# Patient Record
Sex: Male | Born: 1940 | Race: White | Hispanic: No | Marital: Single | Smoking: Former smoker
Health system: Southern US, Community
[De-identification: ages and names within clinical notes are randomized; demographics above are authoritative.]

## PROBLEM LIST (undated history)

## (undated) DIAGNOSIS — G8929 Other chronic pain: Secondary | ICD-10-CM

## (undated) DIAGNOSIS — I10 Essential (primary) hypertension: Secondary | ICD-10-CM

## (undated) DIAGNOSIS — I639 Cerebral infarction, unspecified: Secondary | ICD-10-CM

## (undated) DIAGNOSIS — R569 Unspecified convulsions: Secondary | ICD-10-CM

## (undated) DIAGNOSIS — M549 Dorsalgia, unspecified: Secondary | ICD-10-CM

## (undated) DIAGNOSIS — F32A Depression, unspecified: Secondary | ICD-10-CM

## (undated) DIAGNOSIS — S62502A Fracture of unspecified phalanx of left thumb, initial encounter for closed fracture: Secondary | ICD-10-CM

## (undated) DIAGNOSIS — F329 Major depressive disorder, single episode, unspecified: Secondary | ICD-10-CM

## (undated) DIAGNOSIS — C61 Malignant neoplasm of prostate: Secondary | ICD-10-CM

## (undated) DIAGNOSIS — J449 Chronic obstructive pulmonary disease, unspecified: Secondary | ICD-10-CM

## (undated) DIAGNOSIS — R0602 Shortness of breath: Secondary | ICD-10-CM

## (undated) HISTORY — DX: Fracture of unspecified phalanx of left thumb, initial encounter for closed fracture: S62.502A

## (undated) HISTORY — DX: Dorsalgia, unspecified: M54.9

## (undated) HISTORY — PX: LUMBAR SPINE SURGERY: SHX701

## (undated) HISTORY — DX: Other chronic pain: G89.29

## (undated) HISTORY — DX: Malignant neoplasm of prostate: C61

---

## 2004-07-28 ENCOUNTER — Encounter: Admission: RE | Admit: 2004-07-28 | Discharge: 2004-07-28 | Payer: Self-pay | Admitting: Internal Medicine

## 2004-08-24 ENCOUNTER — Ambulatory Visit (HOSPITAL_COMMUNITY): Admission: RE | Admit: 2004-08-24 | Discharge: 2004-08-24 | Payer: Self-pay | Admitting: Thoracic Surgery

## 2004-09-08 ENCOUNTER — Ambulatory Visit (HOSPITAL_COMMUNITY): Admission: RE | Admit: 2004-09-08 | Discharge: 2004-09-08 | Payer: Self-pay | Admitting: Thoracic Surgery

## 2004-10-12 ENCOUNTER — Inpatient Hospital Stay (HOSPITAL_COMMUNITY): Admission: RE | Admit: 2004-10-12 | Discharge: 2004-10-16 | Payer: Self-pay | Admitting: Thoracic Surgery

## 2004-10-12 ENCOUNTER — Encounter (INDEPENDENT_AMBULATORY_CARE_PROVIDER_SITE_OTHER): Payer: Self-pay | Admitting: *Deleted

## 2004-10-25 ENCOUNTER — Encounter: Admission: RE | Admit: 2004-10-25 | Discharge: 2004-10-25 | Payer: Self-pay | Admitting: Thoracic Surgery

## 2004-11-07 ENCOUNTER — Encounter: Admission: RE | Admit: 2004-11-07 | Discharge: 2004-11-07 | Payer: Self-pay | Admitting: Thoracic Surgery

## 2004-12-20 ENCOUNTER — Encounter: Admission: RE | Admit: 2004-12-20 | Discharge: 2004-12-20 | Payer: Self-pay | Admitting: Thoracic Surgery

## 2005-02-28 ENCOUNTER — Encounter: Admission: RE | Admit: 2005-02-28 | Discharge: 2005-02-28 | Payer: Self-pay | Admitting: Thoracic Surgery

## 2005-08-29 ENCOUNTER — Encounter: Admission: RE | Admit: 2005-08-29 | Discharge: 2005-08-29 | Payer: Self-pay | Admitting: Thoracic Surgery

## 2005-12-11 ENCOUNTER — Emergency Department (HOSPITAL_COMMUNITY): Admission: EM | Admit: 2005-12-11 | Discharge: 2005-12-11 | Payer: Self-pay | Admitting: Emergency Medicine

## 2006-06-14 ENCOUNTER — Ambulatory Visit: Admission: RE | Admit: 2006-06-14 | Discharge: 2006-09-04 | Payer: Self-pay | Admitting: Radiation Oncology

## 2006-09-18 ENCOUNTER — Ambulatory Visit (HOSPITAL_BASED_OUTPATIENT_CLINIC_OR_DEPARTMENT_OTHER): Admission: RE | Admit: 2006-09-18 | Discharge: 2006-09-18 | Payer: Self-pay | Admitting: Urology

## 2006-10-11 ENCOUNTER — Ambulatory Visit: Admission: RE | Admit: 2006-10-11 | Discharge: 2006-11-12 | Payer: Self-pay | Admitting: Radiation Oncology

## 2007-02-25 ENCOUNTER — Ambulatory Visit: Payer: Self-pay | Admitting: Surgery

## 2007-02-25 ENCOUNTER — Encounter (INDEPENDENT_AMBULATORY_CARE_PROVIDER_SITE_OTHER): Payer: Self-pay | Admitting: Internal Medicine

## 2007-02-25 ENCOUNTER — Ambulatory Visit (HOSPITAL_COMMUNITY): Admission: RE | Admit: 2007-02-25 | Discharge: 2007-02-25 | Payer: Self-pay | Admitting: Internal Medicine

## 2008-01-06 ENCOUNTER — Ambulatory Visit: Payer: Self-pay | Admitting: Internal Medicine

## 2008-01-06 ENCOUNTER — Inpatient Hospital Stay (HOSPITAL_COMMUNITY): Admission: EM | Admit: 2008-01-06 | Discharge: 2008-01-11 | Payer: Self-pay | Admitting: Emergency Medicine

## 2008-01-07 ENCOUNTER — Encounter: Payer: Self-pay | Admitting: Internal Medicine

## 2008-01-08 ENCOUNTER — Encounter: Payer: Self-pay | Admitting: Internal Medicine

## 2008-01-10 ENCOUNTER — Encounter: Payer: Self-pay | Admitting: Internal Medicine

## 2009-02-05 ENCOUNTER — Emergency Department (HOSPITAL_COMMUNITY): Admission: EM | Admit: 2009-02-05 | Discharge: 2009-02-05 | Payer: Self-pay | Admitting: Emergency Medicine

## 2009-02-08 ENCOUNTER — Encounter: Admission: RE | Admit: 2009-02-08 | Discharge: 2009-02-08 | Payer: Self-pay | Admitting: Neurosurgery

## 2009-02-28 ENCOUNTER — Ambulatory Visit: Payer: Self-pay | Admitting: Cardiovascular Disease

## 2009-02-28 ENCOUNTER — Inpatient Hospital Stay (HOSPITAL_COMMUNITY): Admission: EM | Admit: 2009-02-28 | Discharge: 2009-03-07 | Payer: Self-pay | Admitting: Emergency Medicine

## 2009-03-01 ENCOUNTER — Encounter: Payer: Self-pay | Admitting: Cardiology

## 2009-03-02 ENCOUNTER — Ambulatory Visit: Payer: Self-pay | Admitting: Cardiology

## 2009-03-02 ENCOUNTER — Encounter: Payer: Self-pay | Admitting: Cardiology

## 2009-03-15 ENCOUNTER — Encounter: Admission: RE | Admit: 2009-03-15 | Discharge: 2009-03-15 | Payer: Self-pay | Admitting: Neurosurgery

## 2009-04-07 ENCOUNTER — Encounter: Admission: RE | Admit: 2009-04-07 | Discharge: 2009-04-07 | Payer: Self-pay | Admitting: Neurosurgery

## 2009-06-30 ENCOUNTER — Encounter: Admission: RE | Admit: 2009-06-30 | Discharge: 2009-06-30 | Payer: Self-pay | Admitting: Neurosurgery

## 2009-07-19 ENCOUNTER — Inpatient Hospital Stay (HOSPITAL_COMMUNITY): Admission: EM | Admit: 2009-07-19 | Discharge: 2009-07-20 | Payer: Self-pay | Admitting: Internal Medicine

## 2009-11-12 IMAGING — CR DG CHEST 1V PORT
1 series · 1 of 1 positions shown · non-contrast
Comparison: 01/06/2008

CLINICAL DATA: GI bleeding.  Anemia.  Fall.  Coagulopathy due to
coumadin.  COPD.  No shortness of breath following breathing
treatment.

PORTABLE CHEST - 1 VIEW

[view not recorded]
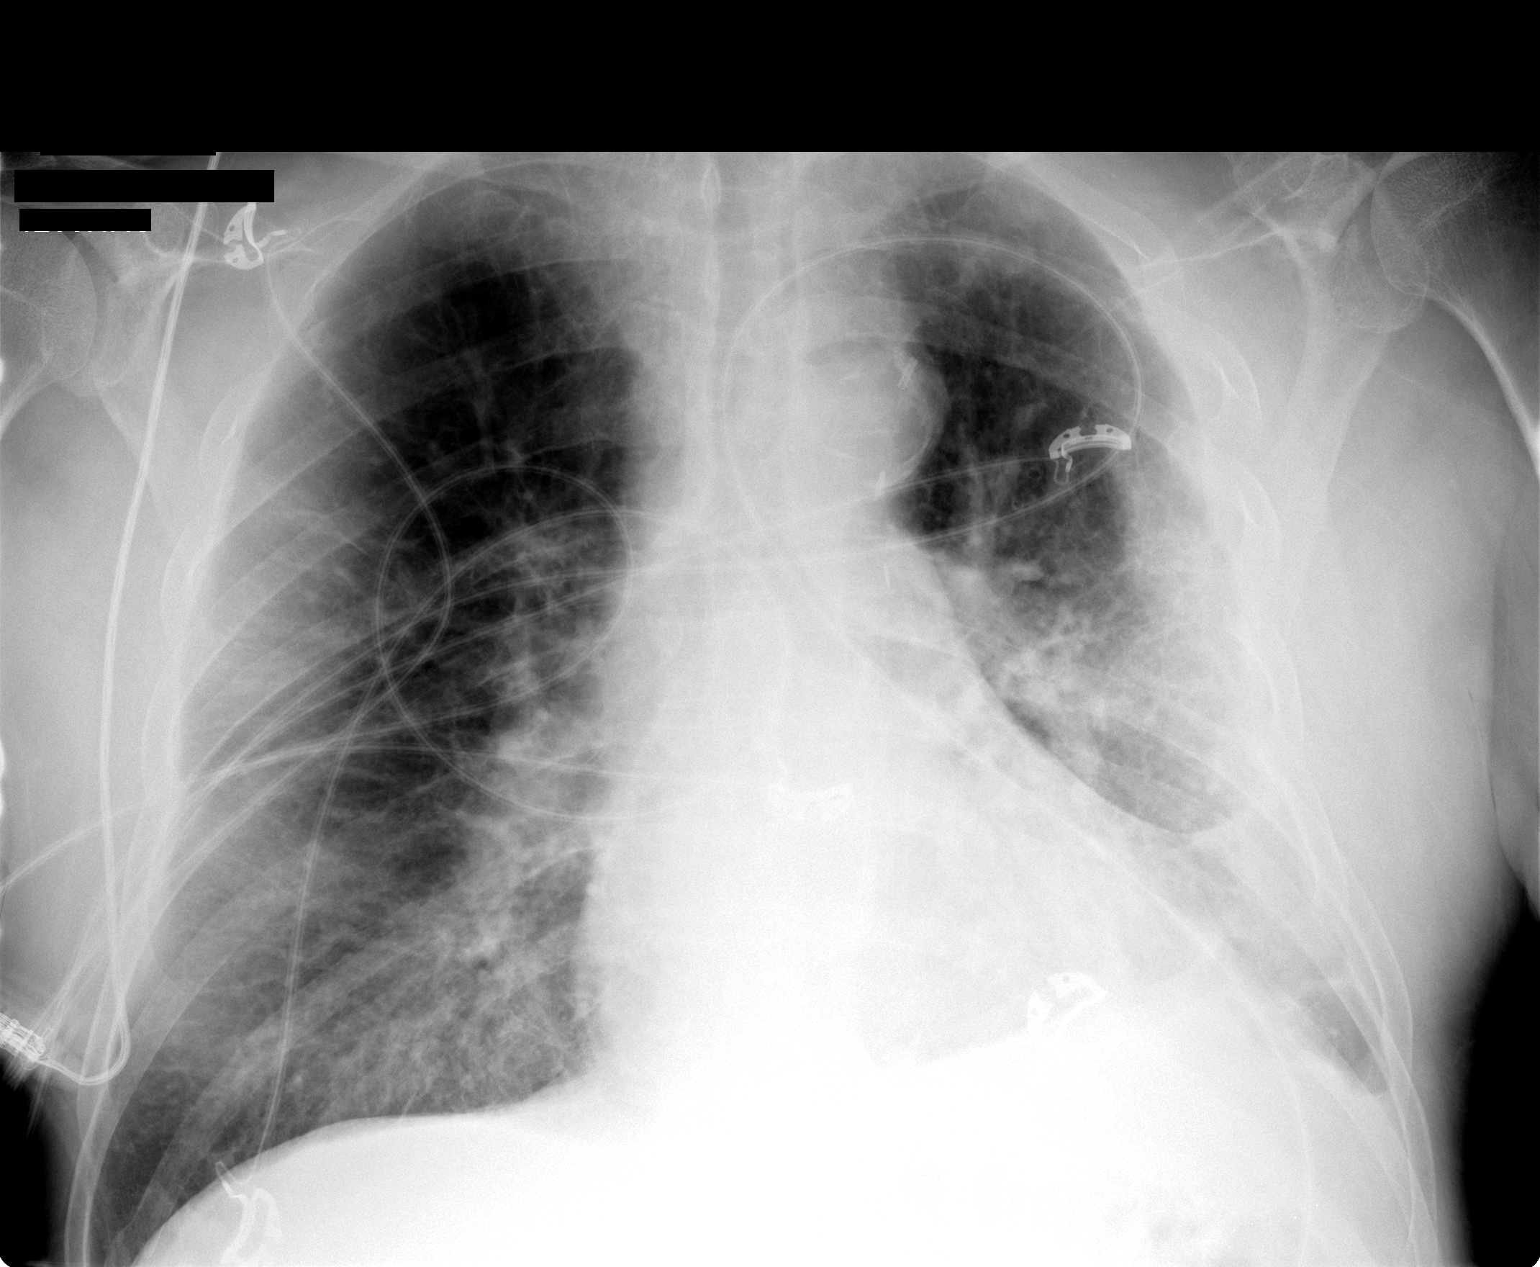

[1 of 1 positions shown; findings below may reference images not displayed]

FINDINGS: Lungs are hyperinflated.  Heart is enlarged.  No
pulmonary edema.  Patchy infiltrates are identified, increased
since prior study.  These are seen in the right lung base and left
lower lobe and left mid lung zone.  Small left pleural effusion is
identified.
IMPRESSION: 1.  COPD.
2.  Increased pulmonary infiltrates in the left lung and right
lower lobe.

## 2009-11-15 ENCOUNTER — Encounter: Admission: RE | Admit: 2009-11-15 | Discharge: 2009-11-15 | Payer: Self-pay | Admitting: Neurosurgery

## 2010-01-25 ENCOUNTER — Encounter: Payer: Self-pay | Admitting: Internal Medicine

## 2010-02-14 ENCOUNTER — Encounter: Payer: Self-pay | Admitting: Internal Medicine

## 2010-02-26 ENCOUNTER — Encounter: Payer: Self-pay | Admitting: Thoracic Surgery

## 2010-03-07 NOTE — Consult Note (Signed)
Summary: [facility]   Imported By: Roderic Ovens 03/04/2009 14:30:58  _____________________________________________________________________  External Attachment:    Type:   Image     Comment:   External Document

## 2010-03-09 NOTE — Letter (Signed)
Summary: Endoscopy Letter  [facility]  [address] [place], [place] [address]   Phone: [phone]  Fax: [phone]      February 14, 2010 MRN: [id number]   Willie Ward [address] [place], [place]  [address]   Dear Mr. BURNHAM,   According to your medical record, it is time for you to schedule an Endoscopy. Endoscopic screening is recommended for patients with certain upper digestive tract conditions because of associated increased risk for cancers of the upper digestive system.  This letter has been generated based on the recommendations made at the time of your prior procedure. If you feel that in your particular situation this may no longer apply, please contact our office.  Please call our office at [phone]) to schedule this appointment or to update your records at your earliest convenience.  Thank you for cooperating with [place] to provide you with the very best care possible.   Sincerely,  Hedwig Morton. Juanda Chance, M.D.  [facility] [phone]

## 2010-03-09 NOTE — Procedures (Signed)
Summary: Recall Assessment/[facility]  Recall Assessment/[facility]   Imported By: Sherian Rein 02/20/2010 15:49:38  _____________________________________________________________________  External Attachment:    Type:   Image     Comment:   External Document

## 2010-04-23 LAB — CBC
HCT: 33.5 % — ABNORMAL LOW (ref 39.0–52.0)
HCT: 36.9 % — ABNORMAL LOW (ref 39.0–52.0)
HCT: 38.6 % — ABNORMAL LOW (ref 39.0–52.0)
HCT: 41.7 % (ref 39.0–52.0)
Hemoglobin: 12.2 g/dL — ABNORMAL LOW (ref 13.0–17.0)
Hemoglobin: 12.7 g/dL — ABNORMAL LOW (ref 13.0–17.0)
Hemoglobin: 13.9 g/dL (ref 13.0–17.0)
MCHC: 32.7 g/dL (ref 30.0–36.0)
MCHC: 32.9 g/dL (ref 30.0–36.0)
MCHC: 33.3 g/dL (ref 30.0–36.0)
MCV: 111.1 fL — ABNORMAL HIGH (ref 78.0–100.0)
MCV: 112.4 fL — ABNORMAL HIGH (ref 78.0–100.0)
MCV: 111.2 fL — ABNORMAL HIGH (ref 78.0–100.0)
Platelets: 106 10*3/uL — ABNORMAL LOW (ref 150–400)
Platelets: 128 10*3/uL — ABNORMAL LOW (ref 150–400)
Platelets: 139 10*3/uL — ABNORMAL LOW (ref 150–400)
Platelets: 150 10*3/uL (ref 150–400)
RBC: 3.13 MIL/uL — ABNORMAL LOW (ref 4.22–5.81)
RBC: 3.28 MIL/uL — ABNORMAL LOW (ref 4.22–5.81)
RBC: 3.48 MIL/uL — ABNORMAL LOW (ref 4.22–5.81)
RBC: 3.71 MIL/uL — ABNORMAL LOW (ref 4.22–5.81)
RBC: 3.75 MIL/uL — ABNORMAL LOW (ref 4.22–5.81)
RDW: 13.6 % (ref 11.5–15.5)
RDW: 14.4 % (ref 11.5–15.5)
RDW: 15.1 % (ref 11.5–15.5)
WBC: 3.5 10*3/uL — ABNORMAL LOW (ref 4.0–10.5)
WBC: 3.9 10*3/uL — ABNORMAL LOW (ref 4.0–10.5)
WBC: 4.6 10*3/uL (ref 4.0–10.5)
WBC: 4.7 10*3/uL (ref 4.0–10.5)
WBC: 6.2 10*3/uL (ref 4.0–10.5)

## 2010-04-23 LAB — COMPREHENSIVE METABOLIC PANEL
ALT: 16 U/L (ref 0–53)
ALT: 29 U/L (ref 0–53)
ALT: 56 U/L — ABNORMAL HIGH (ref 0–53)
ALT: 26 U/L (ref 0–53)
AST: 18 U/L (ref 0–37)
AST: 190 U/L — ABNORMAL HIGH (ref 0–37)
AST: 38 U/L — ABNORMAL HIGH (ref 0–37)
AST: 46 U/L — ABNORMAL HIGH (ref 0–37)
AST: 87 U/L — ABNORMAL HIGH (ref 0–37)
Albumin: 2.5 g/dL — ABNORMAL LOW (ref 3.5–5.2)
Albumin: 2.5 g/dL — ABNORMAL LOW (ref 3.5–5.2)
Albumin: 2.7 g/dL — ABNORMAL LOW (ref 3.5–5.2)
Albumin: 2.8 g/dL — ABNORMAL LOW (ref 3.5–5.2)
Albumin: 3.4 g/dL — ABNORMAL LOW (ref 3.5–5.2)
Alkaline Phosphatase: 59 U/L (ref 39–117)
Alkaline Phosphatase: 61 U/L (ref 39–117)
Alkaline Phosphatase: 68 U/L (ref 39–117)
BUN: 3 mg/dL — ABNORMAL LOW (ref 6–23)
BUN: 3 mg/dL — ABNORMAL LOW (ref 6–23)
BUN: 6 mg/dL (ref 6–23)
BUN: 8 mg/dL (ref 6–23)
BUN: 5 mg/dL — ABNORMAL LOW (ref 6–23)
CO2: 29 mEq/L (ref 19–32)
CO2: 31 mEq/L (ref 19–32)
CO2: 32 mEq/L (ref 19–32)
CO2: 37 mEq/L — ABNORMAL HIGH (ref 19–32)
Calcium: 8.1 mg/dL — ABNORMAL LOW (ref 8.4–10.5)
Calcium: 8.3 mg/dL — ABNORMAL LOW (ref 8.4–10.5)
Calcium: 8.4 mg/dL (ref 8.4–10.5)
Calcium: 8.6 mg/dL (ref 8.4–10.5)
Calcium: 9 mg/dL (ref 8.4–10.5)
Calcium: 7.8 mg/dL — ABNORMAL LOW (ref 8.4–10.5)
Chloride: 100 mEq/L (ref 96–112)
Chloride: 107 mEq/L (ref 96–112)
Chloride: 93 mEq/L — ABNORMAL LOW (ref 96–112)
Chloride: 99 mEq/L (ref 96–112)
Creatinine, Ser: 0.89 mg/dL (ref 0.4–1.5)
Creatinine, Ser: 0.92 mg/dL (ref 0.4–1.5)
Creatinine, Ser: 1.22 mg/dL (ref 0.4–1.5)
Creatinine, Ser: 0.76 mg/dL (ref 0.4–1.5)
GFR calc Af Amer: 46 mL/min — ABNORMAL LOW (ref >60)
GFR calc Af Amer: >60 mL/min (ref >60)
GFR calc Af Amer: >60 mL/min (ref >60)
GFR calc Af Amer: >60 mL/min (ref >60)
GFR calc non Af Amer: >60 mL/min (ref >60)
GFR calc non Af Amer: >60 mL/min (ref >60)
GFR calc non Af Amer: >60 mL/min (ref >60)
GFR calc non Af Amer: >60 mL/min (ref >60)
GFR calc non Af Amer: >60 mL/min (ref >60)
GFR calc non Af Amer: >60 mL/min (ref >60)
Glucose, Bld: 109 mg/dL — ABNORMAL HIGH (ref 70–99)
Glucose, Bld: 98 mg/dL (ref 70–99)
Potassium: 2.7 mEq/L — CL (ref 3.5–5.1)
Potassium: 3 mEq/L — ABNORMAL LOW (ref 3.5–5.1)
Potassium: 3.4 mEq/L — ABNORMAL LOW (ref 3.5–5.1)
Potassium: 4.3 mEq/L (ref 3.5–5.1)
Potassium: 4.7 mEq/L (ref 3.5–5.1)
Sodium: 137 mEq/L (ref 135–145)
Sodium: 140 mEq/L (ref 135–145)
Sodium: 132 mEq/L — ABNORMAL LOW (ref 135–145)
Total Bilirubin: 1.1 mg/dL (ref 0.3–1.2)
Total Bilirubin: 1.1 mg/dL (ref 0.3–1.2)
Total Bilirubin: 1.2 mg/dL (ref 0.3–1.2)
Total Bilirubin: 1.6 mg/dL — ABNORMAL HIGH (ref 0.3–1.2)
Total Bilirubin: 2.3 mg/dL — ABNORMAL HIGH (ref 0.3–1.2)
Total Protein: 5.2 g/dL — ABNORMAL LOW (ref 6.0–8.3)
Total Protein: 5.4 g/dL — ABNORMAL LOW (ref 6.0–8.3)
Total Protein: 5.9 g/dL — ABNORMAL LOW (ref 6.0–8.3)

## 2010-04-23 LAB — BASIC METABOLIC PANEL
BUN: 5 mg/dL — ABNORMAL LOW (ref 6–23)
BUN: 3 mg/dL — ABNORMAL LOW (ref 6–23)
Chloride: 96 mEq/L (ref 96–112)
GFR calc Af Amer: >60 mL/min (ref >60)
GFR calc non Af Amer: >60 mL/min (ref >60)
GFR calc non Af Amer: >60 mL/min (ref >60)
Potassium: 3.5 mEq/L (ref 3.5–5.1)
Potassium: 3.8 mEq/L (ref 3.5–5.1)
Sodium: 135 mEq/L (ref 135–145)

## 2010-04-23 LAB — CK TOTAL AND CKMB (NOT AT ARMC)
CK, MB: 8.3 ng/mL (ref 0.3–4.0)
Relative Index: INVALID (ref 0.0–2.5)
Relative Index: INVALID (ref 0.0–2.5)
Total CK: 59 U/L (ref 7–232)
Total CK: 62 U/L (ref 7–232)

## 2010-04-23 LAB — DIFFERENTIAL
Basophils Absolute: 0 10*3/uL (ref 0.0–0.1)
Basophils Relative: 0 % (ref 0–1)
Eosinophils Absolute: 0 10*3/uL (ref 0.0–0.7)
Lymphocytes Relative: 3 % — ABNORMAL LOW (ref 12–46)
Monocytes Relative: 6 % (ref 3–12)
Neutrophils Relative %: 91 % — ABNORMAL HIGH (ref 43–77)

## 2010-04-23 LAB — POCT CARDIAC MARKERS: Myoglobin, poc: 111 ng/mL (ref 12–200)

## 2010-04-23 LAB — HEPATITIS PANEL, ACUTE
Hep B C IgM: NEGATIVE
Hepatitis B Surface Ag: NEGATIVE

## 2010-04-23 LAB — RAPID URINE DRUG SCREEN, HOSP PERFORMED
Opiates: POSITIVE — AB
Tetrahydrocannabinol: NOT DETECTED

## 2010-04-23 LAB — CARDIAC PANEL(CRET KIN+CKTOT+MB+TROPI)
CK, MB: 8.7 ng/mL (ref 0.3–4.0)
Relative Index: INVALID (ref 0.0–2.5)
Total CK: 64 U/L (ref 7–232)

## 2010-04-23 LAB — MAGNESIUM: Magnesium: 1.3 mg/dL — ABNORMAL LOW (ref 1.5–2.5)

## 2010-04-23 LAB — TROPONIN I: Troponin I: 1 ng/mL (ref 0.00–0.06)

## 2010-04-24 LAB — MRSA PCR SCREENING: MRSA by PCR: NEGATIVE

## 2010-06-20 NOTE — H&P (Signed)
Willie Ward, Willie Ward NO.:  [id number]   MEDICAL RECORD NO.:  [id number]          PATIENT TYPE:  INP   LOCATION:  1237                         FACILITY:  [facility]   PHYSICIAN:  Gwen Pounds, MD       DATE OF BIRTH:  1940/06/14   DATE OF ADMISSION:  01/05/2008  DATE OF DISCHARGE:                              HISTORY & PHYSICAL   PRIMARY CARE Kelyn Koskela:  w. Eric Form, M.D.   CHIEF COMPLAINT:  Very ill and orthostatic.   HISTORY OF PRESENT ILLNESS:  A 70 year old male with multiple medical  problems, currently his wife in the hospital with cancer of the liver in  room 1344.  He is very worried about by his wife.  He has not taken care  of himself for at least 3 weeks, minimal food, minimal water but has  been continuing to take his medications.  In the last 3 weeks, he has  had minimal to no reported alcohol.  In last one to one and half  weeks, he has had increased falls, orthostasis, headache, numbness, left  arm pain, left shoulder pain, and left elbow pain.  Because of  equilibrium and balance, he came to the ED.  He did take milk of  magnesia yesterday secondary to constipation and had diarrhea.  He hit  his head this afternoon and had a laceration and bleeding of the  forehead.  He got to the emergency department.  I was called for  inpatient admission due to him looking disheveled and ill.  He is  hypotensive.  He has got a bleeding laceration on the scalp.  He is in  renal failure.  He is anemic.  He has got a lower GI bleed and has  supratherapeutic INR.   PAST MEDICAL HISTORY:  1. Low back pain.  2. DVT.  3. Hypertension.  4. History of dysphagia secondary esophageal dilatation.  5. GERD.  6. Hiatal hernia.  7. COPD.  8. Former heavy smoker.  9. Prostate cancer status post treatment with Dr. Retta Diones.  10.L4 laminectomy.  11.History of rib surgery.   ALLERGIES:  No known drug allergies.   MEDICATION LIST:  Atenolol, Coumadin, lisinopril, Lasix,  albuterol  p.r.n., Prilosec p.r.n., and pain medication.   SOCIAL HISTORY:  He is married, 4 children.  He quit tobacco after 25  years, minimal alcohol.   FAMILY HISTORY:  Mother died of old age.  Father died of bowel  obstruction.   REVIEW OF SYSTEMS:  Urine output is stable, but he has to sit down to  urinate and does have markedly decreased stream and he says his urine  output over the last couple of days has been okay.  Please see HPI for  further details.  He denies any recent overdosing if ibuprofen.  No  blood in the stool.  No fevers, no chills, no other symptoms are noted,  except he has not been able to care for himself.   PHYSICAL EXAMINATION:  VITAL SIGNS:  Blood pressure 77/41 and 81/57,  heart rate 80, respiratory rate 18, and saturating 97% on  1 L nasal  cannula.  GENERAL:  He is alert and oriented, which is very surprising.  HEENT:  He has got very poor dentition.  His forehead is bloody and has  a dressing.  Tongue is very dry.  NECK:  No JVD.  PULMONARY:  Clear to auscultation bilaterally with end-respiratory  wheeze.  CARDIAC:  Regular without murmur.  ABDOMEN:  Soft.  He reports mild umbilical tenderness.  EXTREMITIES:  No edema.  He is moving all 4.  He has got some back scars  noted.   ANCILLARY DATA:  White count 4.6, hemoglobin 8.5, and platelet count  246.  Sodium 136, potassium 3.3, chloride 92, bicarb not drawn, BUN 77,  creatinine 3.1, and glucose 95.  Alcohol less than 0.5.  INR greater  than 9.8.  Occult blood is positive.  Chest x-ray shows COPD, history of  left rib and surgical changes, calcified pleural plaques, otherwise  clear.  Cranial CT; no acute intracranial abnormality, atrophy, left  maxillary sinusitis, and cervical disk change is noted.   ASSESSMENT AND PLAN:  This is a 70 year old man who is disheveled,  looked much older than stated age, being admitted with internal  gastrointestinal bleed, upper or versus lower hard to determine  at this  point.  Acute renal failure, hypotension, orthostasis, failure to  thrive, dehydration, supratherapeutic INR, and not caring for self.  Denies suicidal tendencies.  He denies any current alcohol and he is  critically ill and will be admitted to the ICU.  Baseline BUN and  creatinine are 6 and 0.9 respectively dated September 11, 2006.   PLAN:  1. Admit to ICU.  2. Aggressive blood and volume repletion.  3. Reverse of Coumadin.  4. Start Zoloft in 1 to 2 days.  5. K-Dur 20 mEq p.o. x1.  6. Proton pump inhibition.  7. Keep the patient warm, he is very cold.  8. Follow labs.  9. Keep forehead dressed.  10.Empiric Rocephin x1 secondary to poor teeth and question sinusitis      on the CT scan.  11.Nebulizer treatment.  12.Oxygen.      Gwen Pounds, MD  Electronically Signed     JMR/MEDQ  D:  01/06/2008  T:  01/06/2008  Job:  [id number]   cc:   Dr. Clelia Croft

## 2010-06-20 NOTE — Op Note (Signed)
Willie Ward, Willie Ward               ACCOUNT NO.:  [id number]   MEDICAL RECORD NO.:  [id number]          PATIENT TYPE:  AMB   LOCATION:  NESC                         FACILITY:  [facility]   PHYSICIAN:  Bertram Millard. Dahlstedt, M.D.DATE OF BIRTH:  Jun 26, 1940   DATE OF PROCEDURE:  09/18/2006  DATE OF DISCHARGE:                               OPERATIVE REPORT   PREOPERATIVE DIAGNOSIS:  Adenocarcinoma prostate.   POSTOPERATIVE DIAGNOSIS:  Adenocarcinoma prostate.   PROCEDURE:  Placement of I-125 seeds, cystoscopy.   SURGEON:  Bertram Millard. Dahlstedt, M.D.   RADIATION ONCOLOGIST:  Artist Pais. Kathrynn Running, M.D.   ANESTHESIA:  General with LMA.   COMPLICATIONS:  None.   ESTIMATED BLOOD LOSS:  Minimal.   BRIEF HISTORY:  A 70 year old male who was diagnosed this spring with  adenocarcinoma of the prostate.  The initial PSA was 6.5, he had a  Gleason score 3+4.  He has completed external beam radiotherapy and  presents at this time for brachytherapy for completion of combination  radiotherapy.  Risks and complications of the procedure have been  discussed with the patient.  He understands these and desires to  proceed.   DESCRIPTION OF PROCEDURE:  The patient was identified in the holding  area, preoperative IV antibiotics were administered, he is taken to the  operating room where general anesthetic was administered.  Placed in  dorsal lithotomy position.  Genitalia and perineum were prepped and  draped.  Scrotum was retracted superiorly.  A Foley catheter was placed  in the bladder and the balloon was filled with contrast containing  fluid.  Rectal tube was placed.  The patient was draped.  The  transrectal ultrasound probe was then placed and the prostate was  ultrasounded.  The rectum, urethra, prostatic outlines were made.  The  treatment plan was then completed.  At this point, the needle grid was  placed on the patient's perineum.  The prostate was transfixed with  holding needles.  The seeds  were then implanted using the Nucletron  device.  A total of 18 needles were used.  Please see the treatment plan  for specifics.  Following placement of all 18 needles and the  corresponding seeds, the fixation needles were removed, the bladder  catheter removed (the balloon had been deflated by a needle), and the  bladder was inspected.  Urethra and bladder were free of seeds.  There  was one seed within the Foley balloon which was extracted.   The patient tolerated procedure well.  He was awakened and taken to PACU  in stable condition.      Bertram Millard. Dahlstedt, M.D.  Electronically Signed     SMD/MEDQ  D:  09/18/2006  T:  09/19/2006  Job:  [id number]

## 2010-06-20 NOTE — Discharge Summary (Signed)
[name], Willie               ACCOUNT NO.:  [id number]   MEDICAL RECORD NO.:  [id number]          PATIENT TYPE:  INP   LOCATION:  1505                         FACILITY:  [facility]   PHYSICIAN:  Mark A. Perini, M.D.   DATE OF BIRTH:  26-Mar-1940   DATE OF ADMISSION:  01/05/2008  DATE OF DISCHARGE:  01/11/2008                               DISCHARGE SUMMARY   PRIMARY CARE PHYSICIAN:  Kari Baars, M.D.   DISCHARGE DIAGNOSES:  1. Acute gastrointestinal bleeding secondary to supratherapeutic      anticoagulation.  2. Chronic obstructive pulmonary disease.  3. Hypotension due to hypovolemic shock requiring over 12 liters of      fluid resuscitation and he was placed on a prednisone taper for a      possible slight relative adrenal insufficiency during this episode.  4. Alcoholism with delirium.  However, significant alcohol withdrawal      was managed with Ativan protocol.  5. History of deep vein thrombosis but now his Coumadin is completely      discontinued.  6. Hypertension.  7. Hypokalemia, resolved.  8. Protein calorie malnutrition hypoalbuminemia on diet supplements.  9. Anemia of blood loss.  10.History of low back pain.  11.Gastroesophageal reflux.  12.Gastritis.  13.History of prostate cancer.   PROCEDURES:  1. GI consultation.  2. Transfusion of 2 units of packed red blood cells.  3. Upper endoscopy which showed acute gastritis, mild antral      gastritis, no varices and no definite sign of recent bleeding on      January 07, 2008.  4. CT scan of the head on January 06, 2008, showed no acute      intracranial abnormality.  There is moderate cortical atrophy and      mild changes of small vessel disease.  There is chronic left      maxillary sinusitis.  5. CT of the cervical spine on January 06, 2008, showed no fractures.      There was degenerative disk disease, spondylosis and facet      degenerative changes and mild spinal stenosis at the C5-6 level      with  multilevel foraminal stenosis.  6. The ultrasound of the abdomen on January 06, 2008, showed      cholelithiasis and layering sludge without other ultrasound      evidence of cholecystitis or biliary obstruction.  There is a      nonspecific 13 mm hypoechoic liver lesion similar to findings on a      previous CT scan dating back to June of 2006 suggesting a benign      etiology and he had no hydronephrosis.  7. Colonoscopy.   DISCHARGE MEDICATIONS:  1. He is to stop Coumadin.  2. He is resume atenolol at his previous home dose which I believe is      100 mg daily.  3. He is resume lisinopril but only take one half of a 40 mg pill      daily.  4. He is to not resume Lasix or furosemide until he discusses this  further with Dr. Clelia Croft in the office in the next few days.  5. He is to take albuterol inhaler 2 puffs up to four times a day as      needed for shortness of breath, wheezing or coughing.  6. He is to take Atrovent inhaler 2 puffs twice a day every day.  7. He is to use Anusol-HC suppository 25 mg per rectum up to twice a      day as needed for hemorrhoids.  8. He is to take generic Protonix 40 mg once daily indefinitely.  9. He is to take over-the-counter multivitamin daily.  10.Over-the-counter ferrous sulfate 325 mg once daily.  11.Over-the-counter [employer] daily.  12.He is to take prednisone taper for 2 days and then discontinue      this.  13.He is to do an Ativan taper for the next 2-3 days and then      discontinue this.  14.The prednisone is just 10 mg daily for the next 2 days and then      stop.  15.Ativan is 1 mg 1 pill twice a day for 1 day then one 1 mg 1 pill      daily for 2 days and then stop.  16.He may use over-the-counter Tylenol 500 mg up to four times a day      as needed for aches and pains.   HISTORY OF PRESENT ILLNESS:  Willie Ward is a pleasant 70 year old gentleman  who presented with decreased oral intake and increased alcohol use.   He  had had some falls, orthostasis, headache and numbness and presented to  the emergency room and was found to have evidence of GI bleeding with  occult positive stool and significant anemia and hypotension and he was  also in acute renal failure.   HOSPITAL COURSE:  Rondey was admitted to the intensive care unit.  He was  treated with fluid resuscitation and packed red cells.  He was placed on  alcohol withdrawal protocol.  He was given rally packs.  His Coumadin  was discontinued.  Cortisol level was 17.8 and Solu-Cortef was added for  possible mild relative adrenal insufficiency in the face of his illness.  In the first 2 days of his admission his blood pressures ranged in the  70s to 90s.  He was actually transfused I believe a total of 3 units of  packed red cells this admission.  An EGD was performed which showed  gastritis.  He was treated with albuterol and Atrovent nebulizers as  well.  By January 07, 2008, he had significant delirium but was on a  scheduled Ativan per CIWA protocol.  Furthermore, he did have a  colonoscopy on January 08, 2008, which showed colon polyps and internal  hemorrhoids.  On presentation his INR was greater than 10, but this  improved.  He was treated with intravenous vitamin K upon admission as  well.  His BUN and creatinine gradually returned to normal with fluid  resuscitation.  On January 09, 2008, he was transferred out of the  intensive care unit to a medical bed.  There he was given potassium for  a low potassium level and continued on the Ativan taper.  At that point  he remained stable and on January 11, 2008, he was deemed stable for  discharge home.   DISCHARGE PHYSICAL EXAM:  VITAL SIGNS:  Temperature 98, afebrile, pulse  61, respiratory rate 18, blood pressure 150/80, 98% saturation on room  air.  GENERAL:  He was ambulating well and he was sitting in no acute distress  on exam.  LUNGS:  Lungs were clear to auscultation bilaterally with  no wheezes,  rales or rhonchi.  HEART:  Heart was regular rate and rhythm with no murmur or gallop.  ABDOMEN:  Abdomen was soft, nontender, nondistended.  He had one to two  plus bilateral ankle and foot edema with some chronic venous  insufficiency changes of the skin noted on both legs.   DISCHARGE LABORATORY DATA:  White count 4.3 with a normal differential,  hemoglobin 8.8, platelet count 112,000, sodium 145, potassium 3.5,  chloride 112, CO2 28, BUN 7, creatinine 0.75, glucose 90.  Liver tests  were normal with the exception of an AST of 48 and an albumin of 2.3,  INR was 1.4.  Other notable laboratory tests, his Helicobacter pylori  CLO-test from his EGD was urease negative.  Urine culture from January 06, 2008, was negative.  Blood culture from January 06, 2008, remained  negative   DISCHARGE INSTRUCTIONS:  Gerod is to follow a low salt diet.  He is to  increase his activity slowly.  He is to call if he has any recurrent  problems or return to the emergency room if he has any significant  problems.  He is to call our office for a followup visit with Dr. Clelia Croft  in 3-4 days to recheck his blood work and to check on his overall  status.  He is to follow his medical regimen carefully and completely  stop using alcohol.  We do recommend AA as well.  We strongly encouraged  him to check in with the outpatient [facility] substance abuse program but he  says he probably will not do so now. He seems open to attending AA.  We  have given him the proper numbers and directions to this facility.      Mark A. Perini, M.D.  Electronically Signed     MAP/MEDQ  D:  01/11/2008  T:  01/11/2008  Job:  [id number]

## 2010-06-23 NOTE — H&P (Signed)
Willie Ward, SHULER               ACCOUNT NO.:  [id number]   MEDICAL RECORD NO.:  [id number]          PATIENT TYPE:  INP   LOCATION:  NA                           FACILITY:  [facility]   PHYSICIAN:  Ines Bloomer, M.D. DATE OF BIRTH:  May 22, 1940   DATE OF ADMISSION:  DATE OF DISCHARGE:                                HISTORY & PHYSICAL   CHIEF COMPLAINT:  Left lung mass.   HISTORY OF PRESENT ILLNESS:  A 70 year old patient has a long history of  tobacco abuse.  He had a chest x-ray which showed a pleural-based cystic  mass.  CT scan was done and there was thought to be mass that was 43 x 33 in  the left posterior area that was thought to be possibly be a benign tumor or  fibroma.  He has a history of smoking and a questionable history of asbestos  in the past.  Had no weight loss or hemoptysis, excessive sputum.   PAST MEDICAL HISTORY:  He has no allergies.   MEDICATIONS:  1.  Avastin 30 mg a day.  2.  Lisinopril 40 mg a day.  3.  Atenolol 100 mg a day.  4.  Percocet as needed.   He has been treated for emphysema, hypercholesterolemia, and hypertension.   FAMILY HISTORY:  Positive for vascular disease, negative for cancer.   SOCIAL HISTORY:  He is a retired Personnel officer.  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  He  continues to smoke.  Has tried to quit.  Smokes at least one pack a day and  has occasional use of alcohol.   REVIEW OF SYSTEMS:  He is 189 pounds and 6 feet.  Pulmonary function tests  showed an FEC of 2.78 with an FEV1 of 1.78.  CARDIAC:  No history of angina  or atrial arrhythmias.  PULMONARY:  He has problems with wheezing.  GI:  He  has a hiatal hernia.  No nausea, vomiting, or constipation.  GU:  No dysuria  or frequent urination.  VASCULAR:  No claudication, TIAs, or DVT.  NEUROLOGIC:  No headaches.  ORTHOPEDIC:  He has no back and chest pain.  PSYCHIATRIC:  He has been treated for nervousness in the past.  HEENT:  No  change in his eye sight or hearing.  HEMATOLOGICAL:  No  history of angina.   PHYSICAL EXAMINATION:  GENERAL:  He is a well-developed male in no acute  distress.  VITAL SIGNS:  Blood pressure 130/70, pulse 60, respirations 16, O2  saturations 96%.  HEENT:  Head is atraumatic.  Eyes:  Pupils are equal, reactive to light and  accommodation.  Extraocular movements are normal.  Ears:  Tympanic membranes  intact.  Nose:  No septal deviation.  Throat without lesion.  NECK:  Supple without thyromegaly.  There is no supraclavicular or axillary  adenopathy.  CHEST:  Clear to auscultation and percussion.  HEART:  Regular sinus rhythm.  No murmurs.  ABDOMEN:  Soft.  No hepatosplenomegaly.  There is a question of mild  tenderness on the left side at the fifth intracostal space at the mid  axillary line.  Abdomen is obese.  EXTREMITIES:  Pulses are 2+.  There is no clubbing or edema.  NEUROLOGIC:  He is oriented x3.  Sensory and motor intact.  Cranial nerves  are intact.  SKIN:  Without lesion.   IMPRESSION:  1.  Left pleural-based mass.  2.  Hypercholesterolemia.  3.  Emphysema.  4.  Hypertension.   PLAN:  Left VATS and resection of pleural tumor.       DPB/MEDQ  D:  09/05/2004  T:  09/05/2004  Job:  [id number]

## 2010-06-23 NOTE — Op Note (Signed)
NAMELUCCAS, TOWELL               ACCOUNT NO.:  [id number]   MEDICAL RECORD NO.:  [id number]          PATIENT TYPE:  INP   LOCATION:  2899                         FACILITY:  [facility]   PHYSICIAN:  Ines Bloomer, M.D. DATE OF BIRTH:  1940-03-14   DATE OF PROCEDURE:  DATE OF DISCHARGE:                                 OPERATIVE REPORT   PREOPERATIVE DIAGNOSIS:  Left pleural-based mass.   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   OPERATION PERFORMED:  Left video-assisted thoracic surgery, resection of  pleural myxomatous tumor with resections of the fourth and fifth ribs and en  bloc resection, resection with a 2-mm Gore-Tex patch.   SURGEON:  Ines Bloomer, M.D.   FIRST ASSISTANT:  Pecola Leisure, P.A.-C.   ANESTHESIA:  General.   PROCEDURE:  After percutaneous insertion of all monitoring lines, the  patient underwent general anesthesia, was prepped and draped in the usual  sterile manner, was turned to the left lateral thoracotomy position, and  dual-lumen tube was inserted.  A trocar was inserted, and the lesion could  be seen in the fourth and fifth ribs at the posterior axillary line.  A 7-cm  incision was made over the fifth intercostal space, and dissection was  carried down partially dividing the latissimus and reflecting the serratus  superiorly at the triangle of auscultation.  When this was then done, we  then realized where the lesion was by looking at it with the scope and got  inferior margins along the inferior portion of the fifth intercostal space.  We were able then to palpate the medial margin and the lateral margins using  scope as guidance.  The soft tissue was divided posteriorly, and we  partially divided the paraspinous muscles, and the intercostal muscles were  taken for the fifth and fourth ribs, and then these ribs were cut  posteriorly.  An incision was made anteriorly along the ribs and then  cut  anteriorly.  After an en bloc resection was done, it appeared that the  medial margins were about only a cm, so another cm of rib was taken  medially.  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  A 10 x 15 Gore-Tex 2-mm patch was then  used or reconstruction and 0 Prolene was placed around the third rib and the  sixth ribs superiorly and inferiorly and then laterally around the soft  tissue medially and laterally, and then it was placed through the patch, and  the patch was tied in place.  The patch was very tight and seemed to have  good closure of the space.  Then, ON-  Q catheters were placed over the patch in the usual fashion.  Two chest  tubes were brought into the trocar sites and tied in place with 0 silk.  The  chest muscle was closed with interrupted #1 Vicryl and 2-0 Vicryl in the  subcutaneous tissue and 3-0 Vicryl as a subcuticular stitch.  The patient  returned to the recovery room in stable condition.  ______________________________  Ines Bloomer, M.D.     DPB/MEDQ  D:  10/12/2004  T:  10/12/2004  Job:  [id number]   cc:   Kari Baars, M.D.  [address]  [place], [place] [address]  Fax: [phone]

## 2010-06-23 NOTE — Discharge Summary (Signed)
Willie Ward, Willie Ward               ACCOUNT NO.:  [id number]   MEDICAL RECORD NO.:  [id number]          PATIENT TYPE:  INP   LOCATION:  3307                         FACILITY:  [facility]   PHYSICIAN:  Ines Bloomer, M.D. 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   SECONDARY DIAGNOSES:  1.  Tobacco abuse.  2.  Hypertension.  3.  Hyperlipidemia.  4.  Emphysema.   ALLERGIES:  No known drug allergies.   IN-HOSPITAL OPERATIONS AND PROCEDURES:  Left video-assisted thoracoscopic  surgery with resection of left second and third ribs with mass and repair  with Hemashield patch.   HISTORY AND PHYSICAL/HOSPITAL COURSE:  Willie Ward is a 70 year old patient  who has a long history of tobacco abuse. He has a past medical history of  emphysema, hypercholesterolemia, and hypertension. Chest x-ray showed a left  pleural based mass. CT scan showed a left pleural mass of 42 x 23 in the  left posterior area thought to be a benign tumor or fibroma. He has a  history of smoking in the past and also a history of asbestos. He has had no  weight loss, hemoptysis, or excessive spitting. For details of patient's  past medical history and physical exam, please see dictated history and  physical.   HOSPITAL COURSE:  Willie Ward is taken to the operating room on October 12, 2004, where he underwent a left-video assisted thoracoscopic surgery with  resection of left 2nd and 3rd ribs with mass lesion. He also had repair of  his Hemashield patch. The patient tolerated the procedure well and  transferred to the intensive care unit in stable condition. Pathology report  showed the mass to be __________sarcoma. Following surgery the patient was  seen to be stable.  The patient's postoperative course is pretty much  unremarkable. On postoperative day one,  the patient was stable with H&H 15.6  and 45.7.  Chest x-ray was stable with no air leak noted and minimal  drainage from chest tube. Anterior chest tube was discontinued. On  postoperative day two the patient continued to progress stable. Chest x-ray  was stable. No air leak was noted. The remaining chest tube was discontinued  as well as Foley. The patient was out of bed, ambulating well. Incisions  were dry, intact, and healing well.   The patient was discharged to home on October 16, 2004, postoperative day  four. The patient was stable in condition. Chest x-ray was stable at  discharge, on pneumothorax or adhesio noted. He was saturating at 97% on  room air. Incision was dry, intact, and healing well. The patient ambulating  well. Appetite continuing to improve. The patient is afebrile.   A follow-up appointment will be scheduled with Dr. Edwyna Shell in one week. The  patient will obtain a PA and lateral chest x-ray one hour prior to this  appointment. Willie Ward received instructions on diet, activity level, and  incisional care. He is told  no driving until released to do so, and no heavy  lifting over 10 pounds. He is told he is allowed to shower washing  his  incisions using soap and water. He is to contact the office if he develops  any drainage or opening from any of his incision sites. The patient  acknowledged understanding. He was educated on diet to be low fat, low salt.  He is told to ambulate three to four times for per day, progress as  tolerated. He is also told to continue his breathing exercises. He again  acknowledges understanding.   DISCHARGE MEDICATIONS:  1.  Atenolol 100 mg p.o. daily.  2.  Lisinopril 40 mg p.o. daily.  3.  Albuterol p.r.n.  4.  Prilosec p.r.n.  5.  Valium 10 mg daily.  6.  Tylox one to two tabs p.o. q.4-6h. p.r.n. pain.      Theda Belfast, PA    ______________________________  Ines Bloomer, M.D.    KMD/MEDQ  D:  10/24/2004   T:  10/25/2004  Job:  [id number]

## 2010-06-23 NOTE — H&P (Signed)
NAMEALISTER, Willie Ward               ACCOUNT NO.:  [id number]   MEDICAL RECORD NO.:  [id number]          PATIENT TYPE:  INP   LOCATION:  NA                           FACILITY:  [facility]   PHYSICIAN:  Ines Bloomer, M.D. DATE OF BIRTH:  January 13, 1941   DATE OF ADMISSION:  DATE OF DISCHARGE:                                HISTORY & PHYSICAL   CHIEF COMPLAINT:  Left lung mass.   HISTORY OF PRESENT ILLNESS:  This is a 70 year old patient who has a long  history of tobacco abuse.  Chest x-ray showed a left pleural-based mass.  CT  scan showed a left pleural mass that was 43 x 23 in the left posterior area  thought to be a benign tumor or fibroma.  He has a history of smoking in the  past and industrial history of asbestos.  He has had no weight loss,  hemoptysis, or excessive sputum.   PAST MEDICAL HISTORY:  No allergies.   MEDICATIONS:  1.  Avastin 30 mg a day.  2.  Lisinopril 40 mg a day.  3.  Atenolol 100 mg a day.  4.  Percocet as needed.   He has been treated for emphysema, hypercholesterolemia, and hypertension.   FAMILY HISTORY:  Positive for vascular disease.   SOCIAL HISTORY:  He is a retired Personnel officer.  He has 4 children.  Continues  to smoke and tries to quit.  Occasional use of alcohol.   REVIEW OF SYSTEMS:  He is 185 pounds.  He is 6 feet.  Pulmonary function  test showed an FVC of 2.78, FEV 1 of 1.78.  CARDIAC: No history of angina or  atrial arrhythmias.  PULMONARY:  He has problem with wheezing and no  hemoptysis.  GI:  He has a hiatal hernia.  No nausea, vomiting, or  constipation.  GU: No dysuria or frequent urination.  No kidney disease or  kidney stone. VASCULAR: No claudication, TIA, DVT.  NEUROLOGIC: No headaches  or seizures. ORTHOPEDIC: No back pain, no joint pain.  PSYCHIATRIC:  He has  been treated for nervousness in the past.  EYE/ENT: No changes in eyesight  or hearing.  HEMATOLOGIC:  No anemia.   PHYSICAL EXAMINATION:  VITAL SIGNS:  Blood pressure  130/70, pulse 60,  respirations 18, O2 saturation 96%.  HEAD:  Atraumatic.  EYES: Pupils equal, round, and reactive to light and accommodation.  Extraocular movements are normal.  EARS:  Tympanic membranes are intact.  NOSE:  No septal deviation.  THROAT:  Without lesion.  NECK:  Supple with no thyromegaly, no supraclavicular or axillary  adenopathy.  CHEST: Clear to auscultation and percussion.  HEART: Regular sinus rhythm.  No murmurs.  ABDOMEN:  Soft. There is no hepatosplenomegaly.  EXTREMITIES:  Pulses are 2+.  There is no clubbing or edema.  NEUROLOGIC: Oriented x3.  Sensory and motor intact.  Cranial nerves are  intact.  SKIN:  Without lesion.   IMPRESSION:  1.  Left pleural based mass.  2.  Hypercholesterolemia.  3.  Chronic obstructive pulmonary disease.  4.  Hypertension.   PLAN:  VATS resection  of left pleural tumor.           ______________________________  Ines Bloomer, M.D.     DPB/MEDQ  D:  10/10/2004  T:  10/10/2004  Job:  [id number]

## 2010-08-19 ENCOUNTER — Encounter: Payer: Self-pay | Admitting: Family Medicine

## 2010-08-23 ENCOUNTER — Ambulatory Visit (INDEPENDENT_AMBULATORY_CARE_PROVIDER_SITE_OTHER): Payer: Medicare Other | Admitting: Family Medicine

## 2010-08-23 ENCOUNTER — Encounter: Payer: Self-pay | Admitting: Family Medicine

## 2010-08-23 DIAGNOSIS — F411 Generalized anxiety disorder: Secondary | ICD-10-CM

## 2010-08-23 DIAGNOSIS — M545 Low back pain: Secondary | ICD-10-CM

## 2010-08-23 DIAGNOSIS — Z8546 Personal history of malignant neoplasm of prostate: Secondary | ICD-10-CM

## 2010-08-23 DIAGNOSIS — G8929 Other chronic pain: Secondary | ICD-10-CM

## 2010-08-23 MED ORDER — LEVETIRACETAM ER 500 MG PO TB24: 500.0000 mg | ORAL_TABLET | Freq: Every day | ORAL | Status: DC

## 2010-08-23 MED ORDER — FLUOXETINE HCL (PMDD) 20 MG PO TABS: ORAL_TABLET | ORAL | Status: DC

## 2010-08-23 MED ORDER — ALPRAZOLAM 1 MG PO TABS: 1.0000 mg | ORAL_TABLET | Freq: Two times a day (BID) | ORAL | Status: DC | PRN

## 2010-08-23 NOTE — Progress Notes (Signed)
Subjective:    Patient ID: Willie Ward, male    DOB: Sep 28, 1940, 70 y.o.   MRN: [id number]  HPI Hx of one seizure. Was on diazepam 3- 4 tabs daily for several years for his back pain as well his anxiety. At one point in time he decided not to fill them because didn't have the money. He did have a drinking problem years ago as well. He no longer drinks alcohol.  Then had a seizure after he ran out of his Valium. He was admitted to the hospital for seizure..  The hosp put him on xanax 2.5mg   Then went to [facility] and saw a psychiatrist.  Then he was put on klonopin.  He has not been as happy with Klonopin and says he prefers Xanax or Valium. He has recently been trying to wean off of the Klonopin in hopes of getting back onto Xanax or Valium he has been off the Klonopin for the last 2 days. He was previously taking 1 mg twice a day but has weaned down over the last month.   He would also like to get off the Keppra. I'm not exactly clear when this was started. He says it was not started at the end after the initial seizure years ago. It sounds like it was started more recently. I don't know if this was to just help him from having a seizure as he was coming off of the Klonopin. This is a little unclear to me. I recommended that for now he needs to continue medication I did give her refill at least until I get his records.  Review of Systems  Constitutional: Negative for fever, diaphoresis and unexpected weight change.  HENT: Negative for hearing loss, rhinorrhea, sneezing and tinnitus.   Eyes: Negative for visual disturbance.  Respiratory: Positive for cough. Negative for wheezing.   Cardiovascular: Negative for chest pain and palpitations.  Gastrointestinal: Negative for nausea, vomiting, diarrhea and blood in stool.  Genitourinary: Negative for dysuria and discharge.       Breast lump  Musculoskeletal: Negative for myalgias and arthralgias.  Skin: Negative for rash.  Neurological: Positive for  headaches.  Hematological: Positive for adenopathy. Bruises/bleeds easily.  Psychiatric/Behavioral: Positive for sleep disturbance. Negative for dysphoric mood. The patient is not nervous/anxious.        BP 137/85  Pulse 96  Ht 6' (1.829 m)  Wt 145 lb (65.772 kg)  BMI 19.67 kg/m2  SpO2 96%    Not on File  Past Medical History  Diagnosis Date  . Chronic back pain     No past surgical history on file.  History   Social History  . Marital Status: Single    Spouse Name: N/A    Number of Children: N/A  . Years of Education: GED   Occupational History  . Disabled    Social History Main Topics  . Smoking status: Current Everyday Smoker -- 1.5 packs/day    Types: Cigarettes  . Smokeless tobacco: Not on file  . Alcohol Use: 14.4 oz/week    24 Cans of beer per week  . Drug Use: No  . Sexually Active: No   Other Topics Concern  . Not on file   Social History Narrative   1 caffeine drink per day. No exercise. Former alcoholic    Family History  Problem Relation Age of Onset  . Heart attack Mother   . Depression Son   . Diabetes Sister   . Hypertension Mother   .  Hypertension Brother     Willie Ward does not currently have medications on file.  Objective:   Physical Exam        Assessment & Plan:

## 2010-08-24 ENCOUNTER — Encounter: Payer: Self-pay | Admitting: Family Medicine

## 2010-08-24 DIAGNOSIS — Z8546 Personal history of malignant neoplasm of prostate: Secondary | ICD-10-CM | POA: Insufficient documentation

## 2010-08-24 DIAGNOSIS — G8929 Other chronic pain: Secondary | ICD-10-CM | POA: Insufficient documentation

## 2010-08-24 DIAGNOSIS — F411 Generalized anxiety disorder: Secondary | ICD-10-CM | POA: Insufficient documentation

## 2010-08-24 NOTE — Assessment & Plan Note (Signed)
He is currently using tramadol for this because he is unhappy with his medication because he felt it did not work well.

## 2010-08-24 NOTE — Assessment & Plan Note (Signed)
We discussed that his anxiety is not the best manage. Typically we don't use only benzodiazepines on a chronic daily 3 times a day basis to do this. We discussed that he really needs an SSRI instead of using them at rescue medication multiple times a day. I discussed her in SSRI and the potential side effects of these medications. We will start with Prozac. I will give him enough Xanax to take up to twice a day if needed. I explained to him that this really is a rescue medication. Patient says he agrees to work with me on this and is willing to try the Prozac. Followup in one month. I'm glad he's recently made from a changing including getting alcohol out of his life.

## 2010-09-13 ENCOUNTER — Other Ambulatory Visit: Payer: Self-pay | Admitting: Family Medicine

## 2010-09-13 MED ORDER — LISINOPRIL 20 MG PO TABS: 20.0000 mg | ORAL_TABLET | Freq: Every day | ORAL | Status: DC

## 2010-09-20 ENCOUNTER — Encounter: Payer: Self-pay | Admitting: Family Medicine

## 2010-09-20 ENCOUNTER — Ambulatory Visit (INDEPENDENT_AMBULATORY_CARE_PROVIDER_SITE_OTHER): Payer: Medicare Other | Admitting: Family Medicine

## 2010-09-20 DIAGNOSIS — G4700 Insomnia, unspecified: Secondary | ICD-10-CM

## 2010-09-20 DIAGNOSIS — I10 Essential (primary) hypertension: Secondary | ICD-10-CM

## 2010-09-20 DIAGNOSIS — F411 Generalized anxiety disorder: Secondary | ICD-10-CM

## 2010-09-20 MED ORDER — LISINOPRIL 20 MG PO TABS: 30.0000 mg | ORAL_TABLET | Freq: Every day | ORAL | Status: DC

## 2010-09-20 NOTE — Progress Notes (Signed)
  Subjective:    Patient ID: Willie Ward, male    DOB: [birth year], 70 y.o.   MRN: [id number]  Hypertension This is a chronic problem. The current episode started more than 1 year ago. The problem is uncontrolled. Pertinent negatives include no chest pain or shortness of breath. There are no associated agents to hypertension. Past treatments include ACE inhibitors. The current treatment provides mild improvement. There are no compliance problems.   He brought in all his meds. Today   Quit smoking 18 days ago.  He really wants to quit.  Says has made a promise to God. Starting walking about 4 days ago. Walking 1/2 mild per day. Still occ drinking alcohol thought not every day.   Anxiety- Says he is doing really well on the Prozac. No SE.  He feels it is really helping his anxiety. Using his xanax bid.   Review of Systems  Respiratory: Negative for shortness of breath.   Cardiovascular: Negative for chest pain.       Objective:   Physical Exam  Constitutional: He is oriented to person, place, and time. He appears well-developed and well-nourished.  HENT:  Head: Normocephalic and atraumatic.  Cardiovascular: Normal rate, regular rhythm and normal heart sounds.   Pulmonary/Chest: Effort normal and breath sounds normal.  Neurological: He is alert and oriented to person, place, and time.  Skin: Skin is warm and dry.  Psychiatric: He has a normal mood and affect. His behavior is normal.          Assessment & Plan:  tob Abuse- That is fantastic he has quit!! Encouraged him to keep it up.

## 2010-09-20 NOTE — Patient Instructions (Signed)
Spirometry testing in one month.

## 2010-09-20 NOTE — Assessment & Plan Note (Signed)
Will increase lisinopril to 1 1/2 tabs and recheck in 4-6 weeks.

## 2010-09-20 NOTE — Assessment & Plan Note (Signed)
GAD-7 score of 3. He is improved and feeling better. Tolerating the SSRI well. Will conitnue current dose.

## 2010-09-20 NOTE — Assessment & Plan Note (Signed)
Doing well on the xanax. Doesn't feel fatigued or sluggish in the AM. Did have one night where took 1.5 tabs.

## 2010-09-25 ENCOUNTER — Other Ambulatory Visit: Payer: Self-pay | Admitting: Family Medicine

## 2010-09-27 ENCOUNTER — Other Ambulatory Visit: Payer: Self-pay | Admitting: Family Medicine

## 2010-09-27 ENCOUNTER — Other Ambulatory Visit: Payer: Self-pay | Admitting: *Deleted

## 2010-09-27 MED ORDER — LEVETIRACETAM ER 500 MG PO TB24: 500.0000 mg | ORAL_TABLET | Freq: Every day | ORAL | Status: DC

## 2010-09-27 MED ORDER — ALPRAZOLAM 1 MG PO TABS: 1.0000 mg | ORAL_TABLET | Freq: Two times a day (BID) | ORAL | Status: DC | PRN

## 2010-09-27 NOTE — Telephone Encounter (Signed)
Pt stopped by the office and wanted to speak with the triage nurse regarding his xanex medication and his lisinopril medication.  Xanex due for refill and was printed out for provider signature.  The lisinopril was increased to (1 1/2) tabs on 09-20-10 so pharmacy called to update that script so pt will not run short of his medication.  Pt currently had 20 mg on hand at home and was trying to cut a non-scored pill and the pills were crumbling on him.  Pharmacist will fix the problem. Jarvis Newcomer, LPN [facility]

## 2010-10-17 ENCOUNTER — Telehealth: Payer: Self-pay | Admitting: Family Medicine

## 2010-10-17 MED ORDER — TRAZODONE HCL 50 MG PO TABS: 50.0000 mg | ORAL_TABLET | Freq: Every day | ORAL | Status: DC

## 2010-10-17 MED ORDER — FLUOXETINE HCL 40 MG PO CAPS: 40.0000 mg | ORAL_CAPSULE | Freq: Every day | ORAL | Status: DC

## 2010-10-17 NOTE — Telephone Encounter (Signed)
Pt called back again and spoke with the triage nurse and said he had a problem.  Having trouble sleeping.  Has prostate CA he said.  Pt states the alprazolam is not working anymore.  Feels very anxious and not sleeping.  Please advise. Plan:  Routed to Dr. Linford Arnold for review Jarvis Newcomer, LPN [facility]

## 2010-10-17 NOTE — Telephone Encounter (Signed)
Will increase his fluoxetine to 40mg  which will help with his anxiety.  No caffeine. Go to bed at the same time. Wake up at same time. Make sure cool environment.  Will send over rx for trazodone. Pt to schedule f/u if doesn't already have one.  Also happy to make a referral for counseling if he would like,

## 2010-10-17 NOTE — Telephone Encounter (Signed)
Pt called and left message on triage nurse voice mail and wants a return call. Plan:  Pt' s call returned.  Pt needs to know his upcoming appt that is scheduled.  Confirmed appt for 11-02-10 with the pt. Jarvis Newcomer, LPN [facility]

## 2010-10-18 NOTE — Telephone Encounter (Signed)
Pt notified with details of the below orders from the provider. Jarvis Newcomer, LPN [facility]

## 2010-10-19 ENCOUNTER — Encounter: Payer: Self-pay | Admitting: Family Medicine

## 2010-10-25 ENCOUNTER — Other Ambulatory Visit: Payer: Self-pay | Admitting: Family Medicine

## 2010-10-25 NOTE — Telephone Encounter (Signed)
Pt called for refill of his xanex medication.  Last filled 09-27-10.   Plan:  Pt informed too early for refill. Jarvis Newcomer, LPN [facility]

## 2010-10-30 ENCOUNTER — Encounter: Payer: Self-pay | Admitting: Family Medicine

## 2010-10-30 ENCOUNTER — Telehealth: Payer: Self-pay | Admitting: Family Medicine

## 2010-10-30 ENCOUNTER — Ambulatory Visit (INDEPENDENT_AMBULATORY_CARE_PROVIDER_SITE_OTHER): Payer: Medicare Other | Admitting: Family Medicine

## 2010-10-30 VITALS — BP 147/78 | HR 90 | Wt 149.0 lb

## 2010-10-30 DIAGNOSIS — M542 Cervicalgia: Secondary | ICD-10-CM

## 2010-10-30 DIAGNOSIS — M47812 Spondylosis without myelopathy or radiculopathy, cervical region: Secondary | ICD-10-CM

## 2010-10-30 DIAGNOSIS — Z23 Encounter for immunization: Secondary | ICD-10-CM

## 2010-10-30 DIAGNOSIS — G40909 Epilepsy, unspecified, not intractable, without status epilepticus: Secondary | ICD-10-CM

## 2010-10-30 DIAGNOSIS — F411 Generalized anxiety disorder: Secondary | ICD-10-CM

## 2010-10-30 MED ORDER — LISINOPRIL 40 MG PO TABS: 40.0000 mg | ORAL_TABLET | Freq: Every day | ORAL | Status: DC

## 2010-10-30 MED ORDER — LEVETIRACETAM ER 500 MG PO TB24: 500.0000 mg | ORAL_TABLET | Freq: Every day | ORAL | Status: DC

## 2010-10-30 MED ORDER — FLUOXETINE HCL 40 MG PO CAPS: 40.0000 mg | ORAL_CAPSULE | Freq: Every day | ORAL | Status: DC

## 2010-10-30 MED ORDER — ALPRAZOLAM 1 MG PO TABS: 1.0000 mg | ORAL_TABLET | Freq: Three times a day (TID) | ORAL | Status: DC | PRN

## 2010-10-30 NOTE — Progress Notes (Signed)
Subjective:    Patient ID: Willie Ward, male    DOB: 1940-03-31, 70 y.o.   MRN: [id number]  HPI He is here for hospital followup today. He exercises wristband from hospital on his wrist. He reports that he Had a seizure and went to hospital in [place].  Says had another seizure and went to [facility]. he says he was off his xanax for about 2 week bc he says it was stollen from his kitchen table. He has also been using it 3 times a day instead of twice a day as previously prescribed because he feels that his nerves are worse and feels him as panicky at times almost like panic attacks. He says he is still taking his fluoxetine and he did bring the bottle in with him today. He is due for refill. He would like me to up his prescription for Xanax to 3 times a day instead of twice a day.  He wants to go back on his pain medications that he was getting from his previous physician. Having neck and low back pain.  He wants an appt with the neurosurgeon.  Prefers [facility].  About 2 years ago had an injury to his neck. He really was unable to fill in further detail about this. On review of his information from the [facility] emergency room visit he did actually have an MRI done of the head that was normal except for some chronic small vessel ischemic changes, with no significant changes from prior. He also had an MRI of the cervical spine without contrast that showed a remote type II odontoid fracture with residual mild posttraumatic deformity. There was also mild marrow signal increase which is likely a reactive change in the left C1-C2 articulation. There is also adjacent cystic change. He did have some degenerative retrolisthesis of C5 on C4 but no acute subluxation. He also had diffuse degenerative changes. He had no central canal stenosis.  Apparently he was also put on Levaquin for possible pneumonia. He had a portable chest x-ray showed a patchy opacity on the left. They did  recommend followup to resolution. He denies any shortness of breath or cough .     Review of Systems     Objective:   Physical Exam  Constitutional: He is oriented to person, place, and time. He appears well-developed.  HENT:  Head: Normocephalic and atraumatic.  Neck: Neck supple. No thyromegaly present.  Cardiovascular: Normal rate, regular rhythm and normal heart sounds.   Pulmonary/Chest: Effort normal and breath sounds normal.  Lymphadenopathy:    He has no cervical adenopathy.  Neurological: He is alert and oriented to person, place, and time.  Skin: Skin is warm and dry.  Psychiatric: He has a normal mood and affect. His behavior is normal.          Assessment & Plan:  Anxiety- will inc xanax to tid. I let him know that he decided to take more than was actually prescribed on the bottle that he needs to come in for an office visit and it will not be represcribed early. He also needs to make sure he is keeping his medications in a safe place. It is possible that his recent seizure was triggered by the Xanax withdrawal.  Seizure disorder-unfortunately I do not have the specifics of his seizure disorder but evidently he was put on Keppra a couple of months ago after he quit drinking alcohol. He is continued to take the medication since then. I  would like to get him in with a neurologist for treatment. Typically I don't follow patients with her seizure disorder but I did refill his Keppra his last visit. He's also due for another refill. Will refer to Dr. Aggie Cosier.   Back Pain - he certainly does have some cervical abnormalities and I can understand he could have some pain. I think the best step at this point would be to consider getting him up and neurosurgeon or possibly an orthopedist, if they are unwilling to see him based on his MRI scan findings. I will hold off on narcotic pain medications at this point in time.  Pneumonia-his chest exam is normal today. He is currently  asymptomatic. I would like to repeat his chest x-ray in one month to make sure it has cleared.  He says he is still not smoking. I congratulated him

## 2010-10-30 NOTE — Telephone Encounter (Signed)
Pt called twice this PM and was set up a referral to Dr. Gaetano Net and he does not want to go back to him.  Pt asked to be called back, Plan:  Routed to [facility] in referrals Jarvis Newcomer, LPN [facility]

## 2010-10-30 NOTE — Patient Instructions (Addendum)
Increase lisinopril to 40mg  daily We will call you with your referrals.

## 2010-10-31 ENCOUNTER — Other Ambulatory Visit: Payer: Self-pay | Admitting: Family Medicine

## 2010-10-31 ENCOUNTER — Telehealth: Payer: Self-pay | Admitting: Family Medicine

## 2010-10-31 NOTE — Telephone Encounter (Signed)
Pt called about his referral to Dr. Gaetano Net.  Pt has paperwork but refuses to go to this provider. Plan:  Pt told he was being transferred to [facility] in referrals to help him with his referral especially if the provider needed to be changed.  Pt informed already had sent a message to [place] earlier in the week but transferred him to speak with Victorino Dike. Jarvis Newcomer, LPN [facility]

## 2010-10-31 NOTE — Telephone Encounter (Signed)
Once again sent this call to [facility] in referrals today when the pt called back about the referral. Jarvis Newcomer, LPN [facility]

## 2010-10-31 NOTE — Telephone Encounter (Signed)
Pt called and said he thought he lost his xanex 1mg  prescription, however; that is not the case.  The xqanex 1 mg script was done electronically and faxed to his pharm yesterday afternoon, but they had power failure last night and they are not showing the electronic script so a verbal order was given for # 90/0 refills.  Pt made aware. Jarvis Newcomer, LPN [facility]

## 2010-11-02 ENCOUNTER — Ambulatory Visit: Payer: Medicare Other | Admitting: Family Medicine

## 2010-11-02 DIAGNOSIS — Z0289 Encounter for other administrative examinations: Secondary | ICD-10-CM

## 2010-11-02 NOTE — Telephone Encounter (Signed)
Patient walked in the office and Darl Pikes informed him I sent his referral to Dr. Gaetano Net and the patient does not want to go there, when really I have not worked on his referral yet and it is no problem for me to send him to another Physician besides Dr. Gaetano Net and I informed the patient that I will call him back with a appointment to another doctor besides Dr. Gaetano Net and patient was very happy to hear that. Thanks, [employer]

## 2010-11-06 ENCOUNTER — Telehealth: Payer: Self-pay | Admitting: Family Medicine

## 2010-11-06 NOTE — Telephone Encounter (Signed)
Can double book for tomorrow.

## 2010-11-06 NOTE — Telephone Encounter (Signed)
Pt notified to call office in the am since front staff gone and have them double book tomorrow ith Dr. Linford Arnold per her authorization for cough and hurting in chest no better after antibiotics. Jarvis Newcomer, LPN [facility]

## 2010-11-06 NOTE — Telephone Encounter (Signed)
I reviewed his old records. Willie Ward he has had seizures from benzo withdrawal in the past and it was recommended that he stop he benzodiazepines.  Thus when due for his next refill on his xanax will dec back to twice a day.  Then next month will wean down to one a day.

## 2010-11-06 NOTE — Telephone Encounter (Signed)
Pt calling saying he was seen at [facility] in [place], [place] I guess approx 10 days or so ago, and was treated for pneumonia he said.  Today he finished his antibiotic treatment and feels no better.  Last office note on 10-30-10 says pt asymptomatic at that office visit and to repeat CXR in 1 mth.  Pt is saying he does not feel well.  Hurts in chest, coughing a lot that non-productive, no fever, and pt is not smoking.  Please advise if we need to see, schedule CXR earlier or other recommendations. Plan:  Routed to Dr. Marlyne Beards, LPN [facility]

## 2010-11-07 ENCOUNTER — Ambulatory Visit (INDEPENDENT_AMBULATORY_CARE_PROVIDER_SITE_OTHER): Payer: Medicare Other | Admitting: Family Medicine

## 2010-11-07 ENCOUNTER — Encounter: Payer: Self-pay | Admitting: Family Medicine

## 2010-11-07 ENCOUNTER — Ambulatory Visit
Admission: RE | Admit: 2010-11-07 | Discharge: 2010-11-07 | Disposition: A | Discharge status: Home / Self Care | Payer: Medicare Other | Source: Ambulatory Visit | Attending: Family Medicine | Admitting: Family Medicine

## 2010-11-07 DIAGNOSIS — M549 Dorsalgia, unspecified: Secondary | ICD-10-CM

## 2010-11-07 DIAGNOSIS — M542 Cervicalgia: Secondary | ICD-10-CM

## 2010-11-07 DIAGNOSIS — H269 Unspecified cataract: Secondary | ICD-10-CM | POA: Insufficient documentation

## 2010-11-07 DIAGNOSIS — R079 Chest pain, unspecified: Secondary | ICD-10-CM

## 2010-11-07 LAB — CBC
MCHC: 33 g/dL (ref 30.0–36.0)
MCV: 104.2 fL — ABNORMAL HIGH (ref 78.0–100.0)
Platelets: 246 10*3/uL (ref 150–400)
WBC: 4.6 10*3/uL (ref 4.0–10.5)

## 2010-11-07 LAB — DIFFERENTIAL
Eosinophils Relative: 2 % (ref 0–5)
Lymphocytes Relative: 20 % (ref 12–46)
Lymphs Abs: 0.9 10*3/uL (ref 0.7–4.0)
Neutro Abs: 3.1 10*3/uL (ref 1.7–7.7)
Neutrophils Relative %: 66 % (ref 43–77)

## 2010-11-07 LAB — OCCULT BLOOD X 1 CARD TO LAB, STOOL: Fecal Occult Bld: POSITIVE

## 2010-11-07 LAB — PROTIME-INR
INR: >9.8 (ref 0.00–1.49)
Prothrombin Time: >90 seconds — ABNORMAL HIGH (ref 11.6–15.2)

## 2010-11-07 LAB — POCT I-STAT, CHEM 8
Chloride: 92 mEq/L — ABNORMAL LOW (ref 96–112)
HCT: 25 % — ABNORMAL LOW (ref 39.0–52.0)
Hemoglobin: 8.5 g/dL — ABNORMAL LOW (ref 13.0–17.0)
Potassium: 3.3 mEq/L — ABNORMAL LOW (ref 3.5–5.1)
Sodium: 136 mEq/L (ref 135–145)

## 2010-11-07 MED ORDER — ALBUTEROL 90 MCG/ACT IN AERS: 2.0000 | INHALATION_SPRAY | Freq: Four times a day (QID) | RESPIRATORY_TRACT | Status: DC | PRN

## 2010-11-07 MED ORDER — FLUOXETINE HCL 40 MG PO CAPS: 40.0000 mg | ORAL_CAPSULE | Freq: Every day | ORAL | Status: DC

## 2010-11-07 MED ORDER — ENSURE PO LIQD: 237.0000 mL | Freq: Three times a day (TID) | ORAL | Status: DC

## 2010-11-07 NOTE — Patient Instructions (Signed)
We will call you with the results of your chest x ray.

## 2010-11-07 NOTE — Progress Notes (Signed)
  Subjective:    Patient ID: Willie Ward, male    DOB: [birth year], 70 y.o.   MRN: [id number]  HPI Has knots on his back.  They are tender.  Have been there for 4-5 months.  No hx of skin cancer.    Chest hurting on thr righ tside and her back. Some SOB.  Still coughing.  Completed his levaquin.  Pain is constant. No GI sxs. Radiating into his right back.   For his neck and back pain.  Says has tried chiropracter in the past but wasn't helpful.   Review of Systems     Objective:   Physical Exam  Constitutional: He is oriented to person, place, and time. He appears well-developed and well-nourished.  HENT:  Head: Normocephalic and atraumatic.  Right Ear: External ear normal.  Left Ear: External ear normal.  Nose: Nose normal.  Mouth/Throat: Oropharynx is clear and moist.          Eyes: Conjunctivae and EOM are normal. Pupils are equal, round, and reactive to light.  Neck: Neck supple. No thyromegaly present.  Cardiovascular: Normal rate and normal heart sounds.   Pulmonary/Chest: Effort normal and breath sounds normal.  Lymphadenopathy:    He has no cervical adenopathy.  Neurological: He is alert and oriented to person, place, and time.  Skin: Skin is warm and dry.  Psychiatric: He has a normal mood and affect.          Assessment & Plan:

## 2010-11-08 ENCOUNTER — Telehealth: Payer: Self-pay | Admitting: *Deleted

## 2010-11-08 NOTE — Telephone Encounter (Signed)
Message copied by Lanae Crumbly on Wed Nov 08, 2010 10:12 AM ------      Message from: Nani Gasser D      Created: Tue Nov 07, 2010  5:32 PM       CXR shows COPD no sign of infection so the pneumonia has cleared.  Will send an inhaler to his pharmacy.

## 2010-11-08 NOTE — Telephone Encounter (Signed)
Pt notified of results and MD instructions. KJ LPN 

## 2010-11-08 NOTE — Telephone Encounter (Signed)
Pt.notified

## 2010-11-10 LAB — PROTIME-INR
INR: 1.4 (ref 0.00–1.49)
INR: 1.4 (ref 0.00–1.49)
INR: 1.4 (ref 0.00–1.49)
INR: 3.5 — ABNORMAL HIGH (ref 0.00–1.49)
INR: >9.8 (ref 0.00–1.49)
Prothrombin Time: 17.5 seconds — ABNORMAL HIGH (ref 11.6–15.2)
Prothrombin Time: 18.2 seconds — ABNORMAL HIGH (ref 11.6–15.2)
Prothrombin Time: 37.8 seconds — ABNORMAL HIGH (ref 11.6–15.2)
Prothrombin Time: >90 seconds — ABNORMAL HIGH (ref 11.6–15.2)

## 2010-11-10 LAB — CBC
HCT: 23.7 % — ABNORMAL LOW (ref 39.0–52.0)
HCT: 26.5 % — ABNORMAL LOW (ref 39.0–52.0)
HCT: 26.9 % — ABNORMAL LOW (ref 39.0–52.0)
HCT: 27.3 % — ABNORMAL LOW (ref 39.0–52.0)
HCT: 27.8 % — ABNORMAL LOW (ref 39.0–52.0)
Hemoglobin: 8.1 g/dL — ABNORMAL LOW (ref 13.0–17.0)
Hemoglobin: 9.2 g/dL — ABNORMAL LOW (ref 13.0–17.0)
Hemoglobin: 9.6 g/dL — ABNORMAL LOW (ref 13.0–17.0)
MCHC: 32.6 g/dL (ref 30.0–36.0)
MCHC: 33.9 g/dL (ref 30.0–36.0)
MCHC: 34.3 g/dL (ref 30.0–36.0)
MCHC: 34.5 g/dL (ref 30.0–36.0)
MCV: 100.3 fL — ABNORMAL HIGH (ref 78.0–100.0)
MCV: 100.8 fL — ABNORMAL HIGH (ref 78.0–100.0)
MCV: 96.5 fL (ref 78.0–100.0)
MCV: 97.7 fL (ref 78.0–100.0)
MCV: 98.3 fL (ref 78.0–100.0)
MCV: 98.5 fL (ref 78.0–100.0)
MCV: 98.7 fL (ref 78.0–100.0)
Platelets: 111 10*3/uL — ABNORMAL LOW (ref 150–400)
Platelets: 112 10*3/uL — ABNORMAL LOW (ref 150–400)
Platelets: 119 10*3/uL — ABNORMAL LOW (ref 150–400)
Platelets: 120 10*3/uL — ABNORMAL LOW (ref 150–400)
Platelets: 120 10*3/uL — ABNORMAL LOW (ref 150–400)
Platelets: 135 10*3/uL — ABNORMAL LOW (ref 150–400)
Platelets: 163 10*3/uL (ref 150–400)
RBC: 2.64 MIL/uL — ABNORMAL LOW (ref 4.22–5.81)
RBC: 2.68 MIL/uL — ABNORMAL LOW (ref 4.22–5.81)
RBC: 2.74 MIL/uL — ABNORMAL LOW (ref 4.22–5.81)
RBC: 2.88 MIL/uL — ABNORMAL LOW (ref 4.22–5.81)
RDW: 18.2 % — ABNORMAL HIGH (ref 11.5–15.5)
RDW: 19.4 % — ABNORMAL HIGH (ref 11.5–15.5)
WBC: 3.9 10*3/uL — ABNORMAL LOW (ref 4.0–10.5)
WBC: 4.1 10*3/uL (ref 4.0–10.5)
WBC: 4.3 10*3/uL (ref 4.0–10.5)

## 2010-11-10 LAB — COMPREHENSIVE METABOLIC PANEL
ALT: 9 U/L (ref 0–53)
AST: 15 U/L (ref 0–37)
AST: 16 U/L (ref 0–37)
AST: 24 U/L (ref 0–37)
Albumin: 2.2 g/dL — ABNORMAL LOW (ref 3.5–5.2)
Albumin: 2.2 g/dL — ABNORMAL LOW (ref 3.5–5.2)
Albumin: 2.2 g/dL — ABNORMAL LOW (ref 3.5–5.2)
Albumin: 2.3 g/dL — ABNORMAL LOW (ref 3.5–5.2)
Albumin: 2.3 g/dL — ABNORMAL LOW (ref 3.5–5.2)
Alkaline Phosphatase: 37 U/L — ABNORMAL LOW (ref 39–117)
Alkaline Phosphatase: 42 U/L (ref 39–117)
Alkaline Phosphatase: 46 U/L (ref 39–117)
BUN: 13 mg/dL (ref 6–23)
BUN: 68 mg/dL — ABNORMAL HIGH (ref 6–23)
BUN: 7 mg/dL (ref 6–23)
BUN: 78 mg/dL — ABNORMAL HIGH (ref 6–23)
CO2: 28 mEq/L (ref 19–32)
CO2: 29 mEq/L (ref 19–32)
CO2: 29 mEq/L (ref 19–32)
Calcium: 7.5 mg/dL — ABNORMAL LOW (ref 8.4–10.5)
Calcium: 8.2 mg/dL — ABNORMAL LOW (ref 8.4–10.5)
Calcium: 8.5 mg/dL (ref 8.4–10.5)
Chloride: 108 mEq/L (ref 96–112)
Chloride: 112 mEq/L (ref 96–112)
Chloride: 112 mEq/L (ref 96–112)
Chloride: 115 mEq/L — ABNORMAL HIGH (ref 96–112)
Chloride: 115 mEq/L — ABNORMAL HIGH (ref 96–112)
Creatinine, Ser: 0.75 mg/dL (ref 0.4–1.5)
Creatinine, Ser: 0.81 mg/dL (ref 0.4–1.5)
Creatinine, Ser: 1.35 mg/dL (ref 0.4–1.5)
Creatinine, Ser: 2.63 mg/dL — ABNORMAL HIGH (ref 0.4–1.5)
GFR calc Af Amer: >60 mL/min (ref >60)
GFR calc Af Amer: >60 mL/min (ref >60)
GFR calc Af Amer: >60 mL/min (ref >60)
GFR calc non Af Amer: >60 mL/min (ref >60)
GFR calc non Af Amer: >60 mL/min (ref >60)
GFR calc non Af Amer: >60 mL/min (ref >60)
GFR calc non Af Amer: >60 mL/min (ref >60)
Glucose, Bld: 107 mg/dL — ABNORMAL HIGH (ref 70–99)
Glucose, Bld: 90 mg/dL (ref 70–99)
Glucose, Bld: 92 mg/dL (ref 70–99)
Potassium: 2.9 mEq/L — ABNORMAL LOW (ref 3.5–5.1)
Potassium: 3.2 mEq/L — ABNORMAL LOW (ref 3.5–5.1)
Potassium: 3.4 mEq/L — ABNORMAL LOW (ref 3.5–5.1)
Sodium: 146 mEq/L — ABNORMAL HIGH (ref 135–145)
Total Bilirubin: 0.7 mg/dL (ref 0.3–1.2)
Total Bilirubin: 0.7 mg/dL (ref 0.3–1.2)
Total Bilirubin: 0.9 mg/dL (ref 0.3–1.2)
Total Bilirubin: 0.9 mg/dL (ref 0.3–1.2)
Total Bilirubin: 0.9 mg/dL (ref 0.3–1.2)
Total Protein: 4.8 g/dL — ABNORMAL LOW (ref 6.0–8.3)
Total Protein: 4.9 g/dL — ABNORMAL LOW (ref 6.0–8.3)

## 2010-11-10 LAB — PREPARE FRESH FROZEN PLASMA

## 2010-11-10 LAB — CROSSMATCH
ABO/RH(D): A POS
Antibody Screen: NEGATIVE

## 2010-11-10 LAB — CARDIAC PANEL(CRET KIN+CKTOT+MB+TROPI)
CK, MB: 1.9 ng/mL (ref 0.3–4.0)
Troponin I: 0.01 ng/mL (ref 0.00–0.06)

## 2010-11-10 LAB — DIFFERENTIAL
Eosinophils Absolute: 0 10*3/uL (ref 0.0–0.7)
Lymphs Abs: 0.6 10*3/uL — ABNORMAL LOW (ref 0.7–4.0)
Neutro Abs: 3.2 10*3/uL (ref 1.7–7.7)
Neutrophils Relative %: 74 % (ref 43–77)

## 2010-11-10 LAB — URINALYSIS, ROUTINE W REFLEX MICROSCOPIC
Bilirubin Urine: NEGATIVE
Hgb urine dipstick: NEGATIVE
Specific Gravity, Urine: 1.013 (ref 1.005–1.030)
pH: 6 (ref 5.0–8.0)

## 2010-11-10 LAB — URINE CULTURE
Culture: NO GROWTH
Special Requests: NEGATIVE

## 2010-11-10 LAB — CULTURE, BLOOD (ROUTINE X 2): Culture: NO GROWTH

## 2010-11-10 LAB — BASIC METABOLIC PANEL
CO2: 27 mEq/L (ref 19–32)
Chloride: 105 mEq/L (ref 96–112)
GFR calc Af Amer: 49 mL/min — ABNORMAL LOW (ref >60)
Glucose, Bld: 100 mg/dL — ABNORMAL HIGH (ref 70–99)
Sodium: 142 mEq/L (ref 135–145)

## 2010-11-10 LAB — BLOOD GAS, ARTERIAL
Acid-Base Excess: 7.3 mmol/L — ABNORMAL HIGH (ref 0.0–2.0)
Drawn by: 145321
TCO2: 31.3 mmol/L (ref 0–100)
pCO2 arterial: 54.9 mmHg — ABNORMAL HIGH (ref 35.0–45.0)
pH, Arterial: 7.392 (ref 7.350–7.450)
pO2, Arterial: 99.4 mmHg (ref 80.0–100.0)

## 2010-11-10 LAB — AMMONIA: Ammonia: 28 umol/L (ref 11–35)

## 2010-11-10 LAB — CK TOTAL AND CKMB (NOT AT ARMC)
CK, MB: 1.5 ng/mL (ref 0.3–4.0)
Total CK: 33 U/L (ref 7–232)

## 2010-11-10 LAB — PREPARE RBC (CROSSMATCH)

## 2010-11-10 LAB — ABO/RH: ABO/RH(D): A POS

## 2010-11-20 LAB — COMPREHENSIVE METABOLIC PANEL
ALT: 17
AST: 27
CO2: 33 — ABNORMAL HIGH
Chloride: 96
Creatinine, Ser: 0.9
GFR calc Af Amer: >60
GFR calc non Af Amer: >60
Sodium: 133 — ABNORMAL LOW
Total Bilirubin: 1.2

## 2010-11-20 LAB — CBC
Hemoglobin: 16
MCV: 106.5 — ABNORMAL HIGH
RBC: 4.34
WBC: 6.1

## 2010-11-22 NOTE — Telephone Encounter (Signed)
Acknowledged. Kierah Goatley, LPN /Triage  

## 2010-11-28 ENCOUNTER — Other Ambulatory Visit: Payer: Self-pay | Admitting: Family Medicine

## 2010-11-29 ENCOUNTER — Other Ambulatory Visit: Payer: Self-pay | Admitting: Family Medicine

## 2010-11-29 DIAGNOSIS — F411 Generalized anxiety disorder: Secondary | ICD-10-CM

## 2010-11-29 DIAGNOSIS — C61 Malignant neoplasm of prostate: Secondary | ICD-10-CM | POA: Insufficient documentation

## 2010-11-29 MED ORDER — ALPRAZOLAM 1 MG PO TABS: 1.0000 mg | ORAL_TABLET | Freq: Three times a day (TID) | ORAL | Status: DC | PRN

## 2010-11-29 NOTE — Telephone Encounter (Signed)
Pt called for refill of his xanex. Plan:  Refilled # 90/no refills. Willie Newcomer, LPN [facility]

## 2010-12-01 ENCOUNTER — Encounter: Payer: Self-pay | Admitting: Family Medicine

## 2010-12-01 ENCOUNTER — Ambulatory Visit (INDEPENDENT_AMBULATORY_CARE_PROVIDER_SITE_OTHER): Payer: Medicare Other | Admitting: Family Medicine

## 2010-12-01 ENCOUNTER — Other Ambulatory Visit: Payer: Self-pay | Admitting: Family Medicine

## 2010-12-01 VITALS — BP 170/100 | HR 76 | Wt 149.0 lb

## 2010-12-01 DIAGNOSIS — I10 Essential (primary) hypertension: Secondary | ICD-10-CM

## 2010-12-01 DIAGNOSIS — Z23 Encounter for immunization: Secondary | ICD-10-CM

## 2010-12-01 DIAGNOSIS — F419 Anxiety disorder, unspecified: Secondary | ICD-10-CM

## 2010-12-01 DIAGNOSIS — F411 Generalized anxiety disorder: Secondary | ICD-10-CM

## 2010-12-01 MED ORDER — AMITRIPTYLINE HCL 25 MG PO TABS: 25.0000 mg | ORAL_TABLET | Freq: Every day | ORAL | Status: DC

## 2010-12-01 MED ORDER — METOPROLOL SUCCINATE ER 25 MG PO TB24: 25.0000 mg | ORAL_TABLET | Freq: Every day | ORAL | Status: DC

## 2010-12-01 NOTE — Progress Notes (Signed)
  Subjective:    Patient ID: Willie Ward, male    DOB: March 28, 1940, 70 y.o.   MRN: [id number]  HPI He is here today to followup on his anxiety. He feels like he is doing okay. He did become his prescription yesterday.  Hypertension-he says he has been compliant with his medications. He denies any chest pain or short of breath. He says he has noticed a couple of mild headaches over the last couple weeks which are new. He has totaled or rundown and fatigued.  Chronic low back pain-he has appointments coming up to see the neurosurgeon as well as the pain management clinic. He canceled both appointments because he says he cannot afford the specialty co-pays.   Review of Systems     Objective:   Physical Exam  Constitutional: He is oriented to person, place, and time. He appears well-developed and well-nourished.  HENT:  Head: Normocephalic and atraumatic.  Neck: Neck supple. No thyromegaly present.  Cardiovascular: Normal rate, regular rhythm and normal heart sounds.   Pulmonary/Chest: Effort normal and breath sounds normal.  Lymphadenopathy:    He has no cervical adenopathy.  Neurological: He is alert and oriented to person, place, and time.  Skin: Skin is warm and dry.  Psychiatric: He has a normal mood and affect. His behavior is normal.          Assessment & Plan:  Anxiety - Will work on decreasing his xanax.  He has been 2 neurologists and they both feel his seizure are from running out of his xanax because he takes more than prescribed and then runs out. We will wean him off these meds. I also reviewed his notes from prior PCP and they were weaning him off for the same reasons.  On next refill will decrease to twice a day (#60 tabs).  Then will continue to taper. He will not be happy about the taper but really I think this is best for his safety. We have discussed this at her last office visit.  Hypertension-not well controlled. He says he did take his medication today so I  will add Toprol to his regimen. I encouraged him to eat a more healthy diet. Avoiding excess salt. I suspect this may be a big trigger for him. Recheck blood pressure in 3 or 4 weeks.  Chronic Back Pain - He has canceled his neurosurg and Pain clinic appts.  He says he can't afford the copays.   Pneumonia vaccine updated today.

## 2010-12-01 NOTE — Telephone Encounter (Signed)
Pt called and said he was suppose to get a BP med (change).  Pt said he also was to get a med to help him sleep. Plan:  Pt notified that metoprolol was added and sent today, and sleep med sent today as well. Jarvis Newcomer, LPN [facility]

## 2010-12-04 ENCOUNTER — Telehealth: Payer: Self-pay | Admitting: Family Medicine

## 2010-12-04 NOTE — Telephone Encounter (Signed)
Pt calling because he was recently seen and was given orders to take to [facility], but he called this morning because he forgot about them and found them in his pocket later and call this am to see if it is okay for him to go ahead and use the orders and have drawn this week. Plan:  Pt informed okay to use the order he had and go this week at his convenience. Jarvis Newcomer, LPN [facility]

## 2010-12-19 ENCOUNTER — Telehealth: Payer: Self-pay | Admitting: *Deleted

## 2010-12-19 NOTE — Telephone Encounter (Signed)
error 

## 2010-12-20 LAB — COMPLETE METABOLIC PANEL WITH GFR
BUN: 10 mg/dL (ref 6–23)
CO2: 29 mEq/L (ref 19–32)
Calcium: 9.7 mg/dL (ref 8.4–10.5)
Creat: 0.93 mg/dL (ref 0.50–1.35)
GFR, Est African American: >89 mL/min/{1.73_m2}
GFR, Est Non African American: 84 mL/min/{1.73_m2}
Glucose, Bld: 91 mg/dL (ref 70–99)
Total Bilirubin: 1.2 mg/dL (ref 0.3–1.2)

## 2010-12-22 ENCOUNTER — Encounter: Payer: Self-pay | Admitting: Family Medicine

## 2010-12-22 ENCOUNTER — Ambulatory Visit (INDEPENDENT_AMBULATORY_CARE_PROVIDER_SITE_OTHER): Payer: Medicare Other | Admitting: Family Medicine

## 2010-12-22 VITALS — BP 143/77 | HR 85 | Wt 151.0 lb

## 2010-12-22 DIAGNOSIS — I10 Essential (primary) hypertension: Secondary | ICD-10-CM

## 2010-12-22 MED ORDER — METOPROLOL SUCCINATE ER 50 MG PO TB24: 50.0000 mg | ORAL_TABLET | Freq: Every day | ORAL | Status: DC

## 2010-12-22 MED ORDER — LISINOPRIL 40 MG PO TABS: 40.0000 mg | ORAL_TABLET | Freq: Every day | ORAL | Status: DC

## 2010-12-22 NOTE — Progress Notes (Signed)
  Subjective:    Patient ID: Willie Ward, male    DOB: 07-17-1940, 69 y.o.   MRN: [id number]  Hypertension This is a chronic problem. The current episode started more than 1 year ago. The problem is unchanged. Pertinent negatives include no chest pain, orthopnea, palpitations or shortness of breath. There are no associated agents to hypertension. Past treatments include ACE inhibitors and beta blockers. The current treatment provides moderate improvement. There are no compliance problems.    he is tolerating metoprolol well but we added at his last office visit. He also needs a refill on his lisinopril.  Review of Systems  Respiratory: Negative for shortness of breath.   Cardiovascular: Negative for chest pain, palpitations and orthopnea.       Objective:   Physical Exam  Constitutional: He is oriented to person, place, and time. He appears well-developed and well-nourished.  HENT:  Head: Normocephalic and atraumatic.  Eyes: Conjunctivae are normal. Pupils are equal, round, and reactive to light.  Neck: Neck supple. No thyromegaly present.  Cardiovascular: Normal rate, regular rhythm and normal heart sounds.   Pulmonary/Chest: Effort normal and breath sounds normal.  Musculoskeletal: He exhibits no edema.  Lymphadenopathy:    He has no cervical adenopathy.  Neurological: He is alert and oriented to person, place, and time.  Skin: Skin is warm and dry.  Psychiatric: He has a normal mood and affect. His behavior is normal.          Assessment & Plan:  HTN- Doing well. Will increase the metoprolol to 50mg  and f/u in 6 weeks.

## 2010-12-28 ENCOUNTER — Other Ambulatory Visit: Payer: Self-pay | Admitting: Family Medicine

## 2011-01-01 ENCOUNTER — Other Ambulatory Visit: Payer: Self-pay | Admitting: Family Medicine

## 2011-01-01 ENCOUNTER — Other Ambulatory Visit: Payer: Self-pay | Admitting: *Deleted

## 2011-01-01 DIAGNOSIS — F411 Generalized anxiety disorder: Secondary | ICD-10-CM

## 2011-01-01 MED ORDER — ALPRAZOLAM 1 MG PO TABS: 1.0000 mg | ORAL_TABLET | Freq: Three times a day (TID) | ORAL | Status: DC | PRN

## 2011-01-01 MED ORDER — AMITRIPTYLINE HCL 25 MG PO TABS: 25.0000 mg | ORAL_TABLET | Freq: Every day | ORAL | Status: DC

## 2011-01-01 MED ORDER — ALPRAZOLAM 1 MG PO TABS: 1.0000 mg | ORAL_TABLET | Freq: Two times a day (BID) | ORAL | Status: DC | PRN

## 2011-01-01 NOTE — Progress Notes (Signed)
We are decreasing his dose to bid (#60 tabs for safety). Next month will dec to 50 tabs.

## 2011-01-04 IMAGING — CT CT ANGIO CHEST
2 of 3 series · 19 of 32 positions shown · IV contrast (APPLIED)
Comparison: Chest x-ray of 02/28/2009

CLINICAL DATA: Pleuritic chest pain, cough, short of breath,
history prostate carcinoma in 1779

CT ANGIOGRAPHY CHEST WITH CONTRAST
TECHNIQUE: Multidetector CT imaging of the chest was performed
using the standard protocol during bolus administration of
intravenous contrast.  Multiplanar CT image reconstructions
including MIPs were obtained to evaluate the vascular anatomy.
Contrast:  80 ml [id number]

[Series 5: pe thins @ 1mm · axial · 0.70mm/px · z∈[-339,-48]mm · 17 of 329 slices shown]
[im 19/329  lung]
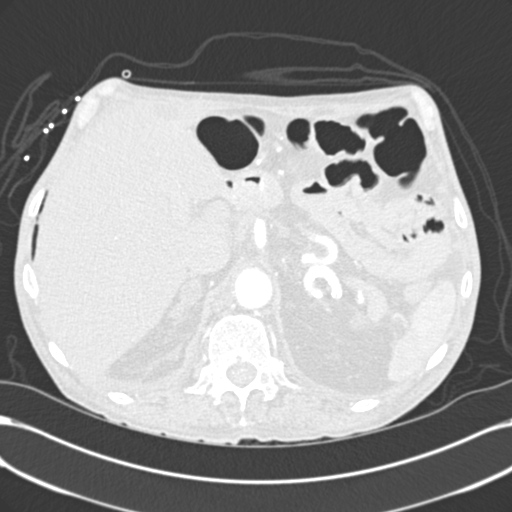
[im 37/329  soft-tissue]
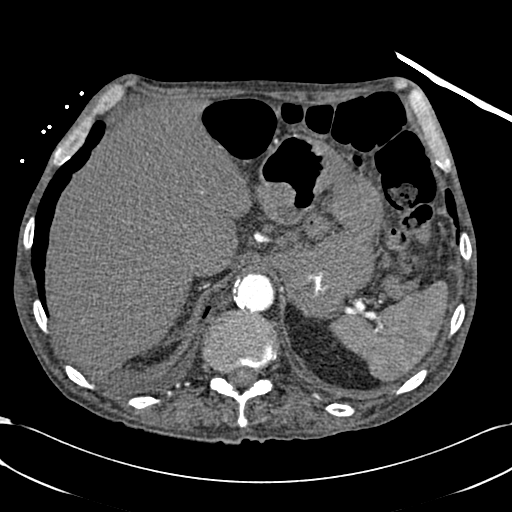
[im 55/329  lung]
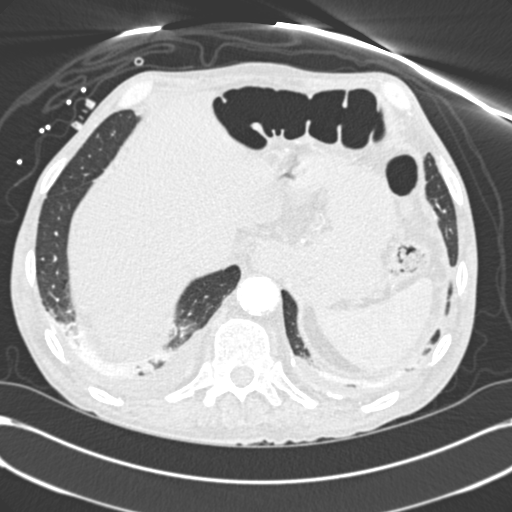
[im 73/329  soft-tissue]
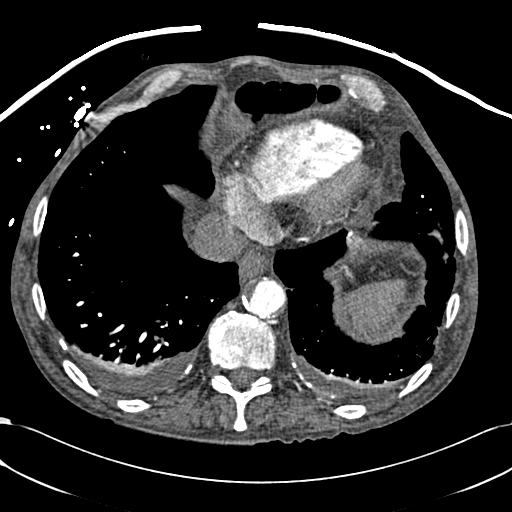
[im 92/329  lung]
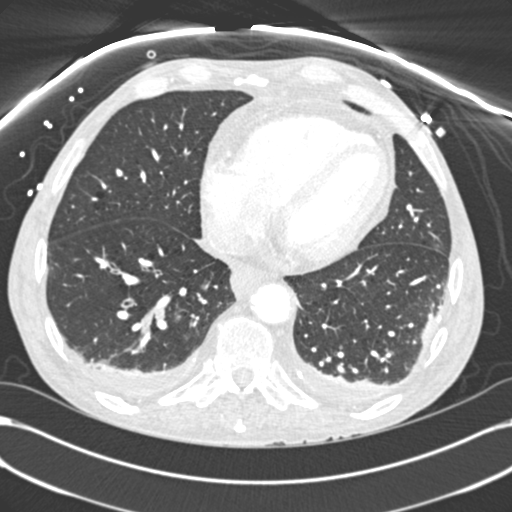
[im 110/329  soft-tissue]
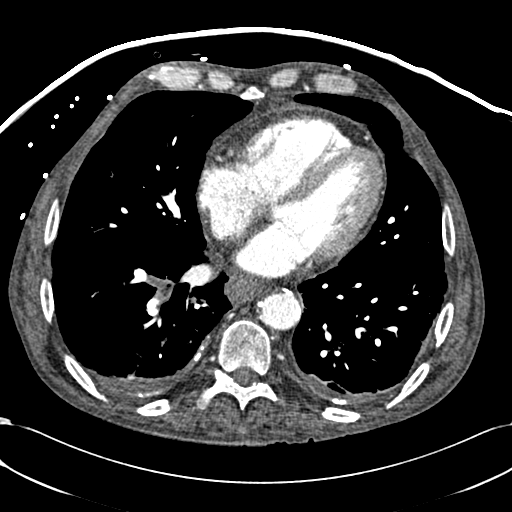
[im 128/329  lung]
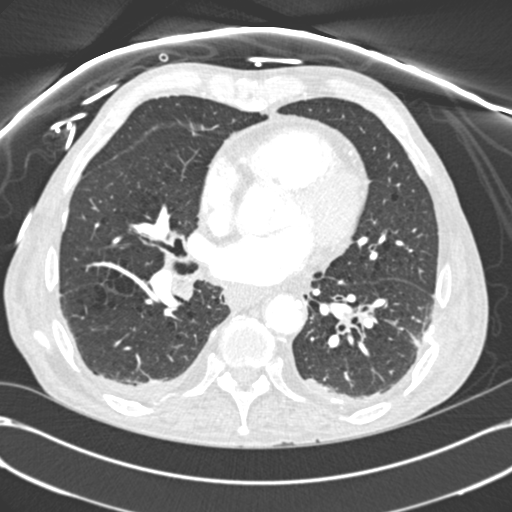
[im 146/329  soft-tissue]
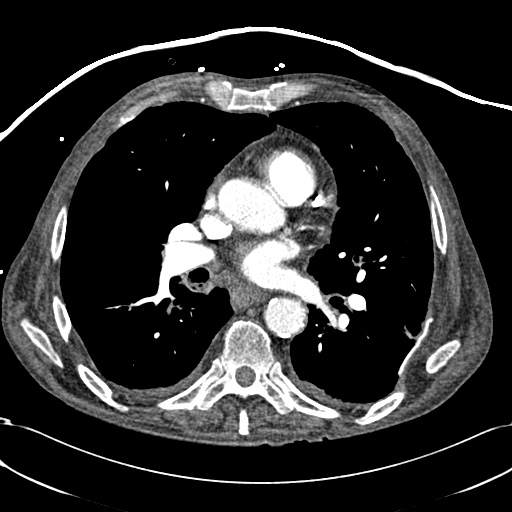
[im 165/329  lung]
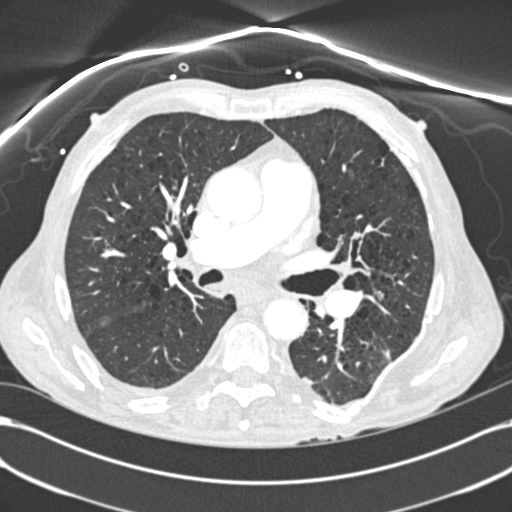
[im 183/329  soft-tissue]
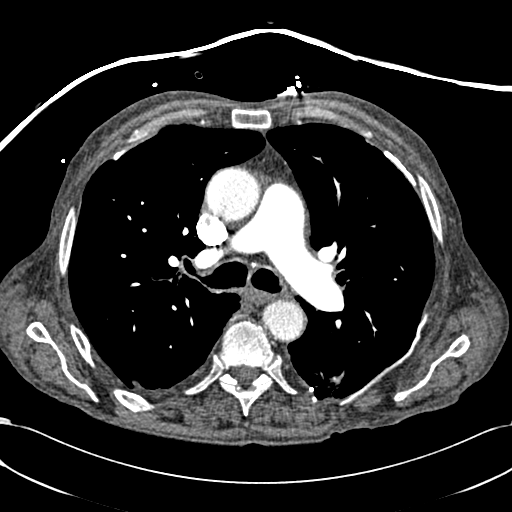
[im 201/329  lung]
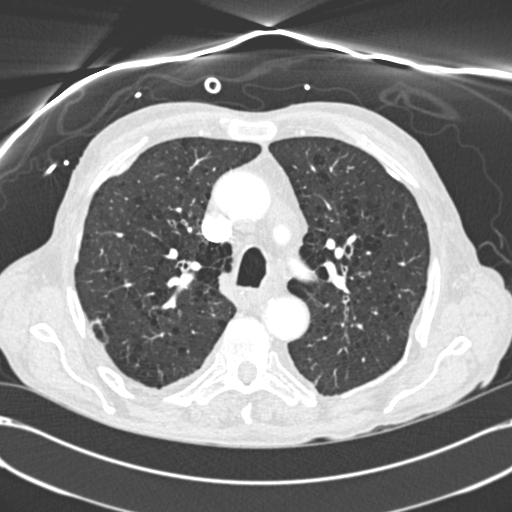
[im 219/329  soft-tissue]
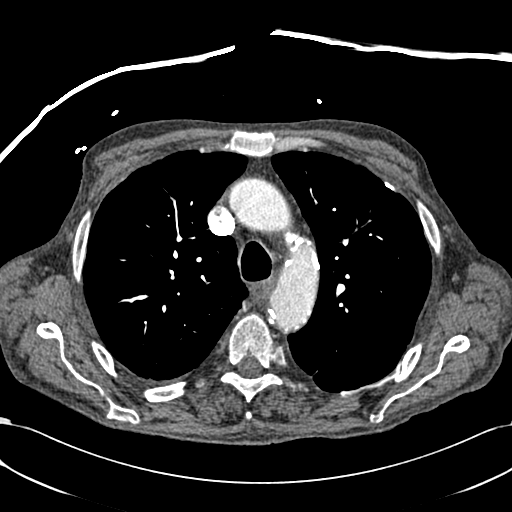
[im 237/329  lung]
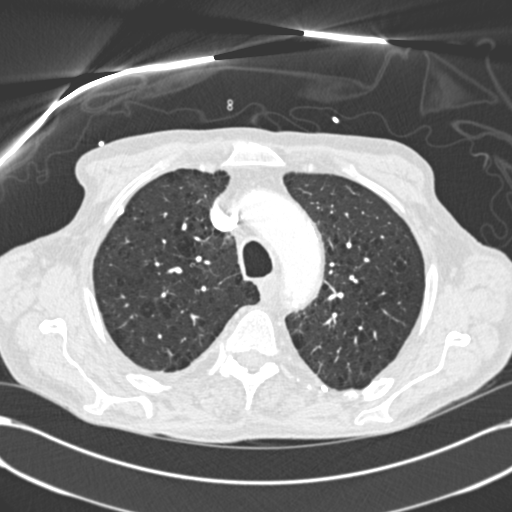
[im 256/329  soft-tissue]
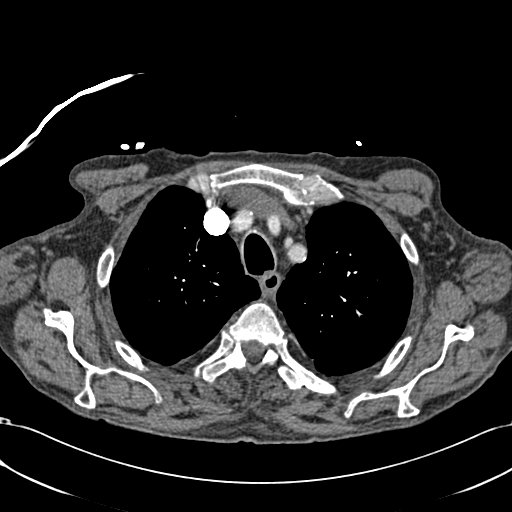
[im 274/329  lung]
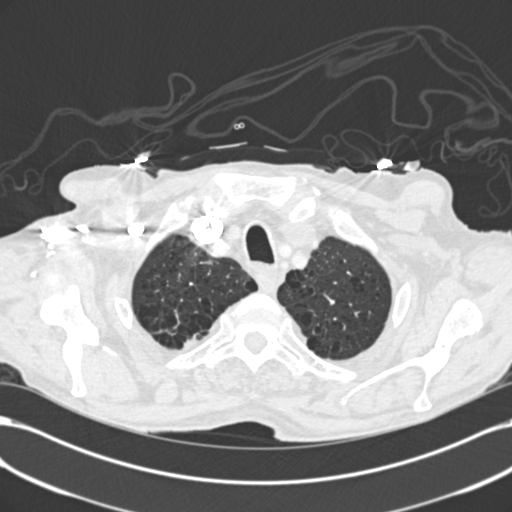
[im 292/329  soft-tissue]
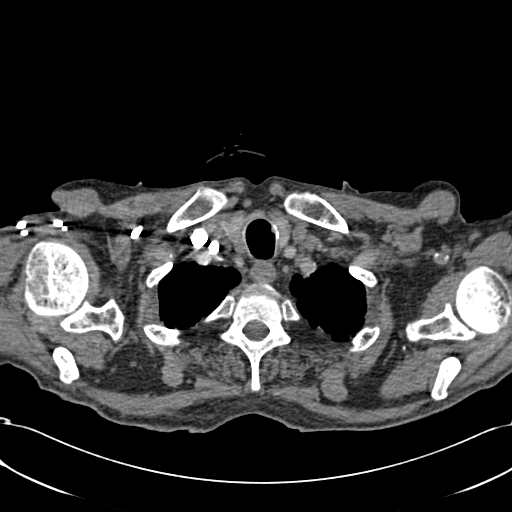
[im 310/329  lung]
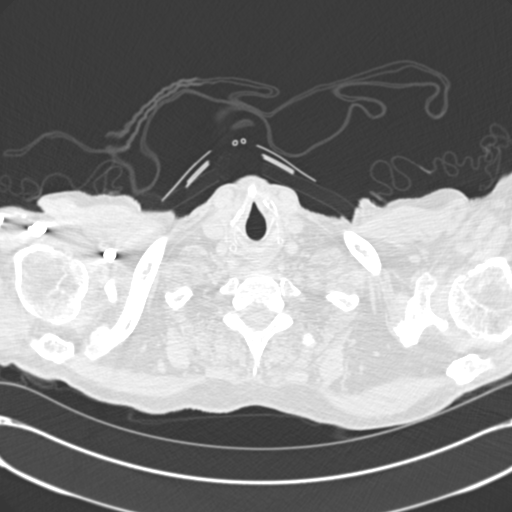

[Series 7: pe lung windows · axial · 0.70mm/px · z∈[-289,-226]mm · 2 of 107 slices shown]
[im 22/107  soft-tissue]
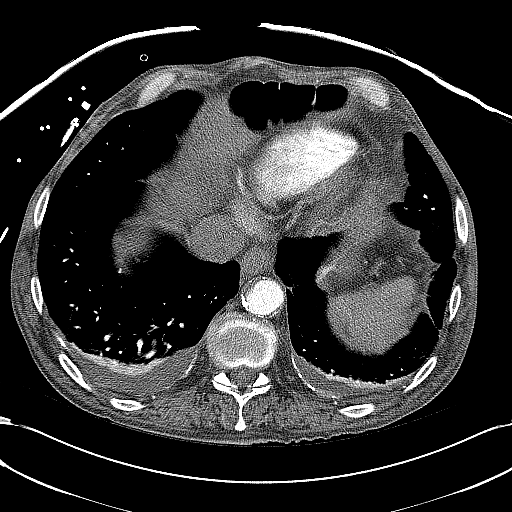
[im 43/107  soft-tissue]
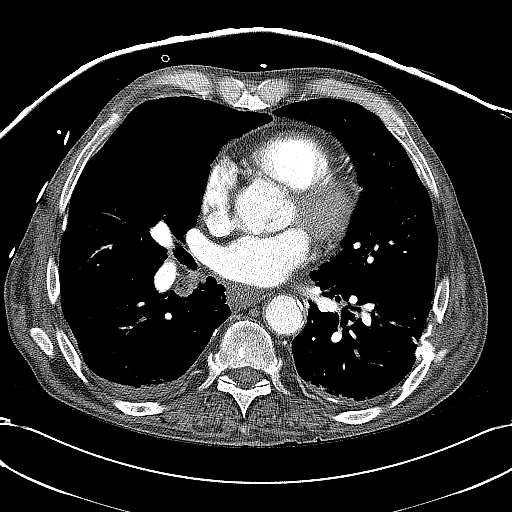

[19 of 32 positions shown; findings below may reference images not displayed]

FINDINGS: The pulmonary arteries opacify and there is no evidence
of acute pulmonary embolism.  The thoracic aorta opacifies with no
acute abnormality noted.  No mediastinal or hilar adenopathy is
seen.

On lung window images diffuse emphysematous changes are noted
throughout the lungs.  No lung infiltrate or lung nodule is seen.
There are bilateral pleural effusions present with compressive
atelectasis in the posterior lower lobes bilaterally.  There is
also mucous layering within the right mainstem bronchus, extending
into the right lower lobe bronchi consistent with mucus plugging.
Calcified granulomas present within the lingula and there are
calcified left hilar nodes consistent with prior granulomatous
disease.  Some calcification of the pleura is noted which may be
due to prior trauma or possibly changes of asbestosis.
Degenerative changes are noted at the left AC joint.

Review of the MIP images confirms the above findings.
IMPRESSION: 1.  No evidence of acute pulmonary embolism.
2.  There is mucous throughout the right mainstem bronchus
extending into the branches to the right lower lobe consistent with
mucous plugging.
3.  Small bilateral pleural effusions and compressive atelectasis
of the posterior lower lobes.
4.  Calcified pleura consist with prior trauma or possibly changes
of asbestosis.
5.  COPD. Changes of prior granulomatous disease.

## 2011-01-22 ENCOUNTER — Other Ambulatory Visit: Payer: Self-pay | Admitting: Family Medicine

## 2011-01-31 ENCOUNTER — Other Ambulatory Visit: Payer: Self-pay | Admitting: Family Medicine

## 2011-02-06 HISTORY — PX: CATARACT EXTRACTION, BILATERAL: SHX1313

## 2011-02-09 ENCOUNTER — Ambulatory Visit (INDEPENDENT_AMBULATORY_CARE_PROVIDER_SITE_OTHER): Payer: Medicare Other | Admitting: Family Medicine

## 2011-02-09 ENCOUNTER — Encounter: Payer: Self-pay | Admitting: Family Medicine

## 2011-02-09 VITALS — BP 129/76 | HR 68 | Wt 150.0 lb

## 2011-02-09 DIAGNOSIS — F17200 Nicotine dependence, unspecified, uncomplicated: Secondary | ICD-10-CM

## 2011-02-09 DIAGNOSIS — I10 Essential (primary) hypertension: Secondary | ICD-10-CM

## 2011-02-09 DIAGNOSIS — E871 Hypo-osmolality and hyponatremia: Secondary | ICD-10-CM

## 2011-02-09 DIAGNOSIS — Z720 Tobacco use: Secondary | ICD-10-CM

## 2011-02-09 MED ORDER — AMITRIPTYLINE HCL 50 MG PO TABS: 50.0000 mg | ORAL_TABLET | Freq: Every day | ORAL | Status: DC

## 2011-02-09 MED ORDER — METOPROLOL SUCCINATE ER 50 MG PO TB24: 50.0000 mg | ORAL_TABLET | Freq: Every day | ORAL | Status: DC

## 2011-02-09 NOTE — Patient Instructions (Signed)
Schedule spirometry for next months.

## 2011-02-09 NOTE — Progress Notes (Signed)
  Subjective:    Patient ID: Willie Ward, male    DOB: [birth year], 71 y.o.   MRN: [id number]  Hypertension This is a chronic problem. The current episode started more than 1 year ago. The problem is controlled. Pertinent negatives include no blurred vision or shortness of breath. There are no associated agents to hypertension. Past treatments include ACE inhibitors. The current treatment provides mild improvement. There are no compliance problems.       Review of Systems  Eyes: Negative for blurred vision.  Respiratory: Negative for shortness of breath.        Objective:   Physical Exam  Constitutional: He is oriented to person, place, and time. He appears well-developed and well-nourished.  HENT:  Head: Normocephalic and atraumatic.  Cardiovascular: Normal rate, regular rhythm and normal heart sounds.   Pulmonary/Chest: Effort normal.       Coarse breath sounds at the bases bilaterally  Musculoskeletal: He exhibits no edema.  Neurological: He is alert and oriented to person, place, and time.  Skin: Skin is warm and dry.  Psychiatric: He has a normal mood and affect. His behavior is normal.          Assessment & Plan:  Hypertension-well controlled today. He is at goal and tolerating his medications well without any side effects. He says he is taking his medications regularly.  Tobacco abuse-I encouraged him to schedule spirometry in the next month. I suspect he has COPD. Other to better evaluate him in stage and treat him appropriately.  Hyponatremia-due to recheck a BMP today. If it persists then consider ischemia medication side effect.

## 2011-02-10 LAB — BASIC METABOLIC PANEL
Chloride: 98 mEq/L (ref 96–112)
Potassium: 4.8 mEq/L (ref 3.5–5.3)
Sodium: 134 mEq/L — ABNORMAL LOW (ref 135–145)

## 2011-02-28 ENCOUNTER — Other Ambulatory Visit: Payer: Self-pay | Admitting: Family Medicine

## 2011-03-12 ENCOUNTER — Encounter: Payer: Self-pay | Admitting: Family Medicine

## 2011-03-12 ENCOUNTER — Ambulatory Visit (INDEPENDENT_AMBULATORY_CARE_PROVIDER_SITE_OTHER): Payer: Medicare Other | Admitting: Family Medicine

## 2011-03-12 VITALS — BP 164/90 | HR 74 | Temp 97.5°F | Wt 155.0 lb

## 2011-03-12 DIAGNOSIS — I10 Essential (primary) hypertension: Secondary | ICD-10-CM

## 2011-03-12 DIAGNOSIS — F17200 Nicotine dependence, unspecified, uncomplicated: Secondary | ICD-10-CM

## 2011-03-12 DIAGNOSIS — Z720 Tobacco use: Secondary | ICD-10-CM

## 2011-03-12 DIAGNOSIS — J449 Chronic obstructive pulmonary disease, unspecified: Secondary | ICD-10-CM | POA: Insufficient documentation

## 2011-03-12 MED ORDER — TIOTROPIUM BROMIDE MONOHYDRATE 18 MCG IN CAPS: 18.0000 ug | ORAL_CAPSULE | Freq: Every day | RESPIRATORY_TRACT | Status: DC

## 2011-03-12 NOTE — Progress Notes (Signed)
  Subjective:    Patient ID: Willie Ward, male    DOB: 21-Jan-1941, 71 y.o.   MRN: [id number]  HPI Here for Spirometry.  Smoker.  Had quit x 4 mo but started again. Uses albuterol a couple of times.   HTN- only took one of his BP meds. This am. Says he forgot to fill his pill box.    Review of Systems     Objective:   Physical Exam  Constitutional: He appears well-developed and well-nourished.          Assessment & Plan:  COPD- Spirometry performed.  Reviewed results.  Based on results her really felt her understood how to better do the test post NEB, so I really used his pos\st neb numbers to calculate his stage of Moderate COPD.Will start Spiriva. Demonstrated how to use device adn pt gave himself first dose in the room. Also discussed smoking cessation. Spirometry on 03/12/11 showed FVC of 97% FEV1 of 67% and a ratio of 55, this is consistent with moderate COPD, stage II.  HTN- Needs to get back on both meds. Was at goal at last visit when was on both meds.

## 2011-03-30 ENCOUNTER — Other Ambulatory Visit: Payer: Self-pay | Admitting: Family Medicine

## 2011-04-03 ENCOUNTER — Encounter: Payer: Self-pay | Admitting: Family Medicine

## 2011-04-11 ENCOUNTER — Ambulatory Visit (INDEPENDENT_AMBULATORY_CARE_PROVIDER_SITE_OTHER): Payer: Medicare Other | Admitting: Family Medicine

## 2011-04-11 ENCOUNTER — Encounter: Payer: Self-pay | Admitting: Family Medicine

## 2011-04-11 ENCOUNTER — Telehealth: Payer: Self-pay | Admitting: Family Medicine

## 2011-04-11 VITALS — BP 121/81 | HR 81 | Ht 72.0 in | Wt 151.0 lb

## 2011-04-11 DIAGNOSIS — J449 Chronic obstructive pulmonary disease, unspecified: Secondary | ICD-10-CM

## 2011-04-11 DIAGNOSIS — F17200 Nicotine dependence, unspecified, uncomplicated: Secondary | ICD-10-CM

## 2011-04-11 DIAGNOSIS — Z1331 Encounter for screening for depression: Secondary | ICD-10-CM

## 2011-04-11 DIAGNOSIS — R636 Underweight: Secondary | ICD-10-CM

## 2011-04-11 DIAGNOSIS — Z9181 History of falling: Secondary | ICD-10-CM

## 2011-04-11 DIAGNOSIS — I10 Essential (primary) hypertension: Secondary | ICD-10-CM

## 2011-04-11 DIAGNOSIS — J4489 Other specified chronic obstructive pulmonary disease: Secondary | ICD-10-CM

## 2011-04-11 DIAGNOSIS — E441 Mild protein-calorie malnutrition: Secondary | ICD-10-CM | POA: Insufficient documentation

## 2011-04-11 NOTE — Progress Notes (Addendum)
  Subjective:    Patient ID: Willie Ward, male    DOB: [birth year], 71 y.o.   MRN: [id number]  HPI HTN- He restarted his BP meds and is doing well. No CP or SOB. No SE.   COPD - Says has been gdoind well on the Spiriva but not using it every day.  No SE.  Says he feels better than he has in a long time.    Review of Systems     Objective:   Physical Exam  Constitutional: He is oriented to person, place, and time. He appears well-developed and well-nourished.  HENT:  Head: Normocephalic and atraumatic.  Cardiovascular: Normal rate, regular rhythm and normal heart sounds.   Pulmonary/Chest: Effort normal and breath sounds normal.  Neurological: He is alert and oriented to person, place, and time.  Skin: Skin is warm and dry.  Psychiatric: He has a normal mood and affect. His behavior is normal.          Assessment & Plan:  HTN-doing well. At goal.  He would like ot f/u in 2 months.   COPD-Discussed using spiriva daily in stead of prn. Discused how the med works to help maintain her currnet lung functioning.  followup in 2 months to make sure that he is using it consistently and not having any problems. We again discussed smoking cessation. He says he still struggling with this. He knows he needs to quit and has been trying to cut back but he has not fully ready to quit.  Followup risk assessment score was 9 which is moderate risk. I will discuss with him an evaluation through the physical therapy department for balance problems.  Depression screen-pH T9 score of 3 which is normal.

## 2011-04-11 NOTE — Telephone Encounter (Signed)
Call patient. On the questionnaire that he completed for a followup assessment he did screen positive for a moderate risk. If he is interested in going to physical therapy Department for a balance and fall evaluation and please let me know and we will be happy to schedule him.

## 2011-04-12 NOTE — Telephone Encounter (Signed)
Pt notified and will check with insurance co to see what his coverage is and will call back

## 2011-04-25 ENCOUNTER — Other Ambulatory Visit: Payer: Self-pay | Admitting: Family Medicine

## 2011-04-26 ENCOUNTER — Other Ambulatory Visit: Payer: Self-pay | Admitting: Family Medicine

## 2011-04-26 MED ORDER — ALPRAZOLAM 1 MG PO TABS: 1.0000 mg | ORAL_TABLET | Freq: Two times a day (BID) | ORAL | Status: DC | PRN

## 2011-04-26 NOTE — Progress Notes (Signed)
We are trying to wan his xanax. Unfortunately it was supposed to be weaned each month starting back in October.  Today dec to 50. Next month dec to 40 tabs.

## 2011-05-21 ENCOUNTER — Other Ambulatory Visit: Payer: Self-pay | Admitting: Family Medicine

## 2011-05-31 ENCOUNTER — Inpatient Hospital Stay (HOSPITAL_COMMUNITY)
Admission: AD | Admit: 2011-05-31 | Discharge: 2011-06-13 | DRG: 026 | Disposition: A | Discharge status: Home / Self Care | Payer: Medicare Other | Source: Other Acute Inpatient Hospital | Attending: Neurological Surgery | Admitting: Neurological Surgery

## 2011-05-31 ENCOUNTER — Encounter (HOSPITAL_COMMUNITY): Payer: Self-pay | Admitting: Neurological Surgery

## 2011-05-31 ENCOUNTER — Encounter (HOSPITAL_COMMUNITY): Payer: Self-pay | Admitting: Anesthesiology

## 2011-05-31 ENCOUNTER — Inpatient Hospital Stay (HOSPITAL_COMMUNITY): Payer: Medicare Other | Admitting: Anesthesiology

## 2011-05-31 ENCOUNTER — Encounter (HOSPITAL_COMMUNITY)
Admission: AD | Disposition: A | Discharge status: Home / Self Care | Payer: Self-pay | Source: Other Acute Inpatient Hospital | Attending: Neurological Surgery

## 2011-05-31 DIAGNOSIS — F10931 Alcohol use, unspecified with withdrawal delirium: Secondary | ICD-10-CM | POA: Diagnosis not present

## 2011-05-31 DIAGNOSIS — M549 Dorsalgia, unspecified: Secondary | ICD-10-CM | POA: Diagnosis present

## 2011-05-31 DIAGNOSIS — F17200 Nicotine dependence, unspecified, uncomplicated: Secondary | ICD-10-CM | POA: Diagnosis present

## 2011-05-31 DIAGNOSIS — F10231 Alcohol dependence with withdrawal delirium: Secondary | ICD-10-CM | POA: Diagnosis not present

## 2011-05-31 DIAGNOSIS — I10 Essential (primary) hypertension: Secondary | ICD-10-CM | POA: Diagnosis present

## 2011-05-31 DIAGNOSIS — Z8546 Personal history of malignant neoplasm of prostate: Secondary | ICD-10-CM

## 2011-05-31 DIAGNOSIS — J449 Chronic obstructive pulmonary disease, unspecified: Secondary | ICD-10-CM | POA: Diagnosis present

## 2011-05-31 DIAGNOSIS — J4489 Other specified chronic obstructive pulmonary disease: Secondary | ICD-10-CM | POA: Diagnosis present

## 2011-05-31 DIAGNOSIS — S065X0A Traumatic subdural hemorrhage without loss of consciousness, initial encounter: Principal | ICD-10-CM | POA: Diagnosis present

## 2011-05-31 DIAGNOSIS — F1020 Alcohol dependence, uncomplicated: Secondary | ICD-10-CM | POA: Diagnosis present

## 2011-05-31 DIAGNOSIS — Y998 Other external cause status: Secondary | ICD-10-CM

## 2011-05-31 DIAGNOSIS — S065X9A Traumatic subdural hemorrhage with loss of consciousness of unspecified duration, initial encounter: Secondary | ICD-10-CM

## 2011-05-31 DIAGNOSIS — Z9181 History of falling: Secondary | ICD-10-CM

## 2011-05-31 DIAGNOSIS — R296 Repeated falls: Secondary | ICD-10-CM | POA: Diagnosis present

## 2011-05-31 HISTORY — DX: Essential (primary) hypertension: I10

## 2011-05-31 HISTORY — PX: CRANIOTOMY: SHX93

## 2011-05-31 HISTORY — DX: Major depressive disorder, single episode, unspecified: F32.9

## 2011-05-31 HISTORY — DX: Cerebral infarction, unspecified: I63.9

## 2011-05-31 HISTORY — DX: Chronic obstructive pulmonary disease, unspecified: J44.9

## 2011-05-31 HISTORY — DX: Shortness of breath: R06.02

## 2011-05-31 HISTORY — DX: Unspecified convulsions: R56.9

## 2011-05-31 HISTORY — DX: Depression, unspecified: F32.A

## 2011-05-31 LAB — MRSA PCR SCREENING: MRSA by PCR: NEGATIVE

## 2011-05-31 SURGERY — CRANIOTOMY HEMATOMA EVACUATION SUBDURAL
Anesthesia: General | Site: Head | Laterality: Right | Wound class: Clean

## 2011-05-31 MED ORDER — ACETAMINOPHEN 650 MG RE SUPP: 650.0000 mg | RECTAL | Status: DC | PRN

## 2011-05-31 MED ORDER — CEFAZOLIN SODIUM 1-5 GM-% IV SOLN
INTRAVENOUS | Status: DC | PRN
  Administered 2011-05-31: 1 g via INTRAVENOUS

## 2011-05-31 MED ORDER — ONDANSETRON HCL 4 MG/2ML IJ SOLN
INTRAMUSCULAR | Status: DC | PRN
  Administered 2011-05-31: 4 mg via INTRAVENOUS

## 2011-05-31 MED ORDER — MIDAZOLAM HCL 5 MG/5ML IJ SOLN
INTRAMUSCULAR | Status: DC | PRN
  Administered 2011-05-31: 2 mg via INTRAVENOUS

## 2011-05-31 MED ORDER — BACITRACIN ZINC 500 UNIT/GM EX OINT
TOPICAL_OINTMENT | CUTANEOUS | Status: DC | PRN
  Administered 2011-05-31: 1 via TOPICAL

## 2011-05-31 MED ORDER — HYDROMORPHONE HCL PF 1 MG/ML IJ SOLN
INTRAMUSCULAR | Status: AC
  Filled 2011-05-31: qty 1

## 2011-05-31 MED ORDER — ENSURE COMPLETE PO LIQD
237.0000 mL | Freq: Three times a day (TID) | ORAL | Status: DC
  Administered 2011-06-01 – 2011-06-13 (×36): 237 mL via ORAL

## 2011-05-31 MED ORDER — METOCLOPRAMIDE HCL 5 MG/ML IJ SOLN
10.0000 mg | Freq: Once | INTRAMUSCULAR | Status: AC | PRN
  Filled 2011-05-31: qty 2

## 2011-05-31 MED ORDER — LISINOPRIL 40 MG PO TABS
40.0000 mg | ORAL_TABLET | Freq: Every day | ORAL | Status: DC
  Administered 2011-06-01 – 2011-06-13 (×9): 40 mg via ORAL
  Filled 2011-05-31 (×13): qty 1

## 2011-05-31 MED ORDER — SENNOSIDES-DOCUSATE SODIUM 8.6-50 MG PO TABS
1.0000 | ORAL_TABLET | Freq: Two times a day (BID) | ORAL | Status: DC
  Administered 2011-06-01 – 2011-06-13 (×26): 1 via ORAL
  Filled 2011-05-31 (×26): qty 1

## 2011-05-31 MED ORDER — SODIUM CHLORIDE 0.9 % IR SOLN
Status: DC | PRN
  Administered 2011-05-31: 22:00:00

## 2011-05-31 MED ORDER — FLUOXETINE HCL 20 MG PO CAPS
40.0000 mg | ORAL_CAPSULE | Freq: Every day | ORAL | Status: DC
  Administered 2011-06-01 – 2011-06-13 (×13): 40 mg via ORAL
  Filled 2011-05-31 (×13): qty 2

## 2011-05-31 MED ORDER — FENTANYL CITRATE 0.05 MG/ML IJ SOLN
INTRAMUSCULAR | Status: DC | PRN
  Administered 2011-05-31: 100 ug via INTRAVENOUS

## 2011-05-31 MED ORDER — MORPHINE SULFATE 2 MG/ML IJ SOLN
1.0000 mg | INTRAMUSCULAR | Status: DC | PRN
  Administered 2011-06-01 – 2011-06-02 (×5): 2 mg via INTRAVENOUS
  Administered 2011-06-02 (×2): 1 mg via INTRAVENOUS
  Administered 2011-06-02 – 2011-06-04 (×11): 2 mg via INTRAVENOUS
  Administered 2011-06-04 – 2011-06-05 (×4): 4 mg via INTRAVENOUS
  Administered 2011-06-05: 2 mg via INTRAVENOUS
  Administered 2011-06-05: 4 mg via INTRAVENOUS
  Filled 2011-05-31 (×2): qty 1
  Filled 2011-05-31: qty 2
  Filled 2011-05-31 (×2): qty 1
  Filled 2011-05-31 (×2): qty 2
  Filled 2011-05-31: qty 1
  Filled 2011-05-31: qty 2
  Filled 2011-05-31 (×5): qty 1
  Filled 2011-05-31: qty 2
  Filled 2011-05-31 (×5): qty 1
  Filled 2011-05-31: qty 2
  Filled 2011-05-31 (×3): qty 1
  Filled 2011-05-31: qty 2

## 2011-05-31 MED ORDER — ONDANSETRON HCL 4 MG/2ML IJ SOLN: 4.0000 mg | Freq: Four times a day (QID) | INTRAMUSCULAR | Status: DC | PRN

## 2011-05-31 MED ORDER — ALPRAZOLAM 0.5 MG PO TABS
1.0000 mg | ORAL_TABLET | Freq: Three times a day (TID) | ORAL | Status: DC | PRN
  Administered 2011-06-01 – 2011-06-13 (×25): 1 mg via ORAL
  Filled 2011-05-31 (×3): qty 2
  Filled 2011-05-31: qty 1
  Filled 2011-05-31: qty 2
  Filled 2011-05-31 (×2): qty 1
  Filled 2011-05-31 (×9): qty 2
  Filled 2011-05-31: qty 1
  Filled 2011-05-31 (×2): qty 2
  Filled 2011-05-31: qty 1
  Filled 2011-05-31: qty 2
  Filled 2011-05-31: qty 1
  Filled 2011-05-31 (×3): qty 2
  Filled 2011-05-31 (×3): qty 1
  Filled 2011-05-31 (×3): qty 2
  Filled 2011-05-31: qty 1

## 2011-05-31 MED ORDER — ACETAMINOPHEN 325 MG PO TABS
650.0000 mg | ORAL_TABLET | ORAL | Status: DC | PRN
  Administered 2011-06-04: 650 mg via ORAL
  Filled 2011-05-31: qty 1

## 2011-05-31 MED ORDER — DEXAMETHASONE SODIUM PHOSPHATE 4 MG/ML IJ SOLN
INTRAMUSCULAR | Status: DC | PRN
  Administered 2011-05-31: 8 mg via INTRAVENOUS

## 2011-05-31 MED ORDER — PHENYLEPHRINE HCL 10 MG/ML IJ SOLN
INTRAMUSCULAR | Status: DC | PRN
  Administered 2011-05-31 (×3): 100 ug via INTRAVENOUS

## 2011-05-31 MED ORDER — PANTOPRAZOLE SODIUM 40 MG IV SOLR
40.0000 mg | Freq: Every day | INTRAVENOUS | Status: DC
  Administered 2011-06-01: 40 mg via INTRAVENOUS
  Filled 2011-05-31 (×2): qty 40

## 2011-05-31 MED ORDER — MICROFIBRILLAR COLL HEMOSTAT EX PADS
MEDICATED_PAD | CUTANEOUS | Status: DC | PRN
  Administered 2011-05-31: 1 via TOPICAL

## 2011-05-31 MED ORDER — TIOTROPIUM BROMIDE MONOHYDRATE 18 MCG IN CAPS
18.0000 ug | ORAL_CAPSULE | Freq: Every day | RESPIRATORY_TRACT | Status: DC
  Administered 2011-06-01 – 2011-06-13 (×11): 18 ug via RESPIRATORY_TRACT
  Filled 2011-05-31 (×3): qty 5

## 2011-05-31 MED ORDER — 0.9 % SODIUM CHLORIDE (POUR BTL) OPTIME
TOPICAL | Status: DC | PRN
  Administered 2011-05-31 (×2): 1000 mL

## 2011-05-31 MED ORDER — ALBUTEROL SULFATE HFA 108 (90 BASE) MCG/ACT IN AERS
2.0000 | INHALATION_SPRAY | Freq: Four times a day (QID) | RESPIRATORY_TRACT | Status: DC | PRN
  Filled 2011-05-31: qty 6.7

## 2011-05-31 MED ORDER — LACTATED RINGERS IV SOLN
INTRAVENOUS | Status: DC | PRN
  Administered 2011-05-31: 21:00:00 via INTRAVENOUS

## 2011-05-31 MED ORDER — EPHEDRINE SULFATE 50 MG/ML IJ SOLN
INTRAMUSCULAR | Status: DC | PRN
  Administered 2011-05-31 (×3): 10 mg via INTRAVENOUS

## 2011-05-31 MED ORDER — HYDROMORPHONE HCL PF 1 MG/ML IJ SOLN
0.2500 mg | INTRAMUSCULAR | Status: DC | PRN
  Administered 2011-05-31 (×2): 0.5 mg via INTRAVENOUS
  Filled 2011-05-31: qty 1

## 2011-05-31 MED ORDER — SUCCINYLCHOLINE CHLORIDE 20 MG/ML IJ SOLN
INTRAMUSCULAR | Status: DC | PRN
  Administered 2011-05-31: 100 mg via INTRAVENOUS

## 2011-05-31 MED ORDER — METOPROLOL SUCCINATE ER 50 MG PO TB24
50.0000 mg | ORAL_TABLET | Freq: Every day | ORAL | Status: DC
  Administered 2011-06-01 – 2011-06-13 (×10): 50 mg via ORAL
  Filled 2011-05-31 (×13): qty 1

## 2011-05-31 MED ORDER — LEVETIRACETAM ER 500 MG PO TB24
500.0000 mg | ORAL_TABLET | Freq: Every day | ORAL | Status: DC
  Administered 2011-06-01 – 2011-06-13 (×13): 500 mg via ORAL
  Filled 2011-05-31 (×13): qty 1

## 2011-05-31 MED ORDER — PROPOFOL 10 MG/ML IV EMUL
INTRAVENOUS | Status: DC | PRN
  Administered 2011-05-31: 180 mg via INTRAVENOUS

## 2011-05-31 MED ORDER — THROMBIN 20000 UNITS EX KIT
PACK | CUTANEOUS | Status: DC | PRN
  Administered 2011-05-31: 22:00:00 via TOPICAL

## 2011-05-31 MED ORDER — LABETALOL HCL 5 MG/ML IV SOLN: 10.0000 mg | INTRAVENOUS | Status: DC | PRN

## 2011-05-31 MED ORDER — THIAMINE HCL 100 MG/ML IJ SOLN
Freq: Once | INTRAVENOUS | Status: AC
  Administered 2011-06-01: 01:00:00 via INTRAVENOUS
  Filled 2011-05-31: qty 1000

## 2011-05-31 MED ORDER — AMITRIPTYLINE HCL 50 MG PO TABS
50.0000 mg | ORAL_TABLET | Freq: Every day | ORAL | Status: DC
  Administered 2011-06-01 – 2011-06-12 (×12): 50 mg via ORAL
  Filled 2011-05-31 (×15): qty 1

## 2011-05-31 MED ORDER — LIDOCAINE-EPINEPHRINE 1 %-1:100000 IJ SOLN
INTRAMUSCULAR | Status: DC | PRN
  Administered 2011-05-31: 10 mL

## 2011-05-31 SURGICAL SUPPLY — 65 items
BAG DECANTER FOR FLEXI CONT (MISCELLANEOUS) ×2 IMPLANT
BRUSH SCRUB EZ 1% IODOPHOR (MISCELLANEOUS) ×1 IMPLANT
BUR ROUTER D-58 CRANI (BURR) IMPLANT
CANISTER SUCTION 2500CC (MISCELLANEOUS) ×2 IMPLANT
CLIP TI MEDIUM 6 (CLIP) IMPLANT
CLOTH BEACON ORANGE TIMEOUT ST (SAFETY) ×2 IMPLANT
CONT SPEC 4OZ CLIKSEAL STRL BL (MISCELLANEOUS) ×2 IMPLANT
CORDS BIPOLAR (ELECTRODE) ×2 IMPLANT
COVER TABLE BACK 60X90 (DRAPES) IMPLANT
DRAIN CHANNEL 10M FLAT 3/4 FLT (DRAIN) ×1 IMPLANT
DRAPE MICROSCOPE ZEISS OPMI (DRAPES) IMPLANT
DRAPE NEUROLOGICAL W/INCISE (DRAPES) ×2 IMPLANT
DRAPE SURG 17X23 STRL (DRAPES) IMPLANT
DRAPE WARM FLUID 44X44 (DRAPE) ×2 IMPLANT
DRESSING TELFA 8X3 (GAUZE/BANDAGES/DRESSINGS) ×1 IMPLANT
DRSG OPSITE 4X5.5 SM (GAUZE/BANDAGES/DRESSINGS) ×2 IMPLANT
DURAPREP 6ML APPLICATOR 50/CS (WOUND CARE) ×2 IMPLANT
ELECT CAUTERY BLADE 6.4 (BLADE) ×1 IMPLANT
ELECT REM PT RETURN 9FT ADLT (ELECTROSURGICAL) ×2
ELECTRODE REM PT RTRN 9FT ADLT (ELECTROSURGICAL) ×1 IMPLANT
EVACUATOR 1/8 PVC DRAIN (DRAIN) ×1 IMPLANT
EVACUATOR SILICONE 100CC (DRAIN) ×2 IMPLANT
GAUZE SPONGE 4X4 16PLY XRAY LF (GAUZE/BANDAGES/DRESSINGS) IMPLANT
GLOVE BIO SURGEON STRL SZ 6.5 (GLOVE) ×3 IMPLANT
GLOVE BIO SURGEON STRL SZ8 (GLOVE) ×2 IMPLANT
GLOVE BIOGEL PI IND STRL 6.5 (GLOVE) IMPLANT
GLOVE BIOGEL PI IND STRL 7.0 (GLOVE) IMPLANT
GLOVE BIOGEL PI INDICATOR 6.5 (GLOVE) ×3
GLOVE BIOGEL PI INDICATOR 7.0 (GLOVE) ×2
GOWN BRE IMP SLV AUR LG STRL (GOWN DISPOSABLE) ×3 IMPLANT
GOWN BRE IMP SLV AUR XL STRL (GOWN DISPOSABLE) ×1 IMPLANT
GOWN STRL REIN 2XL LVL4 (GOWN DISPOSABLE) ×2 IMPLANT
HEMOSTAT POWDER KIT SURGIFOAM (HEMOSTASIS) IMPLANT
HOOK DURA (MISCELLANEOUS) ×2 IMPLANT
KIT BASIN OR (CUSTOM PROCEDURE TRAY) ×2 IMPLANT
KIT ROOM TURNOVER OR (KITS) ×2 IMPLANT
NEEDLE HYPO 22GX1.5 SAFETY (NEEDLE) ×2 IMPLANT
NS IRRIG 1000ML POUR BTL (IV SOLUTION) ×3 IMPLANT
PACK CRANIOTOMY (CUSTOM PROCEDURE TRAY) ×2 IMPLANT
PAD ARMBOARD 7.5X6 YLW CONV (MISCELLANEOUS) ×3 IMPLANT
PATTIES SURGICAL .25X.25 (GAUZE/BANDAGES/DRESSINGS) IMPLANT
PATTIES SURGICAL .5 X.5 (GAUZE/BANDAGES/DRESSINGS) IMPLANT
PATTIES SURGICAL .5 X3 (DISPOSABLE) IMPLANT
PATTIES SURGICAL 1X1 (DISPOSABLE) IMPLANT
PERFORATOR LRG  14-11MM (BIT) ×1
PERFORATOR LRG 14-11MM (BIT) ×1 IMPLANT
PIN MAYFIELD SKULL DISP (PIN) ×1 IMPLANT
PLATE 1.5  2HOLE LNG NEURO (Plate) ×3 IMPLANT
PLATE 1.5 2HOLE LNG NEURO (Plate) IMPLANT
RUBBERBAND STERILE (MISCELLANEOUS) IMPLANT
SCREW SELF DRILL HT 1.5/4MM (Screw) ×6 IMPLANT
SPONGE GAUZE 4X4 12PLY (GAUZE/BANDAGES/DRESSINGS) IMPLANT
SPONGE NEURO XRAY DETECT 1X3 (DISPOSABLE) IMPLANT
STAPLER VISISTAT 35W (STAPLE) ×1 IMPLANT
SUT ETHILON 3 0 FSL (SUTURE) IMPLANT
SUT NURALON 4 0 TR CR/8 (SUTURE) ×3 IMPLANT
SUT VIC AB 2-0 CP2 18 (SUTURE) ×3 IMPLANT
SYR 20ML ECCENTRIC (SYRINGE) ×2 IMPLANT
SYR CONTROL 10ML LL (SYRINGE) ×2 IMPLANT
TAPE CLOTH SURG 4X10 WHT LF (GAUZE/BANDAGES/DRESSINGS) ×1 IMPLANT
TOWEL OR 17X24 6PK STRL BLUE (TOWEL DISPOSABLE) ×1 IMPLANT
TOWEL OR 17X26 10 PK STRL BLUE (TOWEL DISPOSABLE) ×3 IMPLANT
TRAY FOLEY CATH 14FRSI W/METER (CATHETERS) ×1 IMPLANT
UNDERPAD 30X30 INCONTINENT (UNDERPADS AND DIAPERS) IMPLANT
WATER STERILE IRR 1000ML POUR (IV SOLUTION) ×2 IMPLANT

## 2011-05-31 NOTE — H&P (Signed)
Reason for Consult: SDH after fall Referring Physician: EDP  Willie Ward is an 71 y.o. male.   HPI:  71 year old alcoholic male who states he drinks about 10 beers per day who's had multiple falls. He fell multiple times even today. He states he just loses his balance and falls. States he really hasn't struck his head and has had no loss of consciousness. Has a history of seizures. Has a history of a C2 fracture treated with Dr. Donalee Citrin, last x-rays I can find from 2011. Presented to the ER today with complaints of headache and falls. Headache is progressively worse. No nausea and vomiting. No new numbness tingling or weakness. Head CT showed a right acute subdural hematoma with midline shift a neurosurgical evaluation was requested. Platelet count 203, INR 1.0 at the outside ER.  Past Medical History  Diagnosis Date  . Chronic back pain   . Prostate cancer     Past Surgical History  Procedure Date  . Cataract extraction, bilateral 02/2011    Dr. Lavona Mound.     No Known Allergies  History  Substance Use Topics  . Smoking status: Current Some Day Smoker -- 1.5 packs/day    Types: Cigarettes    Last Attempt to Quit: 08/20/2010  . Smokeless tobacco: Not on file  . Alcohol Use: 14.4 oz/week    24 Cans of beer per week    Family History  Problem Relation Age of Onset  . Heart attack Mother   . Depression Son   . Diabetes Sister   . Hypertension Mother   . Hypertension Brother      Review of Systems  Positive ROS: Otherwise negative  All other systems have been reviewed and were otherwise negative with the exception of those mentioned in the HPI and as above.  Objective: Vital signs in last 24 hours:    General Appearance: Alert, cooperative, no distress, appears stated age Head: Normocephalic, no obvious trauma Eyes: PERRL likely status post cataract extraction and lens implant      Throat: benign Neck: Supple, really nontender to palpation Lungs: Clear to  auscultation bilaterally, respirations unlabored Heart: Regular rate and rhythm Abdomen: Soft, non-tender Extremities: Extremities normal, atraumatic, thin skin with multiple abrasions or bruises Pulses: 2+ and symmetric all extremities  NEUROLOGIC:   Mental status: A&O x4, no aphasia, good attention span, Memory and fund of knowledge appear okay. He is conversant Motor Exam - grossly normal, generally thin, no pronator drift Sensory Exam - grossly normal Coordination - rapid alternating movements are okay  Gait - not tested  Cranial Nerves: I: smell Not tested  II: visual acuity  OS: Seems okay    OD: Seems okay   II: visual fields Full to confrontation  II: pupils Equal, round, reactive to light  III,VII: ptosis None  III,IV,VI: extraocular muscles  Full ROM  V: mastication Normal  V: facial light touch sensation  Normal  V,VII: corneal reflex  Present  VII: facial muscle function - upper  Normal  VII: facial muscle function - lower Normal  VIII: hearing Not tested  IX: soft palate elevation  Normal  IX,X: gag reflex Present  XI: trapezius strength  5/5  XI: sternocleidomastoid strength 5/5  XI: neck flexion strength  5/5  XII: tongue strength  Normal    Data Review Lab Results  Component Value Date   WBC 3.0* 07/20/2009   HGB 13.9 07/20/2009   HCT 41.7 07/20/2009   MCV 111.2* 07/20/2009   PLT 51*  07/20/2009   Lab Results  Component Value Date   NA 134* 02/09/2011   K 4.8 02/09/2011   CL 98 02/09/2011   CO2 28 02/09/2011   BUN 16 02/09/2011   CREATININE 0.93 02/09/2011   GLUCOSE 84 02/09/2011   Lab Results  Component Value Date   INR 1.4 01/11/2008    Radiology: No results found. CT scan: Fairly large right acute to subacute subdural hematoma with some mass effect and some mild shift, mostly located in the right frontal region  Assessment/Plan: Fairly large acute to subacute right subdural hematoma with some mass effect. He is remarkably awake and conversant and fairly  nonfocal. However given the size of the subdural hematoma I would recommend a small frontal craniotomy for evacuation of the lesion. I have explained the surgery to him as best I can in detail. I've explained the reasoning for offering this surgery. He understands the risk of the surgery include but not limited to bleeding, infection, stroke, brain damage, visual loss, numbness tingling or weakness, paralysis, seizure, possible need for further surgery, lack of relief of symptoms or worsening of symptoms, and anesthesia risk including death. He wishes to proceed. He will need delirium tremens coverage postoperatively. We'll CT scan of cervical spine, especially given his history of C2 fracture. He is admitted to the neurosurgical ICU.   Willie Ward S 05/31/2011 9:20 PM

## 2011-05-31 NOTE — Preoperative (Signed)
Beta Blockers   Reason not to administer Beta Blockers:Not Applicable, taken this am 

## 2011-05-31 NOTE — Anesthesia Postprocedure Evaluation (Signed)
  Anesthesia Post-op Note  Patient: Willie Ward  Procedure(s) Performed: Procedure(s) (LRB): CRANIOTOMY HEMATOMA EVACUATION SUBDURAL (Right)  Patient Location: PACU and ICU  Anesthesia Type: General  Level of Consciousness: awake and patient cooperative  Airway and Oxygen Therapy: Patient Spontanous Breathing and Patient connected to face mask oxygen  Post-op Pain: none  Post-op Assessment: Post-op Vital signs reviewed, Patient's Cardiovascular Status Stable, Respiratory Function Stable, Patent Airway, No signs of Nausea or vomiting and Pain level controlled  Post-op Vital Signs: Reviewed and stable  Complications: No apparent anesthesia complications

## 2011-05-31 NOTE — Anesthesia Procedure Notes (Signed)
Procedure Name: Intubation Date/Time: 05/31/2011 9:42 PM Performed by: Aryssa Rosamond S Pre-anesthesia Checklist: Patient identified, Emergency Drugs available, Suction available, Patient being monitored and Timeout performed Patient Re-evaluated:Patient Re-evaluated prior to inductionOxygen Delivery Method: Circle system utilized Preoxygenation: Pre-oxygenation with 100% oxygen Intubation Type: IV induction Ventilation: Mask ventilation without difficulty Laryngoscope Size: Mac and 4 Grade View: Grade I Tube type: Oral Tube size: 7.5 mm Number of attempts: 1 Airway Equipment and Method: Stylet Placement Confirmation: ETT inserted through vocal cords under direct vision,  positive ETCO2 and breath sounds checked- equal and bilateral Secured at: 22 cm Tube secured with: Tape Dental Injury: Teeth and Oropharynx as per pre-operative assessment

## 2011-05-31 NOTE — Op Note (Signed)
05/31/2011  10:57 PM  PATIENT:  Willie Ward  71 y.o. male  PRE-OPERATIVE DIAGNOSIS:  Right subdural hematoma  POST-OPERATIVE DIAGNOSIS:  Same  PROCEDURE:  Right craniotomy for subdural hematoma evacuation  SURGEON:  Marikay Alar, MD  ASSISTANTS: None  ANESTHESIA:   General  EBL: 200 ml  Total I/O In: 1300 [I.V.:1300] Out: 825 [Urine:625; Blood:200]  BLOOD ADMINISTERED:none  DRAINS: J-P drains   SPECIMEN:  No Specimen  INDICATION FOR PROCEDURE: This is a 71 year old alcoholic gentleman who was referred from the outside emergency department with a right subdural hematoma. It was fairly large and causing mass effect. I recommended a right craniotomy for evacuation of the subdural hematoma. Patient understood the risks, benefits, and alternatives and potential outcomes and wished to proceed.  PROCEDURE DETAILS: The patient was taken to the operating room and after induction of adequate generalized endotracheal anesthesia, the head was affixed in a 3 point Mayfield head rest, and turned to the left to expose the right frontotemporal parietal region. The head was shaved and then cleaned with Hibiclens and then prepped with DuraPrep and draped in the usual sterile fashion. 10 cc of local anesthetic was injected, and a curvy linear incision was made on the right of the head in the frontal region. Raney clips were placed to establish hemostasis of the scalp, the muscle was reflected with the scalp flap, to expose the right frontal region. A burr hole was placed in the temporal region and near the keyhole, and a craniotomy flap was turned utilizing the high-speed, air poweedr drill. The flap was then placed in bacitracin-containing saline solution, and the dura was opened to expose the right frontal region. A hematoma was then removed with a combination of irrigation and suction. I opened a membrane from the frontal to the parietal region. I continued to irrigate until the irrigant was clear  to, and dried any bleeding with bipolar cautery. I then placed a subdural drain through separate stab incision and close the dura with a running 4-0 Nurolon suture. The dura was lined with Gelfoam, and the craniotomy flap was replaced with doggie-bone plates. The wound was copiously irrigated. A subgaleal drain was placed, and the galea was then closed with interrupted 2-0 Vicryl suture. The skin was then closed with staples a sterile dressing was applied. The patient was then taken out of the 3-point Mayfield headrest and awakened from general anesthesia, and transported to the recovery room in stable condition. At the end of the procedure all sponge, needle, and instrument counts were correct.   PLAN OF CARE: Admit to inpatient   PATIENT DISPOSITION:  PACU - hemodynamically stable.   Delay start of Pharmacological VTE agent (>24hrs) due to surgical blood loss or risk of bleeding:  yes

## 2011-05-31 NOTE — Anesthesia Preprocedure Evaluation (Addendum)
Anesthesia Evaluation  Patient identified by MRN, date of birth, ID band Patient awake    Reviewed: Allergy & Precautions, H&P , NPO status , Patient's Chart, lab work & pertinent test results, reviewed documented beta blocker date and time   Airway Mallampati: II TM Distance: >3 FB Neck ROM: full    Dental  (+) Dental Advisory Given and Poor Dentition   Pulmonary COPDCurrent Smoker,          Cardiovascular hypertension, On Medications and On Home Beta Blockers     Neuro/Psych  Headaches, PSYCHIATRIC DISORDERS CVA, Residual Symptoms    GI/Hepatic negative GI ROS, Neg liver ROS,   Endo/Other  negative endocrine ROS  Renal/GU negative Renal ROS  negative genitourinary   Musculoskeletal   Abdominal   Peds  Hematology negative hematology ROS (+)   Anesthesia Other Findings See surgeon's H&P   Reproductive/Obstetrics negative OB ROS                          Anesthesia Physical Anesthesia Plan  ASA: IV and Emergent  Anesthesia Plan: General   Post-op Pain Management:    Induction: Intravenous  Airway Management Planned: Oral ETT  Additional Equipment: Arterial line  Intra-op Plan:   Post-operative Plan: Possible Post-op intubation/ventilation  Informed Consent: I have reviewed the patients History and Physical, chart, labs and discussed the procedure including the risks, benefits and alternatives for the proposed anesthesia with the patient or authorized representative who has indicated his/her understanding and acceptance.   Dental advisory given  Plan Discussed with: CRNA, Surgeon and Anesthesiologist  Anesthesia Plan Comments:       Anesthesia Quick Evaluation

## 2011-05-31 NOTE — Transfer of Care (Signed)
Immediate Anesthesia Transfer of Care Note  Patient: Willie Ward  Procedure(s) Performed: Procedure(s) (LRB): CRANIOTOMY HEMATOMA EVACUATION SUBDURAL (Right)  Patient Location: Neuro ICU  Anesthesia Type: General  Level of Consciousness: awake, alert , oriented, patient cooperative and responds to stimulation  Airway & Oxygen Therapy: Patient Spontanous Breathing and Patient connected to nasal cannula oxygen  Post-op Assessment: Report given to PACU RN, Neuro RN Post -op Vital signs reviewed and stable and Patient moving all extremities  Post vital signs: Reviewed and stable  Complications: No apparent anesthesia complications

## 2011-06-01 ENCOUNTER — Encounter (HOSPITAL_COMMUNITY): Payer: Self-pay | Admitting: Neurology

## 2011-06-01 ENCOUNTER — Inpatient Hospital Stay (HOSPITAL_COMMUNITY): Payer: Medicare Other

## 2011-06-01 MED ORDER — LORAZEPAM 2 MG/ML IJ SOLN
0.0000 mg | Freq: Two times a day (BID) | INTRAMUSCULAR | Status: AC
  Administered 2011-06-04: 1 mg via INTRAVENOUS
  Administered 2011-06-05: 2 mg via INTRAVENOUS
  Filled 2011-06-01 (×2): qty 1

## 2011-06-01 MED ORDER — LORAZEPAM 2 MG/ML IJ SOLN
1.0000 mg | Freq: Four times a day (QID) | INTRAMUSCULAR | Status: AC | PRN
  Filled 2011-06-01 (×3): qty 1

## 2011-06-01 MED ORDER — LORAZEPAM 2 MG/ML IJ SOLN: 1.0000 mg | INTRAMUSCULAR | Status: DC | PRN

## 2011-06-01 MED ORDER — LORAZEPAM 1 MG PO TABS: 1.0000 mg | ORAL_TABLET | ORAL | Status: DC | PRN

## 2011-06-01 MED ORDER — LORAZEPAM 2 MG/ML IJ SOLN
0.0000 mg | INTRAMUSCULAR | Status: AC
  Administered 2011-06-01 – 2011-06-02 (×2): 1 mg via INTRAVENOUS
  Administered 2011-06-02: 2 mg via INTRAVENOUS
  Administered 2011-06-02 (×3): 1 mg via INTRAVENOUS
  Filled 2011-06-01 (×3): qty 1

## 2011-06-01 MED ORDER — LORAZEPAM 1 MG PO TABS
1.0000 mg | ORAL_TABLET | Freq: Four times a day (QID) | ORAL | Status: AC | PRN
  Administered 2011-06-01 (×2): 1 mg via ORAL
  Filled 2011-06-01 (×2): qty 1

## 2011-06-01 NOTE — Progress Notes (Signed)
06-01-11 UR review completed.  Allicia Culley RN BSN 

## 2011-06-01 NOTE — Evaluation (Signed)
Speech Language Pathology Evaluation Patient Details Name: Willie Ward MRN: [id number] DOB: 03-19-1940 Today's Date: 06/01/2011  Problem List:  Patient Active Problem List  Diagnoses  . Chronic low back pain  . History of prostate cancer  . Generalized anxiety disorder  . Insomnia  . Essential hypertension, benign  . Cataract  . Adenocarcinoma of prostate  . Moderate COPD (chronic obstructive pulmonary disease)  . Tobacco abuse  . Underweight   Past Medical History:  Past Medical History  Diagnosis Date  . Chronic back pain   . Prostate cancer   . Depression   . COPD (chronic obstructive pulmonary disease)   . Shortness of breath   . Hypertension   . Stroke   . Seizures    Past Surgical History:  Past Surgical History  Procedure Date  . Cataract extraction, bilateral 02/2011    Dr. Lavona Mound.     SLP Assessment/Plan/Recommendation Clinical Impression Statement: 71 year old alcoholic male who states he drinks about 10 beers per day who's had multiple falls, denied any LOC,  istory of seizures and C2 fracture who presented to the ER 4/25 with complaints of headache and falls.  Head CT showed a right acute subdural hematoma with midline shift, s/p right SDH evacuation with craniotomy.  Patient presents with borderline mild memory deficits particularly with complex, new information.  Patient is recalling events since his surgery and reports that these memory issues were occuring during the 6 weeks of related falls.  Patient also reports attentional issues especially when drinking alcohol.  Completed education regarding safety while on acute care.  No skilled acute SLP services.  Suggest PT/OT assess his functional safety and recall of precautions in order to determine if he can return to his mobile home alone.  SLP Recommendation/Assessment: Patient does not need any further Speech Lanaguage Pathology Services No Skilled Speech Therapy: All education completed;Patient at baseline  level of functioning SLP Recommendations Follow up Recommendations: None Equipment Recommended: None recommended by SLP Individuals Consulted Consulted and Agree with Results and Recommendations: Patient  SLP Evaluation Prior Functioning Cognitive/Linguistic Baseline: Baseline deficits Baseline deficit details: Patient reports attentional and memory deficits over the last 6 weeks with at least 9 falls. Type of Home: Mobile home Lives With: Alone (Friend comes by everday) Available Help at Discharge: Friend(s) Education: Psychologist, occupational Vocation: Retired  IT consultant Overall Cognitive Status: Impaired at baseline Arousal/Alertness: Awake/alert Orientation Level: Oriented X4 Attention: Selective Selective Attention: Appears intact Memory: Impaired Memory Impairment: Decreased recall of new information (Complex information) Awareness: Appears intact Problem Solving: Appears intact Safety/Judgment: Appears intact  Comprehension Auditory Comprehension Overall Auditory Comprehension: Appears within functional limits for tasks assessed Visual Recognition/Discrimination Discrimination: Within Function Limits Reading Comprehension Reading Status: Within funtional limits  Expression Expression Primary Mode of Expression: Verbal Verbal Expression Overall Verbal Expression: Appears within functional limits for tasks assessed Written Expression Dominant Hand: Right Written Expression: Not tested  Oral/Motor Oral Motor/Sensory Function Overall Oral Motor/Sensory Function: Appears within functional limits for tasks assessed Motor Speech Overall Motor Speech: Appears within functional limits for tasks assessed Motor Planning: Witnin functional limits  Myra Rude, M.S.,CCC-SLP Pager [phone] 06/01/2011, 10:37 AM

## 2011-06-01 NOTE — Plan of Care (Signed)
Problem: Consults Goal: Diagnosis - Craniotomy Craniotomy for SDH evacuation

## 2011-06-01 NOTE — Evaluation (Signed)
Physical Therapy Evaluation Patient Details Name: Willie Ward MRN: [id number] DOB: 05/26/1940 Today's Date: 06/01/2011 Time:  -     PT Assessment / Plan / Recommendation Clinical Impression  Pt presents s/p craniotomy with decreased functional strength and balance. Pt has had numerous recent falls at home and would benefit from skilled PT in the acute care setting in order to increase functional mobility and safety prior to d/c    PT Assessment  Patient needs continued PT services    Follow Up Recommendations  Home health PT;Supervision/Assistance - 24 hour (If no 24 hr available, recommend [facility]Re[facility])    Equipment Recommendations  None recommended by PT;None recommended by SLP    Frequency Min 4X/week    Precautions / Restrictions Precautions Precautions: Fall Restrictions Weight Bearing Restrictions: No   Pertinent Vitals/Pain Pt's O2 sat dropped to 91 upon sitting, with 2L of O2, pt able to maintain 96-99 with ambulation.       Mobility  Bed Mobility Bed Mobility: Supine to Sit;Sitting - Scoot to Edge of Bed Supine to Sit: 4: Min guard Sitting - Scoot to [place] of Bed: 6: Modified independent (Device/Increase time) Sit to Supine: 5: Supervision Details for Bed Mobility Assistance: VC for sequencing and safety.  Transfers Transfers: Sit to Stand;Stand to Sit Sit to Stand: 4: Min assist;With upper extremity assist;From bed Stand to Sit: 4: Min assist;With upper extremity assist;To chair/3-in-1 Details for Transfer Assistance: VC for hand placement and safety to RW. Assist for stability Ambulation/Gait Ambulation/Gait Assistance: 3: Mod assist Ambulation Distance (Feet): 40 Feet Assistive device: Rolling walker Ambulation/Gait Assistance Details: VC for sequencing and safety with RW. Postural cues throughout Gait Pattern: Step-to pattern;Decreased hip/knee flexion - left;Decreased hip/knee flexion - right;Decreased stride length;Trunk flexed;Narrow base of  support Gait velocity: decreased gait speed        PT Goals Acute Rehab PT Goals PT Goal Formulation: With patient Time For Goal Achievement: 06/15/11 Potential to Achieve Goals: Fair Pt will go Supine/Side to Sit: with modified independence PT Goal: Supine/Side to Sit - Progress: Goal set today Pt will go Sit to Supine/Side: with modified independence PT Goal: Sit to Supine/Side - Progress: Goal set today Pt will go Sit to Stand: with modified independence PT Goal: Sit to Stand - Progress: Goal set today Pt will go Stand to Sit: with modified independence PT Goal: Stand to Sit - Progress: Goal set today Pt will Transfer Bed to Chair/Chair to Bed: with supervision PT Transfer Goal: Bed to Chair/Chair to Bed - Progress: Goal set today Pt will Ambulate: >150 feet;with supervision;with least restrictive assistive device PT Goal: Ambulate - Progress: Goal set today  Visit Information  Last PT Received On: 06/01/11       Prior Functioning  Home Living Lives With: Alone Available Help at Discharge: Friend(s);Available PRN/intermittently Type of Home: Mobile home Home Access: Stairs to enter Entrance Stairs-Number of Steps: 2 Entrance Stairs-Rails: None Home Layout: One level Bathroom Shower/Tub: Walk-in shower;Door [employer] Toilet: Standard Bathroom Accessibility: Yes How Accessible: Accessible via walker Home Adaptive Equipment: Walker - rolling;Straight cane Prior Function Level of Independence: Independent Able to Take Stairs?: Yes Driving: No Vocation: Retired Musician: No difficulties Dominant Hand: Right    Cognition  Overall Cognitive Status: Appears within functional limits for tasks assessed/performed Arousal/Alertness: Awake/alert Orientation Level: Oriented X4 / Intact Behavior During Session: WFL for tasks performed    Extremity/Trunk Assessment Right Lower Extremity Assessment RLE ROM/Strength/Tone: Within functional levels RLE  Sensation: WFL - Light Touch RLE  Coordination: WFL - gross/fine motor Left Lower Extremity Assessment LLE ROM/Strength/Tone: Within functional levels LLE Sensation: WFL - Light Touch LLE Coordination: WFL - gross/fine motor   Balance    End of Session PT - End of Session Equipment Utilized During Treatment: Gait belt Activity Tolerance: Patient tolerated treatment well Patient left: in bed;with call bell/phone within reach;with nursing in room Nurse Communication: Mobility status   Milana Kidney 06/01/2011, 5:16 PM  06/01/2011 Milana Kidney DPT PAGER: [phone] OFFICE: [phone]

## 2011-06-01 NOTE — Progress Notes (Signed)
Patient ID: Willie Ward, male   DOB: Apr 13, 1940, 71 y.o.   MRN: [id number] Subjective: Patient reports some mild headache, no new numbness tingling or weakness.  Objective: Vital signs in last 24 hours: Temp:  [97.3 F (36.3 C)] 97.3 F (36.3 C) (04/26 0400) Pulse Rate:  [63-80] 66  (04/26 0700) Resp:  [10-14] 11  (04/26 0700) BP: (121-174)/(66-87) 152/78 mmHg (04/26 0600) SpO2:  [95 %-100 %] 98 % (04/26 0700) Weight:  [70.1 kg (154 lb 8.7 oz)] 70.1 kg (154 lb 8.7 oz) (04/26 0100)  Intake/Output from previous day: 04/25 0701 - 04/26 0700 In: 1750 [I.V.:1750] Out: 2258 [Urine:1800; Drains:258; Blood:200] Intake/Output this shift:    Neurologic: Grossly normal, awake and alert, some disorientation, dressing dry, no pronator drift, follows commands and is conversant  Lab Results: Lab Results  Component Value Date   WBC 3.0* 07/20/2009   HGB 13.9 07/20/2009   HCT 41.7 07/20/2009   MCV 111.2* 07/20/2009   PLT 51* 07/20/2009   Lab Results  Component Value Date   INR 1.4 01/11/2008   BMET Lab Results  Component Value Date   NA 134* 02/09/2011   K 4.8 02/09/2011   CL 98 02/09/2011   CO2 28 02/09/2011   GLUCOSE 84 02/09/2011   BUN 16 02/09/2011   CREATININE 0.93 02/09/2011   CALCIUM 9.3 02/09/2011    Studies/Results: No results found.  Assessment/Plan: Doing well. Head CT looks fine postcraniotomy. Mobilized today. Continue thiamine and folate and watch out for DTs. CT of the cervical spine shows that his old C2 fracture has healed.   LOS: 1 day    Hermilo Dutter S 06/01/2011, 7:38 AM

## 2011-06-02 MED ORDER — PANTOPRAZOLE SODIUM 40 MG PO TBEC
40.0000 mg | DELAYED_RELEASE_TABLET | Freq: Every day | ORAL | Status: DC
  Administered 2011-06-03 – 2011-06-13 (×11): 40 mg via ORAL
  Filled 2011-06-02 (×9): qty 1

## 2011-06-02 NOTE — Progress Notes (Signed)
Patient ID: Willie Ward, male   DOB: Oct 30, 1940, 71 y.o.   MRN: [id number] Patient looks great. He is out of bed to a chair. He is awake and alert and conversant moves all extremities. No real signs of DTs. Subgaleal drain has been removed, subdural drain is in place. Complains of some headache.

## 2011-06-02 NOTE — Progress Notes (Signed)
Patient ID: Willie Ward, male   DOB: August 03, 1940, 71 y.o.   MRN: [id number] Alert oriented no distress. No evidence of a cortical drift today. Patient has a subdural drains present one of which is putting out substantial fluid. CT from yesterday reviewed them and straightening significant atrophy and small residual subdural clear fluid collection consistent with hygroma. At this time we will leave the drains in place. Plan DC Foley catheter. Continue mobilizing in intensive care unit.  C-spine CT reviewed demonstrates healed C2 fracture with slight anterior displacement of ring of C1. Otherwise diffuse spondylosis noted.

## 2011-06-03 NOTE — Progress Notes (Signed)
Patient ID: Willie Ward, male   DOB: 1940-02-08, 71 y.o.   MRN: [id number] Alert oriented vital signs are stable. Offers no complaints. Subdural drain in place.  No evidence of a drift motor function and speech are normal. Incision is clean and dry.  Stable postoperative  day 3. Continue supportive care

## 2011-06-04 ENCOUNTER — Encounter (HOSPITAL_COMMUNITY): Payer: Self-pay | Admitting: Radiology

## 2011-06-04 ENCOUNTER — Inpatient Hospital Stay (HOSPITAL_COMMUNITY): Payer: Medicare Other

## 2011-06-04 NOTE — Progress Notes (Signed)
Patient ID: Willie Ward, male   DOB: [birth year], 71 y.o.   MRN: [id number] Subjective: Patient reports he's doing well. No headache, No N/T/W.  Objective: Vital signs in last 24 hours: Temp:  [97.7 F (36.5 C)-98.5 F (36.9 C)] 97.8 F (36.6 C) (04/29 0400) Pulse Rate:  [72-85] 83  (04/29 0700) Resp:  [15-21] 19  (04/29 0700) BP: (98-147)/(55-95) 133/68 mmHg (04/29 0700) SpO2:  [91 %-97 %] 94 % (04/29 0700) Weight:  [67.4 kg (148 lb 9.4 oz)] 67.4 kg (148 lb 9.4 oz) (04/29 0400)  Intake/Output from previous day: 04/28 0701 - 04/29 0700 In: 1440 [P.O.:1440] Out: 1181 [Urine:775; Drains:405; Stool:1] Intake/Output this shift:    Neurologic: Grossly normal  Lab Results: Lab Results  Component Value Date   WBC 3.0* 07/20/2009   HGB 13.9 07/20/2009   HCT 41.7 07/20/2009   MCV 111.2* 07/20/2009   PLT 51* 07/20/2009   Lab Results  Component Value Date   INR 1.4 01/11/2008   BMET Lab Results  Component Value Date   NA 134* 02/09/2011   K 4.8 02/09/2011   CL 98 02/09/2011   CO2 28 02/09/2011   GLUCOSE 84 02/09/2011   BUN 16 02/09/2011   CREATININE 0.93 02/09/2011   CALCIUM 9.3 02/09/2011    Studies/Results: Ct Head Wo Contrast  06/04/2011  *RADIOLOGY REPORT*  Clinical Data: Followup craniotomy for intracranial hemorrhage.  CT HEAD WITHOUT CONTRAST  Technique:  Contiguous axial images were obtained from the base of the skull through the vertex without contrast.  Comparison: 06/01/2011.  Findings: Post right frontal craniotomy for drainage of subdural collection with drain remaining in place.  The amount of pneumocephalus has decreased slightly.  Decrease in size of the hypodense subdural collection posterior right frontal - temporal and parietal lobe.  There remains extra- axial blood adjacent to the craniotomy with mild mass effect upon the right frontal horn.  Midline shift to the left by 2.8 mm.  Small vessel disease type changes.  No CT evidence of large acute thrombotic infarct.  No  intracranial mass lesion detected on this unenhanced exam.  Vascular calcifications.  IMPRESSION: Decrease in size of right sided extra-axial collection as detailed above.  Original Report Authenticated By: Fuller Canada, M.D.    Assessment/Plan: Doing well. Head CT reviewed. Drain removed. To floor.   LOS: 4 days    Teneshia Hedeen S 06/04/2011, 8:08 AM

## 2011-06-04 NOTE — Plan of Care (Signed)
Problem: Consults Goal: Diagnosis - Craniotomy Outcome: Completed/Met Date Met:  06/04/11 Subdural hematoma

## 2011-06-04 NOTE — Evaluation (Signed)
Occupational Therapy Evaluation Patient Details Name: Willie Ward MRN: [id number] DOB: Jun 12, 1940 Today's Date: 06/04/2011 Time: 1191-4782 OT Time Calculation (min): 21 min  OT Assessment / Plan / Recommendation Clinical Impression  71 yo male s/p Rt SDH with craniotomy demonstrating balance deficits and cognition changes. OT to follow acutely and recommend CIR     OT Assessment  Patient needs continued OT Services    Follow Up Recommendations  Inpatient Rehab    Equipment Recommendations  None recommended by PT;None recommended by SLP    Frequency Min 2X/week    Precautions / Restrictions Precautions Precautions: Fall Restrictions Weight Bearing Restrictions: No   Pertinent Vitals/Pain 4 out 10 pain at neck and head on Right side    ADL  Grooming: Performed;Teeth care;Minimal assistance;Wash/dry hands (encouraged to participate pt missing several teeth PTA) Where Assessed - Grooming: Standing at sink ((A) for balance while at sink level) Upper Body Dressing: Minimal assistance Where Assessed - Upper Body Dressing: Sitting, bed;Unsupported Toilet Transfer: Performed;Minimal assistance Toilet Transfer Method: Proofreader: Other (comment) (standing to void bladder) Toileting - Clothing Manipulation: Performed;Minimal assistance Where Assessed - Glass blower/designer Manipulation: Standing Toileting - Hygiene: Performed;Minimal assistance Where Assessed - Toileting Hygiene: Standing Equipment Used: Gait belt;Rolling walker Ambulation Related to ADLs: Pt ambulating Min (A) with LOB with head turns. Pt using central vision and required v/c to attend to objects on Rt and Lt side ADL Comments: Pt completed grooming task at sink level with balance deficits. Pt LOB x1 with Min (A) to correct turning head to the right ambulating to sink level. Pt brushing bottom teeth without tooth brush and required cue to apply paste with max (A) Pt washing hands without  soap use. pt claims that washing his hands with a special kind of soap from home is the reason his digits are whiter in skin tone then the remainder of UE. Pt also reports meeting [name] himself in 1966. Pt with cognition deficits noted. Pt with a sense of humor and requires questions repeated or readdressed at times during session to determine humor v/s cognition deficits.    OT Goals Acute Rehab OT Goals OT Goal Formulation: With patient Time For Goal Achievement: 06/18/11 Potential to Achieve Goals: Good ADL Goals Pt Will Perform Grooming: with set-up;Supported;Standing at sink ADL Goal: Grooming - Progress: Goal set today Pt Will Perform Upper Body Bathing: with set-up;Sit to stand from bed;Sit to stand from chair ADL Goal: Upper Body Bathing - Progress: Goal set today Pt Will Perform Lower Body Bathing: with set-up;Sit to stand from chair;Sit to stand from bed ADL Goal: Lower Body Bathing - Progress: Goal set today Pt Will Perform Upper Body Dressing: with set-up;Sit to stand from chair;Sit to stand from bed ADL Goal: Upper Body Dressing - Progress: Goal set today Pt Will Perform Lower Body Dressing: with set-up;Sit to stand from chair;Sit to stand from bed ADL Goal: Lower Body Dressing - Progress: Goal set today Pt Will Transfer to Toilet: with set-up;3-in-1 ADL Goal: Toilet Transfer - Progress: Goal set today Pt Will Perform Toileting - Clothing Manipulation: Sitting on 3-in-1 or toilet;with set-up ADL Goal: Toileting - Clothing Manipulation - Progress: Goal set today Pt Will Perform Toileting - Hygiene: with set-up;Sit to stand from 3-in-1/toilet ADL Goal: Toileting - Hygiene - Progress: Goal set today  Visit Information  Last OT Received On: 06/04/11 Assistance Needed: +1 PT/OT Co-Evaluation/Treatment: Yes    Subjective Data  Subjective: "i meant [name] himself right outside  of here in 1966 when we were putting in the lights"   Prior Functioning  Home  Living Lives With: Alone Available Help at Discharge: Friend(s);Available PRN/intermittently Type of Home: Mobile home Home Access: Stairs to enter Entrance Stairs-Number of Steps: 2 Entrance Stairs-Rails: None Home Layout: One level Bathroom Shower/Tub: Walk-in shower;Door [employer] Toilet: Standard Bathroom Accessibility: Yes How Accessible: Accessible via walker Home Adaptive Equipment: Walker - rolling;Straight cane Prior Function Level of Independence: Independent Able to Take Stairs?: Yes Driving: No Vocation: Retired Musician: No difficulties Dominant Hand: Right    Cognition  Overall Cognitive Status: Appears within functional limits for tasks assessed/performed Area of Impairment: Memory;Safety/judgement;Awareness of deficits Arousal/Alertness: Awake/alert Orientation Level: Appears intact for tasks assessed Behavior During Session: [facility] for tasks performed Safety/Judgement: Decreased safety judgement for tasks assessed    Extremity/Trunk Assessment Right Upper Extremity Assessment RUE ROM/Strength/Tone: [facility] for tasks assessed;Within functional levels RUE Coordination: WFL - gross motor Left Upper Extremity Assessment LUE ROM/Strength/Tone: Within functional levels;WFL for tasks assessed LUE Coordination: WFL - gross motor   Mobility Bed Mobility Bed Mobility: Rolling Right;Right Sidelying to Sit;Sitting - Scoot to [place] of Bed Rolling Right: 4: Min guard Right Sidelying to Sit: 4: Min guard;HOB flat Supine to Sit: 4: Min guard Sitting - Scoot to [place] of Bed: 4: Min guard Details for Bed Mobility Assistance: Pt did not need physical assistance with bed mobility but VC's to slow down to increase safety.  Min guard for safety Transfers Sit to Stand: 5: Supervision;With upper extremity assist;From bed Stand to Sit: 5: Supervision;With upper extremity assist;To chair/3-in-1 Details for Transfer Assistance: VC's for hand placement on bed for saftey     Exercise    Balance Balance Balance Assessed: Yes Dynamic Standing Balance Dynamic Standing - Balance Support: Bilateral upper extremity supported;During functional activity Dynamic Standing - Level of Assistance: 5: Stand by assistance Dynamic Standing - Comments: pt stood for ~8 mins for ADL with OT. slight unsteadiness but able to maintain balance without assistance.   End of Session OT - End of Session Equipment Utilized During Treatment: Gait belt Activity Tolerance: Patient tolerated treatment well Patient left: in chair;with call bell/phone within reach Nurse Communication: Mobility status   Lucile Shutters 06/04/2011, 1:09 PM Pager: [phone]

## 2011-06-04 NOTE — Progress Notes (Signed)
06/04/2011 Keegan Bensch Elizabeth PTA [phone] pager [phone] office    

## 2011-06-04 NOTE — Progress Notes (Signed)
Pt transferred via wheelchair to 3012.  VSS.  Report given to RN.

## 2011-06-04 NOTE — Progress Notes (Signed)
Physical Therapy Treatment Patient Details Name: Willie Ward MRN: [id number] DOB: Jun 05, 1940 Today's Date: 06/04/2011 Time: 4098-1191 PT Time Calculation (min): 21 min  PT Assessment / Plan / Recommendation Comments on Treatment Session   Pt able to increase amb distance but required assistance for stability due to unsteadiness and LOB during amb with turns/ head turns. Pt has impulsive behavior and decrease awareness of limitations.    Follow Up Recommendations  Home health PT    Equipment Recommendations  None recommended by PT;None recommended by SLP    Frequency     Plan Discharge plan remains appropriate;Frequency remains appropriate    Precautions / Restrictions Precautions Precautions: Fall Restrictions Weight Bearing Restrictions: No   Pertinent Vitals/Pain     Mobility  Bed Mobility Bed Mobility: Rolling Right;Right Sidelying to Sit;Sitting - Scoot to [place] of Bed Rolling Right: 4: Min guard Right Sidelying to Sit: 4: Min guard;HOB flat Supine to Sit: 4: Min guard Sitting - Scoot to [place] of Bed: 4: Min guard Details for Bed Mobility Assistance: Pt did not need physical assistance with bed mobility but VC's to slow down to increase safety.  Min guard for safety Transfers Transfers: Sit to Stand;Stand to Sit Sit to Stand: 5: Supervision;With upper extremity assist;From bed Stand to Sit: 5: Supervision;With upper extremity assist;To chair/3-in-1 Details for Transfer Assistance: VC's for hand placement on bed for saftey  Ambulation/Gait Ambulation/Gait Assistance: 4: Min assist Ambulation Distance (Feet): 120 Feet Assistive device: Rolling walker Ambulation/Gait Assistance Details:  Pt had unsteadiness and LOB during amb with turns/ head turns.  Gait Pattern: Step-to pattern;Decreased stride length;Decreased hip/knee flexion - left;Decreased dorsiflexion - right;Trunk flexed;Narrow base of support Stairs: No Wheelchair Mobility Wheelchair Mobility: No      Exercises     PT Goals    Visit Information  Last PT Received On: 06/04/11 Assistance Needed: +1 PT/OT Co-Evaluation/Treatment: Yes    Subjective Data  Subjective: Pt showed slight impulsive behavior    Cognition  Overall Cognitive Status: Appears within functional limits for tasks assessed/performed Area of Impairment: Memory;Safety/judgement;Awareness of deficits Arousal/Alertness: Awake/alert Orientation Level: Appears intact for tasks assessed Behavior During Session: [facility] for tasks performed Safety/Judgement: Decreased safety judgement for tasks assessed    Balance  Balance Balance Assessed: Yes Dynamic Standing Balance Dynamic Standing - Balance Support: Bilateral upper extremity supported;During functional activity Dynamic Standing - Level of Assistance: 5: Stand by assistance Dynamic Standing - Comments: pt stood for ~8 mins for ADL with OT. slight unsteadiness but able to maintain balance without assistance.   End of Session PT - End of Session Equipment Utilized During Treatment: Gait belt Activity Tolerance: Patient tolerated treatment well Patient left: in chair;with call bell/phone within reach Nurse Communication: Mobility status    Tamera Stands 06/04/2011, 1:05 PM

## 2011-06-04 NOTE — Progress Notes (Signed)
Clinical Social Work Department BRIEF PSYCHOSOCIAL ASSESSMENT 06/04/2011  Patient:  Willie Ward, Willie Ward     Account Number:  [id number]     Admit date:  05/31/2011  Clinical Social Worker:  Peggyann Shoals  Date/Time:  06/04/2011 02:30 PM  Referred by:  RN  Date Referred:  06/04/2011 Referred for  SNF Placement   Other Referral:   Interview type:  Patient Other interview type:    PSYCHOSOCIAL DATA Living Status:  ALONE Admitted from facility:   Level of care:   Primary support name:  Burton Apley Primary support relationship to patient:  FRIEND Degree of support available:   This person is power of attorney.    CURRENT CONCERNS Current Concerns  Post-Acute Placement   Other Concerns:    SOCIAL WORK ASSESSMENT / PLAN CSW met with pt to address consult. PT is recommending home health services with 24 hour supervision, if not then they recommend SNF. Pt shared that is able to have 24 hour supervision from close friends. Pt shared that wishes not to have his son Nida Boatman involved with any desicison making and discharge planning. Pt shared that his friend, Burton Apley, is his power of attorney. CSW encouraged pt to provide needed documents. Pt shared that he would like to notify his friends before he gives this CSW his friend's phone numbers as they may not answer or return phone calls because it is not a known number. CSW also addressed pt's ETOH use. Pt shared that his doctors encouraged hoim to stop drinking and did 3 weeks ago. Pt shared that when he was drinking, he would have 2 2 oz drinks or beer. Pt turned his driver's license. Pt also shared that he no longer allows ETOH in his home.  CSW discussed assessment with RN and a password was established as pt's son has been calling through out the day requesting SNF.   Assessment/plan status:  Other - See comment Other assessment/ plan:   CSW will continue to follow to ensure a safe discharge plan.   Information/referral to  community resources:   As needed    PATIENT'S/FAMILY'S RESPONSE TO PLAN OF CARE: Pt was pleasant and alert and oriented. Pt shared that he prefers to discharge home and declined SNF placement. Pt also shared that he does not want to have his son, Nida Boatman, involved in his discharge planning.   Dede Query, MSW, Theresia Majors [phone]

## 2011-06-05 DIAGNOSIS — W19XXXA Unspecified fall, initial encounter: Secondary | ICD-10-CM

## 2011-06-05 DIAGNOSIS — F1010 Alcohol abuse, uncomplicated: Secondary | ICD-10-CM

## 2011-06-05 DIAGNOSIS — S065X9A Traumatic subdural hemorrhage with loss of consciousness of unspecified duration, initial encounter: Secondary | ICD-10-CM

## 2011-06-05 NOTE — Progress Notes (Signed)
Physical Therapy Treatment Patient Details Name: Willie Ward MRN: [id number] DOB: 11-29-1940 Today's Date: 06/05/2011 Time: 4098-1191 PT Time Calculation (min): 41 min  PT Assessment / Plan / Recommendation Comments on Treatment Session  Pt required max VC's for saftey with amb. Pt unaware of decreased safety issues during amb. Pt showed decrease stabilize with DGI activities. Pt improved impulsive behavior but still has limitations. Pt was educated on Inpatient rehab. increased time due to o2 sats and PA visit during treatment.     Follow Up Recommendations  Home health PT    Equipment Recommendations  None recommended by PT;None recommended by SLP    Frequency Min 4X/week   Plan Discharge plan remains appropriate;Frequency remains appropriate    Precautions / Restrictions Precautions Precautions: Fall Restrictions Weight Bearing Restrictions: No   Pertinent Vitals/Pain No pain before or after treatment      Mobility  Bed Mobility Bed Mobility: Rolling Right;Right Sidelying to Sit;Sitting - Scoot to Edge of Bed;Sit to Supine;Sit to Sidelying Right Rolling Right: 5: Supervision;With rail Right Sidelying to Sit: 5: Supervision;With rails;HOB flat Sitting - Scoot to Edge of Bed: 5: Supervision;With rail Sit to Sidelying Right: 5: Supervision;With rail;HOB flat Scooting to [facility]: With rail;6: Modified independent (Device/Increase time) Details for Bed Mobility Assistance: Pt able to complete taks without any assistance or VC's.  Transfers Transfers: Sit to Stand;Stand to Sit Sit to Stand: From bed;With upper extremity assist;4: Min guard Stand to Sit: 4: Min guard;To bed Details for Transfer Assistance: Pt slight unsteady during descent but able to maintain balance without assistance. Min guard A for safety  Ambulation/Gait Ambulation/Gait Assistance: 4: Min assist Ambulation Distance (Feet): 200 Feet Assistive device: Rolling walker Ambulation/Gait Assistance Details: Pt  needed max VC's and assistance with RW placement and upright posture. Pt showed decreased awareness and decreased with DGI activities. Pt unsteady with turns to both sides.     Gait Pattern: Step-through pattern;Decreased stride length;Trunk flexed;Decreased hip/knee flexion - left;Decreased hip/knee flexion - right Stairs: No Wheelchair Mobility Wheelchair Mobility: No    Exercises     PT Goals Acute Rehab PT Goals PT Goal: Supine/Side to Sit - Progress: Progressing toward goal PT Goal: Sit to Supine/Side - Progress: Progressing toward goal PT Goal: Sit to Stand - Progress: Progressing toward goal PT Goal: Stand to Sit - Progress: Progressing toward goal PT Goal: Ambulate - Progress: Progressing toward goal  Visit Information  Last PT Received On: 06/05/11 Assistance Needed: +1    Subjective Data      Cognition  Overall Cognitive Status: Appears within functional limits for tasks assessed/performed Area of Impairment: Attention Arousal/Alertness: Awake/alert Orientation Level: Appears intact for tasks assessed Behavior During Session: [facility] for tasks performed Safety/Judgement: Decreased safety judgement for tasks assessed    Balance     End of Session PT - End of Session Equipment Utilized During Treatment: Gait belt Activity Tolerance: Patient tolerated treatment well Patient left: in bed;with call bell/phone within reach;with family/visitor present    Tamera Stands 06/05/2011, 12:44 PM

## 2011-06-05 NOTE — Progress Notes (Signed)
I met with patient at bedside. He does not have 24/7 supervision at home. Has friends who stop in daily. He does not want me to contact any of his four children. He feels he can go home as he is currently with home health. I will discuss with Dr. Riley Kill  And follow up tomorrow. Please call [phone] with questions.

## 2011-06-05 NOTE — Consult Note (Signed)
Physical Medicine and Rehabilitation Consult Reason for Consult: SDH, falls, alcohol abuse Referring Physician:  Dr. Yetta Barre   HPI:  Willie Ward is an 71 y.o. alcoholic male who states he drinks about 10 beers per day who's had multiple falls. He fell multiple times even today. He states he just loses his balance and falls. States he really hasn't struck his head and has had no loss of consciousness. Has a history of seizures. Has a history of a C2 fracture in 2011. Presented to the ER on 4/25 with complaints of headache and falls. Headache is progressively worse. No nausea and vomiting. No new numbness tingling or weakness. Head CT revealed acute large right frontal SDH with midline shift and patient underwent right crani for evacuation of hematoma on the same day by Dr. Yetta Barre.  Post op with resolution of headaches. Patient with borderline memory deficits with poor safety and no further ST needs per evaluation 4/26. PT/OT evaluations done and patient noted to have impulsive behavior with poor judgement.  24 hour supervision recommended and patient refusing SNF due to concerns about finances.  MD, OT recommending CIR.  Review of Systems  Eyes: Negative for blurred vision and double vision.  Respiratory: Positive for shortness of breath. Negative for cough.   Cardiovascular: Negative for chest pain and palpitations.  Gastrointestinal: Negative for heartburn.  Musculoskeletal: Positive for falls (multiple falls past 8 weeks). Negative for myalgias.  Neurological: Negative for headaches.  All other systems reviewed and are negative.   Past Medical History  Diagnosis Date  . Chronic back pain   . Prostate cancer   . Depression   . COPD (chronic obstructive pulmonary disease)   . Shortness of breath   . Hypertension   . Stroke   . Seizures    Past Surgical History  Procedure Date  . Cataract extraction, bilateral 02/2011    Dr. Lavona Mound.   . Craniotomy 05/31/2011    Procedure: CRANIOTOMY  HEMATOMA EVACUATION SUBDURAL;  Surgeon: Tia Alert, MD;  Location: [facility];  Service: Neurosurgery;  Laterality: Right;  Right Frontal Craniotomy for Evacuation of Subdural Hematoma   Family History  Problem Relation Age of Onset  . Heart attack Mother   . Depression Son   . Diabetes Sister   . Hypertension Mother   . Hypertension Brother    Social History: Lives alone.  Independent PTA. Has friends who assist with driving, household chores and who can check in past discharge.  He  reports that he has been smoking Cigarettes.  He has been smoking about 1.5 packs per day. He does not have any smokeless tobacco history on file. He reports that he drinks about 14.4 ounces of alcohol per week. He reports that he does not use illicit drugs.   Allergies  Allergen Reactions  . Tape Other (See Comments)    Peels skin   Medications Prior to Admission  Medication Sig Dispense Refill  . albuterol (PROVENTIL,VENTOLIN) 90 MCG/ACT inhaler Inhale 2 puffs into the lungs every 6 (six) hours as needed for wheezing.  17 g  1  . ALPRAZolam (XANAX) 1 MG tablet TAKE 1 TABLET BY MOUTH TWICE DAILY AS NEEDED FOR ANXIETY  50 tablet  0  . amitriptyline (ELAVIL) 50 MG tablet TAKE 1 TABLET BY MOUTH AT BEDTIME  90 tablet  0  . aspirin 81 MG tablet Take 81 mg by mouth daily.        Marland Kitchen ENSURE (ENSURE) Take 237 mLs by mouth 3 (  three) times daily between meals.  237 mL  12  . FLUoxetine (PROZAC) 40 MG capsule TAKE ONE CAPSULE BY MOUTH DAILY  30 capsule  3  . lisinopril (PRINIVIL,ZESTRIL) 40 MG tablet Take 1 tablet (40 mg total) by mouth daily.  30 tablet  6  . metoprolol (TOPROL-XL) 50 MG 24 hr tablet Take 1 tablet (50 mg total) by mouth daily.  30 tablet  4  . tiotropium (SPIRIVA HANDIHALER) 18 MCG inhalation capsule Place 1 capsule (18 mcg total) into inhaler and inhale daily.  30 capsule  2  . DISCONTD: levETIRAcetam (KEPPRA XR) 500 MG 24 hr tablet Take 1 tablet (500 mg total) by mouth daily.  30 tablet  6  .  DISCONTD: traMADol (ULTRAM) 50 MG tablet Take 50 mg by mouth every 8 (eight) hours as needed.         Home: Home Living Lives With: Alone Available Help at Discharge: Friend(s);Available PRN/intermittently Type of Home: Mobile home Home Access: Stairs to enter Entrance Stairs-Number of Steps: 2 Entrance Stairs-Rails: None Home Layout: One level Bathroom Shower/Tub: Walk-in shower;Door [employer] Toilet: Standard Bathroom Accessibility: Yes How Accessible: Accessible via walker Home Adaptive Equipment: Walker - rolling;Straight cane  Functional History: Prior Function Able to Take Stairs?: Yes Driving: No Vocation: Retired Functional Status:  Mobility: Bed Mobility Bed Mobility: Rolling Right;Right Sidelying to Sit;Sitting - Scoot to Edge of Bed Rolling Right: 4: Min guard Right Sidelying to Sit: 4: Min guard;HOB flat Supine to Sit: 4: Min guard Sitting - Scoot to [place] of Bed: 4: Min guard Sit to Supine: 5: Supervision Transfers Transfers: Sit to Stand;Stand to Sit Sit to Stand: 5: Supervision;With upper extremity assist;From bed Stand to Sit: 5: Supervision;With upper extremity assist;To chair/3-in-1 Ambulation/Gait Ambulation/Gait Assistance: 4: Min assist Ambulation Distance (Feet): 120 Feet Assistive device: Rolling walker Ambulation/Gait Assistance Details:  Pt able to increase amb distance but required assistance for stability due to unsteadiness and LOB during amb with turns/ head turns. Pt has impulsive behavior and decrease awareness of limitations. Gait Pattern: Step-to pattern;Decreased stride length;Decreased hip/knee flexion - left;Decreased dorsiflexion - right;Trunk flexed;Narrow base of support Gait velocity: decreased gait speed Stairs: No Wheelchair Mobility Wheelchair Mobility: No  ADL: ADL Grooming: Performed;Teeth care;Minimal assistance;Wash/dry hands (encouraged to participate pt missing several teeth PTA) Where Assessed - Grooming: Standing at  sink ((A) for balance while at sink level) Upper Body Dressing: Minimal assistance Where Assessed - Upper Body Dressing: Sitting, bed;Unsupported Toilet Transfer: Performed;Minimal assistance Toilet Transfer Method: Proofreader: Other (comment) (standing to void bladder) Toileting - Clothing Manipulation: Performed;Minimal assistance Where Assessed - Glass blower/designer Manipulation: Standing Toileting - Hygiene: Performed;Minimal assistance Where Assessed - Toileting Hygiene: Standing Equipment Used: Gait belt;Rolling walker Ambulation Related to ADLs: Pt ambulating Min (A) with LOB with head turns. Pt using central vision and required v/c to attend to objects on Rt and Lt side ADL Comments: Pt completed grooming task at sink level with balance deficits. Pt LOB x1 with Min (A) to correct turning head to the right ambulating to sink level. Pt brushing bottom teeth without tooth brush and required cue to apply paste with max (A) Pt washing hands without soap use. pt claims that washing his hands with a special kind of soap from home is the reason his digits are whiter in skin tone then the remainder of UE. Pt also reports meeting [name] himself in 1966. Pt with cognition deficits noted. Pt with a sense of humor and requires questions  repeated or readdressed at times during session to determine humor v/s cognition deficits.  Cognition: Cognition Overall Cognitive Status: Impaired at baseline Arousal/Alertness: Awake/alert Orientation Level: Oriented X4 Attention: Selective Selective Attention: Appears intact Memory: Impaired Memory Impairment: Decreased recall of new information (Complex information) Awareness: Appears intact Problem Solving: Appears intact Safety/Judgment: Appears intact Cognition Overall Cognitive Status: Appears within functional limits for tasks assessed/performed Area of Impairment: Memory;Safety/judgement;Awareness of  deficits Arousal/Alertness: Awake/alert Orientation Level: Appears intact for tasks assessed Behavior During Session: [facility] for tasks performed Safety/Judgement: Decreased safety judgement for tasks assessed  Blood pressure 107/68, pulse 76, temperature 98.1 F (36.7 C), temperature source Oral, resp. rate 20, height 5\' 11"  (1.803 m), weight 67.4 kg (148 lb 9.4 oz), SpO2 90.00%. Physical Exam  Nursing note and vitals reviewed. Constitutional: He is oriented to person, place, and time. He appears well-developed.  HENT:  Head: Normocephalic.       Right crani incision with staples in place  Eyes: Pupils are equal, round, and reactive to light.  Neck: Normal range of motion. Neck supple.  Cardiovascular: Normal rate and regular rhythm.   Pulmonary/Chest: Effort normal and breath sounds normal.  Abdominal: Soft. Bowel sounds are normal.  Musculoskeletal: He exhibits no edema.  Neurological: He is alert and oriented to person, place, and time.       Speech clear.  Follows commands without difficulty.  Moves all fours fairly equally. He has stocking glove sensory loss to pinprick and light touch in the lower greater than upper extremities. No gross limb ataxia is seen. Cognitively he is fair insight and awareness. He may have a mild left central 7 sign but speech is intelligible.  Skin: Skin is warm and dry.       Multiple scars from healed skin tears BUE. Healing abrasions left knee and right shin. Right craniotomy incision is intact.  Psychiatric: He has a normal mood and affect. His behavior is normal.    No results found for this or any previous visit (from the past 24 hour(s)). Ct Head Wo Contrast  06/04/2011  *RADIOLOGY REPORT*  Clinical Data: Followup craniotomy for intracranial hemorrhage.  CT HEAD WITHOUT CONTRAST  Technique:  Contiguous axial images were obtained from the base of the skull through the vertex without contrast.  Comparison: 06/01/2011.  Findings: Post right frontal  craniotomy for drainage of subdural collection with drain remaining in place.  The amount of pneumocephalus has decreased slightly.  Decrease in size of the hypodense subdural collection posterior right frontal - temporal and parietal lobe.  There remains extra- axial blood adjacent to the craniotomy with mild mass effect upon the right frontal horn.  Midline shift to the left by 2.8 mm.  Small vessel disease type changes.  No CT evidence of large acute thrombotic infarct.  No intracranial mass lesion detected on this unenhanced exam.  Vascular calcifications.  IMPRESSION: Decrease in size of right sided extra-axial collection as detailed above.  Original Report Authenticated By: Fuller Canada, M.D.    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 Does the need for close, 24 hr/day medical supervision in concert with the patient's rehab needs make it unreasonable for this patient to be served in a less intensive setting? Yes 2. Co-Morbidities requiring supervision/potential complications: Hypertension, history of adenocarcinoma of the prostate 3. Due to bladder management, bowel management, safety, skin/wound care, disease management, medication administration, pain management and patient education, does the patient require  24 hr/day rehab nursing? Yes 4. Does the patient require coordinated care of a physician, rehab nurse, PT (1-2 hrs/day, 5 days/week), OT (1-2 hrs/day, 5 days/week) and SLP (1-2 hrs/day, 5 days/week) to address physical and functional deficits in the context of the above medical diagnosis(es)? Yes and Potentially Addressing deficits in the following areas: balance, endurance, locomotion, strength, transferring, bowel/bladder control, bathing, dressing, feeding, grooming, toileting and psychosocial support 5. Can the patient actively participate in an intensive therapy program of at least 3 hrs  of therapy per day at least 5 days per week? Yes 6. The potential for patient to make measurable gains while on inpatient rehab is good 7. Anticipated functional outcomes upon discharge from inpatient rehab are supervision with PT, supervision with OT, supervision to modified independent with SLP. 8. Estimated rehab length of stay to reach the above functional goals is: 7-10 days 9. Does the patient have adequate social supports to accommodate these discharge functional goals? Potentially 10. Anticipated D/C setting: Home 11. Anticipated post D/C treatments: HH therapy 12. Overall Rehab/Functional Prognosis: good  RECOMMENDATIONS: This patient's condition is appropriate for continued rehabilitative care in the following setting: CIR Patient has agreed to participate in recommended program. Yes Note that insurance prior authorization may be required for reimbursement for recommended care.  Comment: Patient is very motivated to participate in the inpatient rehabilitation program. He will need supervision at discharge. Need to confirm social supports.   Ranelle Oyster M.D. 06/05/2011

## 2011-06-05 NOTE — Progress Notes (Signed)
Patient ID: Willie Ward, male   DOB: 08/11/1940, 71 y.o.   MRN: [id number] When I walked in the room the patient asked 1 her golf game was scheduled for. I told him we didn't have a golf him scheduled and he said, "all I thought U. sed we have scheduled a golf match today." I don't believe he was kidding. He is oriented to person and place but did not recall that he had an operation. I asked him why he was in the hospital he said "because I was misbehaving and doing things that should have been doing." When I asked specifically what those things were he stated it was drinking alcohol was caused him to fall too much, and he stated that if he didn't quit it would soon end his life. This did show some insight. He denies headache today.  On exam he is awake and alert and conversant with no aphasia. Incision is clean dry and intact. No pronator drift. Negative Hoffman sign. According to the nurse he was unsteady on his feet while ambulating with therapy yesterday.  Clinical social work med with him yesterday. He is refusing SNF placement, loss to go home with 24-hour supervision. He told her that he no longer allows alcohol in the house and quit drinking 3 weeks ago. I do not believe this as he had alcohol on his system when he was admitted to the hospital last week, and admitted to drinking at least 8 beers per day. When he got to the floor yesterday he mentioned that he was told that he could have with her beer in the hospital, which was indeed mentioned in front of him when we were discussing prevention of delirium tremens. So I do not believe that he has quit drinking, and I believe he went home he would drinking heavily again and we'll continue to fall and have complications.  He is art he suffered a C2 fracture and a subdural hematoma on separate occasions from falls secondary to drinking. He is not safe to be discharged home 24-hour supervision, especially given the fact that this will be given by  "friends." I am not sure that these friends can be absolutely trusted to prevent him from injury and to keep him safe. At this point he lives alone and has not arrange 24-hour supervision. I'm not sure that he is competent to arrange this supervision. Initially given some of his disorientation. I would prefer that he go to a skilled nursing facility or to inpatient rehabilitation. I would think he would be a perfect candidate for inpatient rehabilitation, and I will push for this.

## 2011-06-05 NOTE — Progress Notes (Signed)
06/05/2011 Damein Gaunce Elizabeth PTA [phone] pager [phone] office    

## 2011-06-06 ENCOUNTER — Inpatient Hospital Stay (HOSPITAL_COMMUNITY): Payer: Medicare Other

## 2011-06-06 MED ORDER — HYDROCODONE-ACETAMINOPHEN 5-325 MG PO TABS
1.0000 | ORAL_TABLET | Freq: Four times a day (QID) | ORAL | Status: DC | PRN
  Administered 2011-06-06 (×2): 2 via ORAL
  Filled 2011-06-06 (×2): qty 2

## 2011-06-06 MED ORDER — OXYCODONE-ACETAMINOPHEN 5-325 MG PO TABS
1.0000 | ORAL_TABLET | ORAL | Status: DC | PRN
  Administered 2011-06-06 – 2011-06-11 (×25): 2 via ORAL
  Administered 2011-06-11: 1 via ORAL
  Administered 2011-06-12 (×5): 2 via ORAL
  Administered 2011-06-13 (×2): 1 via ORAL
  Filled 2011-06-06 (×11): qty 2
  Filled 2011-06-06 (×2): qty 1
  Filled 2011-06-06 (×21): qty 2

## 2011-06-06 NOTE — Progress Notes (Signed)
Pt assisted from bed to bathroom with one assist and walker.  Pt very unsteady on feet and states his memory is not very good anymore.  Pt remains a high fall risk and is advised not to get up without calling for assistance.  Pt assisted back to bed and bed alarm on.  Bed in lowest position.

## 2011-06-06 NOTE — Progress Notes (Signed)
Occupational Therapy Treatment Patient Details Name: Willie Ward MRN: [id number] DOB: 09/05/1940 Today's Date: 06/06/2011 Time: 8295-6213 OT Time Calculation (min): 27 min  OT Assessment / Plan / Recommendation Comments on Treatment Session Pt demonstrates increased insight to deficits with balance and could benefit from CIR due to balance deficits    Follow Up Recommendations  Inpatient Rehab    Equipment Recommendations  Defer to next venue    Frequency Min 2X/week   Plan Discharge plan remains appropriate    Precautions / Restrictions Precautions Precautions: Fall   Pertinent Vitals/Pain Constant coughing    ADL  Eating/Feeding: Performed;Set up Where Assessed - Eating/Feeding: Edge of bed Upper Body Dressing: Performed;Minimal assistance Where Assessed - Upper Body Dressing: Sitting, chair;Supported Equipment Used: Gait belt ADL Comments: Pt provided multiple cognition questions: Pt oriented x4, named doctor, recalled reason for admission, completed simple math with money questioning, answered two fire emergency questions all 100% correct. After safety questions pt demonstrated insight and states "i do not feel comfortable answering any more medical questions." Pt provided questioning cues and pt then with encouragement states " i do not want to be commited for answering medical questions my brain is fine and working" Pt recognized that deficit currently "balance is not as good now" Pt completed components of Berg: pt able to static stand with Min guard (A) 2 minutes with swaying forward and posteriorly, pick cup up off floor with min (A),  able to stand with 10 seconds eyes closed swaying forward and posteriorly, pt unable to complete tatem standing, single leg , narrowed base of support with Mod (A) to correct LOB. Pt coughing and limiting Berg completion. Pt then setup for lunch to continue session. pt sitting EOB with fair sitting balance. RN arriving to address cough at end  of session. Pt with large balance deficits that affect all adls. Pt     OT Goals Acute Rehab OT Goals OT Goal Formulation: With patient Time For Goal Achievement: 06/18/11 Potential to Achieve Goals: Good ADL Goals Pt Will Perform Grooming: with set-up;Supported;Standing at sink ADL Goal: Grooming - Progress: Progressing toward goals Pt Will Perform Upper Body Bathing: with set-up;Sit to stand from bed;Sit to stand from chair Pt Will Perform Lower Body Bathing: with set-up;Sit to stand from chair;Sit to stand from bed Pt Will Perform Upper Body Dressing: with set-up;Sit to stand from chair;Sit to stand from bed ADL Goal: Upper Body Dressing - Progress: Progressing toward goals Pt Will Perform Lower Body Dressing: with set-up;Sit to stand from chair;Sit to stand from bed Pt Will Transfer to Toilet: with set-up;3-in-1 Pt Will Perform Toileting - Clothing Manipulation: Sitting on 3-in-1 or toilet;with set-up Pt Will Perform Toileting - Hygiene: with set-up;Sit to stand from 3-in-1/toilet  Visit Information  Last OT Received On: 06/06/11 Assistance Needed: +1    Subjective Data      Prior Functioning       Cognition  Overall Cognitive Status: Impaired Arousal/Alertness: Awake/alert Orientation Level: Appears intact for tasks assessed Behavior During Session: [facility] for tasks performed Current Attention Level: Sustained Following Commands: Follows one step commands consistently    Mobility Bed Mobility Bed Mobility: Supine to Sit Rolling Right: 5: Supervision;With rail Right Sidelying to Sit: 5: Supervision;With rails;HOB flat Supine to Sit: 5: Supervision;With rails Transfers Transfers: Sit to Stand;Stand to Sit Sit to Stand: 4: Min guard;From bed;With upper extremity assist Stand to Sit: 4: Min guard;To bed;With upper extremity assist   Exercises    Balance Balance Balance Assessed: Yes  Dynamic Standing Balance Dynamic Standing - Balance Support: Bilateral upper extremity  supported;During functional activity Dynamic Standing - Level of Assistance: 5: Stand by assistance Standardized Balance Assessment Standardized Balance Assessment: Berg Balance Test Berg Balance Test Sit to Stand: Able to stand  independently using hands Standing Unsupported: Able to stand 2 minutes with supervision Sitting with Back Unsupported but Feet Supported on Floor or Stool: Able to sit 2 minutes under supervision Stand to Sit: Controls descent by using hands Transfers: Needs one person to assist Standing Unsupported with Eyes Closed: Able to stand 10 seconds with supervision Standing Ubsupported with Feet Together: Able to place feet together independently but unable to hold for 30 seconds From Standing Position, Pick up Object from Floor: Unable to try/needs assist to keep balance Standing Unsupported, One Foot in Front: Loses balance while stepping or standing Standing on One Leg: Unable to try or needs assist to prevent fall  End of Session OT - End of Session Equipment Utilized During Treatment: Gait belt Activity Tolerance: Treatment limited secondary to medical complications (Comment) (constant coughing, awaiting chest xray results during sessio) Patient left: in bed;with call bell/phone within reach;with nursing in room Nurse Communication: Mobility status   Lucile Shutters 06/06/2011, 3:30 PM Pager: [phone]

## 2011-06-06 NOTE — Progress Notes (Signed)
Agree with change in disposition 06/06/2011 Milana Kidney DPT PAGER: [phone] OFFICE: [phone]

## 2011-06-06 NOTE — Progress Notes (Signed)
Utilization review completed. Kenyatte Gruber, RN, BSN.    06/06/11  

## 2011-06-06 NOTE — Progress Notes (Signed)
Patient ID: Willie Ward, male   DOB: 08-Sep-1940, 70 y.o.   MRN: [id number] Patient remains awake and alert and fairly nonfocal but with times of disorientation. He still refuses skilled nursing facility placement and unfortunately at this point he has no 24-hour assistance at home so that is difficult to discharge him home or send him to rehabilitation. He is unsafe for discharge home and is a significant fall risk. I've explained him that I cannot discharge him home because of that risk of injury to himself. He refuses to allow [place] to get family involved. I explained to him that if he can find a friend it will take care of him and be responsible for him 24 hours a day upon discharge or few allow [place] to get his family involved that he would be a go to rehabilitation, and that is his quickest way to discharge home.

## 2011-06-06 NOTE — Progress Notes (Signed)
Physical Therapy Treatment Patient Details Name: Willie Ward MRN: [id number] DOB: [birth year] Today's Date: 06/06/2011 Time: 2841-3244 PT Time Calculation (min): 29 min  PT Assessment / Plan / Recommendation Comments on Treatment Session  Pt at increased risk for falls. Patient stated he planned to DC home however patient stated that he does not want family invovled in his care and that he has friends that can care for him. Patient has not had friends come up to visit and I am concerned about the reliablility of his friends. Dr. Yetta Barre came in at the end of the PT session and spoke with patient in detail reguarding DC plans. Dr. Yetta Barre informed patient that he wil not be going home however patient refusing SNF. Patient agreeable to CIR and stated that he can have a friend stay with him after rehab. Dr. Yetta Barre informed patient that the caregiver would need to be contacted to insure his reliability. Will recommend CIR consult as Dr. Yetta Barre will not allow patient to DC home alone at this time and rehab is the best option if patient can arrange support at home. Will notify CIR admissions coordinator.     Follow Up Recommendations  Inpatient Rehab;Skilled nursing facility;Supervision/Assistance - 24 hour (If unable to have A for CIR. Recommend SNF however pt.refuse)    Equipment Recommendations  Defer to next venue    Frequency Min 4X/week   Plan Discharge plan needs to be updated    Precautions / Restrictions Precautions Precautions: Fall Restrictions Weight Bearing Restrictions: No   Pertinent Vitals/Pain 8/10 back pain.     Mobility  Bed Mobility Supine to Sit: 6: Modified independent (Device/Increase time) Sit to Supine: 6: Modified independent (Device/Increase time)  Transfers Sit to Stand: 4: Min guard;From bed;With upper extremity assist Stand to Sit: 4: Min guard;To bed;With upper extremity assist Details for Transfer Assistance: Cue for safe technique and  sequencing  Ambulation/Gait Ambulation/Gait Assistance: 4: Min assist Ambulation Distance (Feet): 250 Feet Assistive device: Rolling walker Ambulation/Gait Assistance Details: Pt required A and cues for safe use of RW. Pt with several attempts to pick up RW to go over obstacles verses positioning RW around obstacles. Pt with difficult time keepoing midline with ambulation when directed, swaying to R side. Pt with poor positioning and safety with RW.  Gait Pattern: Step-through pattern;Decreased stride length;Trunk flexed;Narrow base of support Gait velocity: decreased but not formally measured General Gait Details: Pt unsafe without outside assitance  Stairs: No Wheelchair Mobility Wheelchair Mobility: No    Exercises     PT Goals Acute Rehab PT Goals PT Goal: Supine/Side to Sit - Progress: Met PT Goal: Sit to Supine/Side - Progress: Met PT Goal: Sit to Stand - Progress: Progressing toward goal PT Goal: Stand to Sit - Progress: Progressing toward goal PT Transfer Goal: Bed to Chair/Chair to Bed - Progress: Progressing toward goal PT Goal: Ambulate - Progress: Progressing toward goal  Visit Information  Last PT Received On: 06/06/11    Subjective Data  Subjective: " I don't normally do that", When told to think about the safest way to [facility] around obstacles.    Cognition  Overall Cognitive Status: Impaired Area of Impairment: Attention;Following commands;Safety/judgement;Problem solving;Awareness of deficits;Awareness of errors Arousal/Alertness: Awake/alert Orientation Level: Appears intact for tasks assessed Behavior During Session: [facility] for tasks performed Current Attention Level: Sustained Attention - Other Comments: Patient easily distracted away from functional task to carry on multiple conversations.  Following Commands: Follows multi-step commands inconsistently Safety/Judgement: Decreased awareness of safety precautions;Decreased safety  judgement for tasks  assessed;Decreased awareness of need for assistance    Balance     End of Session PT - End of Session Equipment Utilized During Treatment: Gait belt Activity Tolerance: Patient tolerated treatment well Patient left: in bed;with call bell/phone within reach Nurse Communication: Mobility status    Fredrich Birks 06/06/2011, 10:03 AM 06/06/2011 Fredrich Birks PTA [phone] pager [phone] office

## 2011-06-07 NOTE — Progress Notes (Signed)
06/07/2011 Kirstine Jacquin Elizabeth PTA [phone] pager [phone] office    

## 2011-06-07 NOTE — Progress Notes (Addendum)
Extensive discussion with patient to express our concerns that patient not go home alone when he needs 24/7 min physical assistance due to his balance issues.   Patient expressed his concerns with cost of SNF rehab as well as hospital bill. I discussed payment plan with the hospital which he is currently doing with two accounts. I also discussed the max out of pocket expense that he could be billed per year which is typically $3400 per year for his policy. SNF rehab is also partially covered by in insurance and that I recommend short term SNF rehab until he could independently return home.  Patient questioned types of rehab of substance abuse rehab and commitment papers to such vs rehab for physical needs. I clarified that I was recommending SNF rehab for his functional/ physical needs due to his balance issues.  I recommend SNF rehab vs home with home health and 24/7 min assist if that can be arranged at this time. Patient continues to say that he needs to verify help with a friend. I am signing off.  Please call [phone] with questions. Insurance does recommend SNF rehab also.  I have contacted SW of recommendations.

## 2011-06-07 NOTE — Progress Notes (Signed)
Patient ID: Willie Ward, male   DOB: [birth year], 71 y.o.   MRN: [id number] Patient is awake and alert and states his headache is much better. His neurologic exam appears to be nonfocal. His incision remained clean dry and intact. I think he is agreed to consider skilled nursing facility placement now as I think he finally understands this is a temporary measure.

## 2011-06-07 NOTE — Progress Notes (Signed)
Chaplain Note:  Chaplain visited with pt, who was resting in bed, awake and alert.  Pt was eager to carry on conversation and told many life stories.  Pt is moving to a snf for rehab but expressed a positive attitude for recovery.  Personal relationships will be important to him when he gets home.  His conversation indicated that he is not especially close to family.  Pt spoke of the suicide of one son last December.  Chaplain provided spiritual comfort, support, and prayer for pt.  Pt expressed appreciation for chaplain support.  Chaplain will follow up if needed.   06/07/11 1600  Clinical Encounter Type  Visited With Patient  Visit Type Spiritual support  Referral From Patient  Spiritual Encounters  Spiritual Needs Emotional;Prayer  Stress Factors  Patient Stress Factors Health changes  Family Stress Factors Not reviewed (No family present)   Verdie Shire, chaplain resident [phone]

## 2011-06-07 NOTE — Progress Notes (Signed)
CSW met with pt to address SNF placement. Pt is now agreeable to SNF. CSW explained SNF process and pt would like to go to SNF in [facility]. CSW will initiate SNF search and follow up with bed offers. CSW will continue to follow to facilitate discharge to SNF.   Dede Query, MSW, Theresia Majors [phone]

## 2011-06-07 NOTE — Progress Notes (Signed)
Physical Therapy Treatment Patient Details Name: Willie Ward MRN: [id number] DOB: 06/17/1940 Today's Date: 06/07/2011 Time: 4098-1191 PT Time Calculation (min): 16 min  PT Assessment / Plan / Recommendation Comments on Treatment Session  Pt still has increase fall risk with gait due to awareness of safety and lack of spatial awareness.     Follow Up Recommendations  Inpatient Rehab;Supervision/Assistance - 24 hour;Skilled nursing facility (SNF due to no insurance coverage of CIR)    Equipment Recommendations  Defer to next venue    Frequency Min 4X/week   Plan Discharge plan remains appropriate;Frequency remains appropriate    Precautions / Restrictions Precautions Precautions: Fall Restrictions Weight Bearing Restrictions: No   Pertinent Vitals/Pain     Mobility  Bed Mobility Bed Mobility: Rolling Left;Left Sidelying to Sit;Sit to Supine Rolling Left: 6: Modified independent (Device/Increase time) Left Sidelying to Sit: 6: Modified independent (Device/Increase time) Supine to Sit: 6: Modified independent (Device/Increase time) Sitting - Scoot to Edge of Bed: 6: Modified independent (Device/Increase time) Transfers Transfers: Sit to Stand;Stand to Sit Sit to Stand: 5: Supervision;With upper extremity assist;From bed Stand to Sit: 5: Supervision;With upper extremity assist;To bed Details for Transfer Assistance: VC's for hand placement during transfers Ambulation/Gait Ambulation/Gait Assistance: 4: Min guard Ambulation Distance (Feet): 150 Feet Assistive device: Rolling walker Ambulation/Gait Assistance Details: Pt was unsteady  throughout DGI activities. VC's for upright posture Gait Pattern: Step-through pattern;Decreased stride length;Trunk flexed Gait velocity: decreased with DGI activities  Stairs: Yes Stairs Assistance: 4: Min guard Stair Management Technique: Two rails;Alternating pattern Number of Stairs: 10  (5x2 in 3rd floor gym ) Wheelchair  Mobility Wheelchair Mobility: No    Exercises     PT Goals Acute Rehab PT Goals PT Goal: Sit to Supine/Side - Progress: Met PT Goal: Sit to Stand - Progress: Progressing toward goal PT Goal: Stand to Sit - Progress: Progressing toward goal PT Transfer Goal: Bed to Chair/Chair to Bed - Progress: Discontinued (comment) PT Goal: Ambulate - Progress: Progressing toward goal  Visit Information  Last PT Received On: 06/07/11 Assistance Needed: +1    Subjective Data  Subjective: Pt still displaying unsafe behavior with gait    Cognition  Overall Cognitive Status: Impaired Area of Impairment: Attention;Following commands;Safety/judgement;Problem solving;Awareness of deficits;Awareness of errors Arousal/Alertness: Awake/alert Orientation Level: Appears intact for tasks assessed Behavior During Session: [facility] for tasks performed Safety/Judgement: Decreased awareness of safety precautions;Decreased safety judgement for tasks assessed;Decreased awareness of need for assistance    Balance  Standardized Balance Assessment Standardized Balance Assessment: Dynamic Gait Index Dynamic Gait Index Level Surface: Mild Impairment Change in Gait Speed: Mild Impairment Gait with Horizontal Head Turns: Moderate Impairment Gait with Vertical Head Turns: Moderate Impairment Gait and Pivot Turn: Moderate Impairment Step Over Obstacle: Mild Impairment Step Around Obstacles: Moderate Impairment Steps: Mild Impairment Total Score: 12   End of Session PT - End of Session Equipment Utilized During Treatment: Gait belt Activity Tolerance: Patient tolerated treatment well Patient left: in bed;with call bell/phone within reach;with bed alarm set Nurse Communication: Mobility status    Tamera Stands 06/07/2011, 11:05 AM

## 2011-06-08 NOTE — Clinical Social Work Note (Signed)
Clinical Social Worker continuing to follow patient for discharge needs.  CSW met with patient at bedside to discuss bed offers.  Patient states that his glasses are at home and he is having a friend bring them to the hospital tomorrow.  CSW offered to read list for patient, however patient was adamant about reading it himself and making his own decisions.  CSW recommended patient choose top 5 choices on list to pursue - patient agreeable.  Patient is upset about having to pick a facility without first seeing it.  CSW informed patient that he could have a friend go visit and be "his eyes" for him in hopes of steering him in the right direction.  Patient was somewhat agreeable with that plan, but concerned that someone could not look into facilities until Monday.  CSW prepared patient for a Monday discharge.  Clinical Social Worker will continue to follow patient and follow up for facility choice.  CSW to facilitate patient discharge needs once medically ready.  [address] [place], [place] [phone]

## 2011-06-08 NOTE — Progress Notes (Signed)
Patient Willie Ward, 71 year old white male is recovering from recent surgery.  Patient looks forward to living in rehab facility briefly before returning home.  Patient's wife died a few years; and one son took his own life January 07, 2011, two days after patient's birthday.  Patient appreciates living in [place], having grown in the [facility] area.  He feel encouraged from his [employer]	[place] and his 3 children.  Patient expressed appreciation for Chaplain's provision of pastoral presence, prayer, and conversation.  I will follow-up as needed.

## 2011-06-08 NOTE — Clinical Social Work Placement (Signed)
Clinical Social Work Department CLINICAL SOCIAL WORK PLACEMENT NOTE 06/08/2011  Patient:  MATTHEW, CINA  Account Number:  [id number] Admit date:  05/31/2011  Clinical Social Worker:  Macario Golds, Theresia Majors  Date/time:  06/08/2011 09:53 PM  Clinical Social Work is seeking post-discharge placement for this patient at the following level of care:   SKILLED NURSING   (*CSW will update this form in Epic as items are completed)   06/07/2011  Patient/family provided with [facility] Department of Clinical Social Work's list of facilities offering this level of care within the geographic area requested by the patient (or if unable, by the patient's family).  06/07/2011  Patient/family informed of their freedom to choose among providers that offer the needed level of care, that participate in Medicare, Medicaid or managed care program needed by the patient, have an available bed and are willing to accept the patient.  06/07/2011  Patient/family informed of [facility]' ownership interest in [facility], as well as of the fact that they are under no obligation to receive care at this facility.  PASARR submitted to EDS on 06/08/2011 PASARR number received from EDS on   FL2 transmitted to all facilities in geographic area requested by pt/family on  06/07/2011 FL2 transmitted to all facilities within larger geographic area on   Patient informed that his/her managed care company has contracts with or will negotiate with  certain facilities, including the following:     Patient/family informed of bed offers received:  06/08/2011 Patient chooses bed at  Physician recommends and patient chooses bed at    Patient to be transferred to  on   Patient to be transferred to facility by   The following physician request were entered in Epic:   Additional Comments: Patient and family requesting [facility] for offers

## 2011-06-08 NOTE — Progress Notes (Signed)
Patient ID: Willie Ward, male   DOB: 06/19/1940, 71 y.o.   MRN: [id number] Patient looks good today. He is awake and alert and interactive and conversant and seems very appropriate today. Moves all extremities equally. Incision looks good. Awaiting on skilled nursing facility placement. Doing well

## 2011-06-09 NOTE — Progress Notes (Signed)
Patient ID: Willie Ward, male   DOB: 1940-07-03, 71 y.o.   MRN: [id number] BP 97/65  Pulse 80  Temp(Src) 98.6 F (37 C) (Oral)  Resp 18  Ht 5\' 11"  (1.803 m)  Wt 67.4 kg (148 lb 9.4 oz)  BMI 20.72 kg/m2  SpO2 90% [name] is alert and oriented x 4. Speech clear and fluent. No drift.  Perrl, full eom.  Symmetric facies.  Wound clean dry and without signs of infection Awaiting placement, doing well.

## 2011-06-10 NOTE — Progress Notes (Signed)
Subjective: Patient reports no complaints this morning  Objective: Vital signs in last 24 hours: Temp:  [97.2 F (36.2 C)-98.6 F (37 C)] 98 F (36.7 C) (05/05 1100) Pulse Rate:  [79-90] 85  (05/05 1100) Resp:  [18] 18  (05/05 1100) BP: (100-121)/(63-78) 101/66 mmHg (05/05 1100) SpO2:  [90 %-96 %] 94 % (05/05 1100)  Intake/Output from previous day: 05/04 0701 - 05/05 0700 In: 720 [P.O.:720] Out: 200 [Urine:200] Intake/Output this shift:    strength is  symmetric wound is clean and dry  Lab Results: No results found for this basename: WBC:2,HGB:2,HCT:2,PLT:2 in the last 72 hours BMET No results found for this basename: NA:2,K:2,CL:2,CO2:2,GLUCOSE:2,BUN:2,CREATININE:2,CALCIUM:2 in the last 72 hours  Studies/Results: No results found.  Assessment/Plan: Continue PT and OT work on placement  LOS: 10 days     Eual Lindstrom,Mikhi P 06/10/2011, 11:44 AM

## 2011-06-11 NOTE — Progress Notes (Signed)
Patient Willie Ward, 71 year old white male continues to struggle with several challenges to his health.  But patient is happy to be planning his exit from the hospital.  Patient thanked Chaplain for providing pastoral prayer, presence, and conversation.  I will follow-up as needed.

## 2011-06-11 NOTE — Clinical Social Work Note (Signed)
CSW met with pt to provide bed offers and offer to call a facility that he was interested in. Currently, pt only has one bed offer in [facility]. Pt shared that he does not see why he could not go home as he has support at home. Pt reported that he friend Gwynneth Munson would be there 24 hours a day. Pt also shared that he is walking much better with PT and RN. RN confirmed this information. Pt reported that Butch will be at the hospital at noon on 06/12/11 and is able to meet with this CSW to confirm discharge plan of discharging home with home health services. At this time, pt is declining SNF. CSW will follow up with RNCM to arranged home health services for pt. CSW will continue to follow to assist in facilitating discharge plan.   Dede Query, MSW, Theresia Majors [phone]

## 2011-06-11 NOTE — Progress Notes (Signed)
Utilization review completed. Darrow Barreiro, RN, BSN. 06/11/11 

## 2011-06-11 NOTE — Progress Notes (Signed)
Physical Therapy Treatment Patient Details Name: Willie Ward MRN: [id number] DOB: 29-Aug-1940 Today's Date: 06/11/2011 Time: 3244-0102 PT Time Calculation (min): 15 min  PT Assessment / Plan / Recommendation Comments on Treatment Session  pt presents s/p R Frontal SDH post Crani.  pt still with safety deficits, but motivated for increased activity and good participation.      Follow Up Recommendations  Skilled nursing facility    Equipment Recommendations  Defer to next venue    Frequency Min 4X/week   Plan Frequency remains appropriate;Discharge plan needs to be updated    Precautions / Restrictions Precautions Precautions: Fall Restrictions Weight Bearing Restrictions: No   Pertinent Vitals/Pain     Mobility  Bed Mobility Bed Mobility: Supine to Sit;Sitting - Scoot to Edge of Bed Supine to Sit: 6: Modified independent (Device/Increase time);With rails Sitting - Scoot to Edge of Bed: 6: Modified independent (Device/Increase time) Transfers Transfers: Sit to Stand;Stand to Sit Sit to Stand: 4: Min guard;With upper extremity assist;From bed Stand to Sit: 5: Supervision;With upper extremity assist;To bed Details for Transfer Assistance: cues and A for anterior wt shift with sit to stand.   Ambulation/Gait Ambulation/Gait Assistance: 4: Min guard Ambulation Distance (Feet): 200 Feet Assistive device: Rolling walker Ambulation/Gait Assistance Details: cues for safety with RW, attending to task.   Gait Pattern: Step-through pattern;Decreased stride length;Trunk flexed Stairs: No Wheelchair Mobility Wheelchair Mobility: No    Exercises     PT Goals Acute Rehab PT Goals PT Goal: Supine/Side to Sit - Progress: Met PT Goal: Sit to Stand - Progress: Progressing toward goal PT Goal: Stand to Sit - Progress: Progressing toward goal PT Goal: Ambulate - Progress: Progressing toward goal  Visit Information  Last PT Received On: 06/11/11 Assistance Needed: +1      Subjective Data  Subjective: I'll stand guard while you go mess up that clean room.  pt joking with PT throughout session.     Cognition  Overall Cognitive Status: Impaired Area of Impairment: Attention Arousal/Alertness: Awake/alert Orientation Level: Appears intact for tasks assessed Behavior During Session: [facility] for tasks performed Current Attention Level: Selective Following Commands: Follows multi-step commands inconsistently Safety/Judgement: Decreased safety judgement for tasks assessed    Balance  Balance Balance Assessed: Yes Dynamic Standing Balance Dynamic Standing - Balance Support: Bilateral upper extremity supported;During functional activity Dynamic Standing - Level of Assistance: 5: Stand by assistance Dynamic Standing - Comments: pt able to be S with standing dynamic task with Bil UE support pending level of distractions, then can be up to MinA with increased distractions.    End of Session PT - End of Session Equipment Utilized During Treatment: Gait belt Activity Tolerance: Patient tolerated treatment well Patient left: in bed;with call bell/phone within reach;with bed alarm set (Sitting EOB eating breakfast) Nurse Communication: Mobility status    Sunny Schlein, [place] [phone] 06/11/2011, 11:45 AM

## 2011-06-11 NOTE — Progress Notes (Signed)
Patient ID: Willie Ward, male   DOB: July 27, 1940, 71 y.o.   MRN: [id number] Subjective: Patient reports he's doing well.  Objective: Vital signs in last 24 hours: Temp:  [97.5 F (36.4 C)-98.4 F (36.9 C)] 97.9 F (36.6 C) (05/06 1008) Pulse Rate:  [64-80] 72  (05/06 1008) Resp:  [18] 18  (05/06 1008) BP: (95-126)/(62-79) 100/67 mmHg (05/06 1008) SpO2:  [90 %-97 %] 91 % (05/06 1008)  Intake/Output from previous day: 05/05 0701 - 05/06 0700 In: -  Out: 100 [Urine:100] Intake/Output this shift:    Neurologic: Grossly normal  Lab Results: Lab Results  Component Value Date   WBC 3.0* 07/20/2009   HGB 13.9 07/20/2009   HCT 41.7 07/20/2009   MCV 111.2* 07/20/2009   PLT 51* 07/20/2009   Lab Results  Component Value Date   INR 1.4 01/11/2008   BMET Lab Results  Component Value Date   NA 134* 02/09/2011   K 4.8 02/09/2011   CL 98 02/09/2011   CO2 28 02/09/2011   GLUCOSE 84 02/09/2011   BUN 16 02/09/2011   CREATININE 0.93 02/09/2011   CALCIUM 9.3 02/09/2011    Studies/Results: No results found.  Assessment/Plan: Doing well, awaiting SNF   LOS: 11 days    Suzannah Bettes S 06/11/2011, 12:39 PM

## 2011-06-12 ENCOUNTER — Ambulatory Visit: Payer: Medicare Other | Admitting: Family Medicine

## 2011-06-12 NOTE — Progress Notes (Signed)
Physical Therapy Treatment Patient Details Name: Willie Ward MRN: [id number] DOB: [birth year] Today's Date: 06/12/2011 Time:  -     PT Assessment / Plan / Recommendation Comments on Treatment Session  Pt presents s/p crani. Still with safety deficits but showing improvement with mobility and cognition since last week,     Follow Up Recommendations  Skilled nursing facility    Equipment Recommendations  Defer to next venue    Frequency Min 4X/week   Plan Discharge plan remains appropriate;Frequency remains appropriate    Precautions / Restrictions Precautions Precautions: Fall       Mobility  Bed Mobility Supine to Sit: 7: Independent Transfers Sit to Stand: 5: Supervision;From bed;From chair/3-in-1 Stand to Sit: 5: Supervision;To chair/3-in-1 Ambulation/Gait Ambulation/Gait Assistance: 4: Min guard Ambulation Distance (Feet): 250 Feet Assistive device: None Ambulation/Gait Assistance Details: Patient requested to ambulate without RW. No LOB with straightforward ambulation however when conversing more he had a tendency to stop and stagger silghtly Gait Pattern: Step-through pattern;Decreased stride length Stairs Assistance: 4: Min guard Stair Management Technique: No rails;Alternating pattern;Forwards Number of Stairs: 10     Exercises     PT Goals Acute Rehab PT Goals PT Goal: Supine/Side to Sit - Progress: Met PT Goal: Sit to Supine/Side - Progress: Met PT Goal: Sit to Stand - Progress: Progressing toward goal PT Goal: Stand to Sit - Progress: Progressing toward goal PT Goal: Ambulate - Progress: Progressing toward goal  Visit Information  Last PT Received On: 06/12/11    Subjective Data  Subjective: I just like to cause a little trouble.    Cognition  Overall Cognitive Status: Impaired Area of Impairment: Memory;Awareness of deficits Arousal/Alertness: Awake/alert Orientation Level: Appears intact for tasks assessed Behavior During Session: [facility] for  tasks performed Current Attention Level: Selective Memory: Decreased recall of precautions Following Commands: Follows multi-step commands inconsistently Safety/Judgement: Decreased safety judgement for tasks assessed    Balance  Timed Up and Go Test TUG: Manual TUG Manual TUG (seconds): 18.3   End of Session PT - End of Session Equipment Utilized During Treatment: Gait belt Activity Tolerance: Patient tolerated treatment well Patient left: in chair;with call bell/phone within reach Nurse Communication: Mobility status    Fredrich Birks 06/12/2011, 12:12 PM  06/12/2011 Fredrich Birks PTA [phone] pager [phone] office

## 2011-06-12 NOTE — Clinical Social Work Note (Signed)
CSW met with pt to address discharge plan. Pt was accompanied by Norva Karvonen [phone]) and Orpah Cobb [phone]). Pt shared the he prefers to discharge home with home health services and declined SNF. Pt reported the Ace Gins will be staying with him to provide 24 hour supervision. Ace Gins shared that he will be available to be with pt 24 hours a day to provide support as well as working with PT. Orpah Cobb shared that he is able to provide transportation home and will available to assist in supervision as well. Pt, Ace Gins, and Treyvone are all agreeable with discharge plan. CSW updated RNCM about pt's wishes to discharge home with home health services. RNCM is aware and following. CSW is signing off as no further clinical social work needs identified. Please call if a need arises prior to discharge.   Dede Query, MSW, Theresia Majors [phone]

## 2011-06-12 NOTE — Progress Notes (Signed)
Patient ID: Willie Ward, male   DOB: [birth year], 71 y.o.   MRN: [id number] Doing well. Amb with me today and did well. Maybe home tomorrow with [facility] if all agreeable and 24hr care is arranged.

## 2011-06-12 NOTE — Progress Notes (Signed)
Occupational Therapy Treatment Patient Details Name: Willie Ward MRN: [id number] DOB: 10-02-1940 Today's Date: 06/12/2011 Time: 6213-0865 OT Time Calculation (min): 24 min  OT Assessment / Plan / Recommendation Comments on Treatment Session Pt reports that "Bucky" will arrive today to (A) with going home. Pt will require (A) with bathing due to decreased balance with single leg standing. Pt high fall risk.     Follow Up Recommendations  Home health OT;Skilled nursing facility (recommend SNF but plans to d/c home)    Equipment Recommendations  Defer to next venue    Frequency Min 2X/week   Plan Discharge plan needs to be updated    Precautions / Restrictions Precautions Precautions: Fall   Pertinent Vitals/Pain none    ADL  Eating/Feeding: Independent;Set up Where Assessed - Eating/Feeding: Edge of bed Grooming: Performed;Wash/dry hands;Wash/dry face;Supervision/safety Where Assessed - Grooming: Standing at sink Upper Body Bathing: Performed;Chest;Right arm;Left arm;Abdomen;Supervision/safety Where Assessed - Upper Body Bathing: Standing at sink Lower Body Bathing: Performed;Supervision/safety Where Assessed - Lower Body Bathing: Standing at sink Upper Body Dressing: Performed;Supervision/safety Where Assessed - Upper Body Dressing: Standing Toilet Transfer: Simulated;Supervision/safety Toilet Transfer Method: Proofreader: Raised toilet seat with arms (or 3-in-1 over toilet) Tub/Shower Transfer: Simulated;Minimal assistance Tub/Shower Transfer Method: Science writer:  (pt requires use of grab bars for safety) Equipment Used: Gait belt Ambulation Related to ADLs: Pt ambulating in hall S level with unsteady gait with head turns.  ADL Comments: Pt requires the use of grab bars for shower transfers and fall risk with single leg standing. Pt will requires (A) for bathing due to fall risk. Pt completed sponge bath at sink with  Supervision. pt with side step once and able to self correct LOB    OT Goals Acute Rehab OT Goals OT Goal Formulation: With patient Time For Goal Achievement: 06/25/11 Potential to Achieve Goals: Good ADL Goals Pt Will Perform Grooming: with set-up;Supported;Standing at sink ADL Goal: Grooming - Progress: Met Pt Will Perform Upper Body Bathing: with set-up;Sit to stand from bed;Sit to stand from chair ADL Goal: Upper Body Bathing - Progress: Met Pt Will Perform Lower Body Bathing: with set-up;Sit to stand from chair;Sit to stand from bed ADL Goal: Lower Body Bathing - Progress: Met Pt Will Perform Upper Body Dressing: with set-up;Sit to stand from chair;Sit to stand from bed ADL Goal: Upper Body Dressing - Progress: Met Pt Will Perform Lower Body Dressing: with set-up;Sit to stand from chair;Sit to stand from bed ADL Goal: Lower Body Dressing - Progress: Met Pt Will Transfer to Toilet: with set-up;3-in-1 ADL Goal: Toilet Transfer - Progress: Met Pt Will Perform Toileting - Clothing Manipulation: Sitting on 3-in-1 or toilet;with set-up ADL Goal: Toileting - Clothing Manipulation - Progress: Met Pt Will Perform Toileting - Hygiene: with set-up;Sit to stand from 3-in-1/toilet ADL Goal: Toileting - Hygiene - Progress: Met Pt Will Perform Tub/Shower Transfer: with modified independence;Ambulation ADL Goal: Tub/Shower Transfer - Progress: Goal set today  Visit Information  Last OT Received On: 06/11/11 Assistance Needed: +1    Subjective Data      Prior Functioning       Cognition  Overall Cognitive Status: Impaired Area of Impairment: Memory;Awareness of deficits Orientation Level: Appears intact for tasks assessed Behavior During Session: [facility] for tasks performed Current Attention Level: Selective Memory: Decreased recall of precautions Following Commands: Follows multi-step commands inconsistently Safety/Judgement: Decreased safety judgement for tasks assessed    Mobility  Transfers Transfers: Sit to Stand;Stand to Sit Sit to Stand:  5: Supervision;With upper extremity assist;From bed Stand to Sit: 5: Supervision;With upper extremity assist;To chair/3-in-1;With armrests Details for Transfer Assistance: pt completed the TUG with decreased speed with cognitive challenges. Pt self reports "i am not an educated man but I will try my best" Therapist discussed with patient areas of strong knowledge and [place] is an area pt self reports feeling strongly about. Pt ambulated and answered questions about flag color for nascar. Pt demonstrates high fall risk 21 seconds   Exercises    Balance Balance Balance Assessed: Yes Dynamic Standing Balance Dynamic Standing - Balance Support: Bilateral upper extremity supported Dynamic Standing - Level of Assistance: 5: Stand by assistance Dynamic Standing - Balance Activities: Reaching for objects Dynamic Standing - Comments: Pt demonstrates balance deficits with head turns  Standardized Balance Assessment Standardized Balance Assessment: Timed Up and Go Test Timed Up and Go Test TUG: Cognitive TUG Cognitive TUG (seconds): 21   End of Session OT - End of Session Equipment Utilized During Treatment: Gait belt Activity Tolerance: Patient tolerated treatment well Patient left: in chair;with call bell/phone within reach Nurse Communication: Mobility status   Lucile Shutters 06/12/2011, 8:29 AM Pager: [phone]

## 2011-06-12 NOTE — Care Management Note (Signed)
    Page 1 of 2   06/13/2011     11:45:48 AM   CARE MANAGEMENT NOTE 06/13/2011  Patient:  Willie Ward, Willie Ward   Account Number:  [id number]  Date Initiated:  06/01/2011  Documentation initiated by:  Willie Ward  Subjective/Objective Assessment:   s/p Diagnosis - Craniotomy  Craniotomy for SDH evacuation  71 year old alcoholic male who states he drinks about 10 beers per day who's had multiple falls.     Action/Plan:   Anticipated DC Date:  06/13/2011   Anticipated DC Plan:  HOME W HOME HEALTH SERVICES  In-house referral  Clinical Social Worker         [facility] Choice  HOME HEALTH   Choice offered to / List presented to:  C-1 Patient        HH arranged  HH-2 PT  HH-3 OT  HH-4 NURSE'S AIDE      HH agency  [facility]   Status of service:  Completed, signed off Medicare Important Message given?   (If response is "NO", the following Medicare IM given date fields will be blank) Date Medicare IM given:   Date Additional Medicare IM given:    Discharge Disposition:  HOME W HOME HEALTH SERVICES  Per UR Regulation:  Reviewed for med. necessity/level of care/duration of stay  If discussed at Long Length of Stay Meetings, dates discussed:    Comments:  06/13/11 Spoke with patient and confrmed that he chose [facility] for HHPT, HHOT and an aide. Patient states a friend stays with him and will be with him 24 hrs a day. Contacted Willie Ward at [facility] and requested HHPT, OT and an aide. Willie Cree RN, BSN, CCM  06/12/11 Willie AMERSON,RN,BSN 1350 CSW MET WITH PT TODAY TO FINALIZE DC PLANS.  PT DESIRES TO GO HOME, AND HE DOES HAVE 24HR SUPERVISION AT HOME.  HE WILL NEED HOME HEALTH CARE FOLLOW UP, AND CHOOSES [facility] FOR HOME CARE NEEDS.  MD:  PLEASE ORDER HOME CARE; RNCM WILL ARRANGE.

## 2011-06-13 MED ORDER — OXYCODONE-ACETAMINOPHEN 5-325 MG PO TABS: 1.0000 | ORAL_TABLET | Freq: Four times a day (QID) | ORAL | Status: DC | PRN

## 2011-06-13 NOTE — Progress Notes (Signed)
Pt has ben waiting all day for discharge orders, friends arrived at lunchtime, explained that was waiting for Dr. Yetta Barre to write discharge orders, and not sure how long the would have to wait.   At about 1415 friends left,  Dr Yetta Barre in to see pt,  Put in discharge orders.  I was unable to print discharge instructions, notified Dr. Yetta Barre,  Pt left before could complete discharge insruction sheet.

## 2011-06-13 NOTE — Discharge Summary (Signed)
Physician Discharge Summary  Patient ID: Willie Ward MRN: [id number] DOB/AGE: Jun 05, 1940 71 y.o.  Admit date: 05/31/2011 Discharge date: 06/13/2011  Admission Diagnoses: R SDH    Discharge Diagnoses: same   Discharged Condition: stable  Hospital Course: The patient was admitted on 05/31/2011 and taken to the operating room where the patient underwent a right craniotomy for SDH. The patient tolerated the procedure well and was taken to the recovery room and then to the icu in stable condition. The hospital course was routine. There were no complications. The wound remained clean dry and intact. Pt had appropriate headache. No complaints of  new N/T/W. The patient remained afebrile with stable vital signs, and tolerated a regular diet. The patient continued to increase activities, and pain was well controlled with oral pain medications. No seizures. Had early DTs controlled with our protocol. Was unstable on feet, but gained stability with PT/OT.   Consults: None  Significant Diagnostic Studies:  Results for orders placed during the hospital encounter of 05/31/11  MRSA PCR SCREENING      Component Value Range   MRSA by PCR NEGATIVE  NEGATIVE     Dg Chest 2 View  06/06/2011  *RADIOLOGY REPORT*  Clinical Data: Weakness, infiltrate, COPD, history prostate cancer  CHEST - 2 VIEW  Comparison: 11/07/2010  Findings: Normal heart size and pulmonary vascularity. Atherosclerotic calcifications thoracic aorta. Severe emphysematous and bronchitic changes. Post thoracotomy changes left hemithorax. Scattered calcified pleural plaques bilaterally. Bibasilar densities question atelectasis. No definite acute infiltrate or pleural effusion. Bones appear diffusely demineralized.  IMPRESSION: Emphysematous and bronchitic changes compatible with COPD. Post thoracotomy changes left chest. Calcified pleural plaques bilaterally question asbestos exposure. Bibasilar atelectasis.  Original Report Authenticated By:  Lollie Marrow, M.D.   Ct Head Wo Contrast  06/04/2011  *RADIOLOGY REPORT*  Clinical Data: Followup craniotomy for intracranial hemorrhage.  CT HEAD WITHOUT CONTRAST  Technique:  Contiguous axial images were obtained from the base of the skull through the vertex without contrast.  Comparison: 06/01/2011.  Findings: Post right frontal craniotomy for drainage of subdural collection with drain remaining in place.  The amount of pneumocephalus has decreased slightly.  Decrease in size of the hypodense subdural collection posterior right frontal - temporal and parietal lobe.  There remains extra- axial blood adjacent to the craniotomy with mild mass effect upon the right frontal horn.  Midline shift to the left by 2.8 mm.  Small vessel disease type changes.  No CT evidence of large acute thrombotic infarct.  No intracranial mass lesion detected on this unenhanced exam.  Vascular calcifications.  IMPRESSION: Decrease in size of right sided extra-axial collection as detailed above.  Original Report Authenticated By: Fuller Canada, M.D.   Ct Head Wo Contrast  06/01/2011  *RADIOLOGY REPORT*  Clinical Data:  CT fracture.  Recent fall.  Status post craniotomy for intracranial hemorrhage.  CT HEAD WITHOUT CONTRAST CT CERVICAL SPINE WITHOUT CONTRAST  Technique:  Multidetector CT imaging of the head and cervical spine was performed following the standard protocol without intravenous contrast.  Multiplanar CT image reconstructions of the cervical spine were also generated.  Comparison:  CT head without contrast 02/05/2009.  CT cervical spine 06/30/2009.  CT HEAD  Findings: The patient is status post right frontal craniotomy.  A drain is in place within an extra-axial fluid collection on the right.  Hyperdense material is seen anteriorly.  More isodense fluid is seen posteriorly with some hyperdense areas.  The there is gas within the operative site.  There remains some midline shift, measuring 3-4 mm at the foramen of  Monro.  A remote lacunar infarct is noted in the left caudate head.  No acute cortical infarct or parenchymal hemorrhage is present.  There is no mass lesion.  The right frontal craniotomy defect is evident.  The fluid is present within the inferior right frontal sinus, less than on the prior study.  The paranasal sinuses and mastoid air cells are otherwise clear.  IMPRESSION:  1.  Persistent right extra-axial fluid collection with mixed density blood products, more acute anteriorly. 2.  A surgical drain is in place. 3.  Residual midline shift measures 3 mm. 4.  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  CT CERVICAL SPINE  Findings: There is now solid fusion of the previous CT fracture. The previously measured anterolisthesis of C1-2 is stable with anterior displacement of the dens relative to the body of C2. Uncovertebral disease remains at C2-3 without significant stenosis. Anterior displacement of the posterior arch of C1 is stable.  Anterolisthesis at C4-5 is stable.  Chronic loss of disc height and C5-6 and C6-7 is stable.  There is partial fusion at C5-6.  Facet hypertrophy is most noted on the left at C3-4 on the right at C4-5 and C7-T1.  Emphysematous changes are noted at the lung apices bilaterally. Atherosclerotic changes are present in the carotid bifurcations and at the aortic arch.  IMPRESSION:  1.  Interval solid fusion of the C2 fracture. 2.  Persistent anterior displacement of the C1 arch with residual anterior displacement of the dens relative to the body of C2. 3.  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 4.  No acute fracture or traumatic subluxation is evident.  Original Report Authenticated By: Jamesetta Orleans. MATTERN, M.D.   Ct Cervical Spine Wo Contrast  06/01/2011  *RADIOLOGY REPORT*  Clinical Data:  CT fracture.  Recent fall.  Status post craniotomy for intracranial hemorrhage.  CT HEAD WITHOUT CONTRAST CT CERVICAL SPINE WITHOUT CONTRAST  Technique:  Multidetector CT  imaging of the head and cervical spine was performed following the standard protocol without intravenous contrast.  Multiplanar CT image reconstructions of the cervical spine were also generated.  Comparison:  CT head without contrast 02/05/2009.  CT cervical spine 06/30/2009.  CT HEAD  Findings: The patient is status post right frontal craniotomy.  A drain is in place within an extra-axial fluid collection on the right.  Hyperdense material is seen anteriorly.  More isodense fluid is seen posteriorly with some hyperdense areas.  The there is gas within the operative site.  There remains some midline shift, measuring 3-4 mm at the foramen of Monro.  A remote lacunar infarct is noted in the left caudate head.  No acute cortical infarct or parenchymal hemorrhage is present.  There is no mass lesion.  The right frontal craniotomy defect is evident.  The fluid is present within the inferior right frontal sinus, less than on the prior study.  The paranasal sinuses and mastoid air cells are otherwise clear.  IMPRESSION:  1.  Persistent right extra-axial fluid collection with mixed density blood products, more acute anteriorly. 2.  A surgical drain is in place. 3.  Residual midline shift measures 3 mm. 4.  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  CT CERVICAL SPINE  Findings: There is now solid fusion of the previous CT fracture. The previously measured anterolisthesis of C1-2 is stable with anterior displacement of the dens relative to the body of  C2. Uncovertebral disease remains at C2-3 without significant stenosis. Anterior displacement of the posterior arch of C1 is stable.  Anterolisthesis at C4-5 is stable.  Chronic loss of disc height and C5-6 and C6-7 is stable.  There is partial fusion at C5-6.  Facet hypertrophy is most noted on the left at C3-4 on the right at C4-5 and C7-T1.  Emphysematous changes are noted at the lung apices bilaterally. Atherosclerotic changes are present in the carotid bifurcations  and at the aortic arch.  IMPRESSION:  1.  Interval solid fusion of the C2 fracture. 2.  Persistent anterior displacement of the C1 arch with residual anterior displacement of the dens relative to the body of C2. 3.  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 4.  No acute fracture or traumatic subluxation is evident.  Original Report Authenticated By: Jamesetta Orleans. MATTERN, M.D.    Antibiotics:  Anti-infectives     Start     Dose/Rate Route Frequency Ordered Stop   05/31/11 2157   bacitracin 50,000 Units in sodium chloride irrigation 0.9 % 500 mL irrigation  Status:  Discontinued          As needed 05/31/11 2204 05/31/11 2256          Discharge Exam: Blood pressure 104/64, pulse 88, temperature 98.1 F (36.7 C), temperature source Oral, resp. rate 20, height 5\' 11"  (1.803 m), weight 67.4 kg (148 lb 9.4 oz), SpO2 97.00%. Neurologic: Grossly normal Incision well healed  Discharge Medications:   Medication List  As of 06/13/2011  2:51 PM   TAKE these medications         albuterol 90 MCG/ACT inhaler   Commonly known as: PROVENTIL,VENTOLIN   Inhale 2 puffs into the lungs every 6 (six) hours as needed for wheezing.      ALPRAZolam 1 MG tablet   Commonly known as: XANAX   TAKE 1 TABLET BY MOUTH TWICE DAILY AS NEEDED FOR ANXIETY      amitriptyline 50 MG tablet   Commonly known as: ELAVIL   TAKE 1 TABLET BY MOUTH AT BEDTIME      aspirin 81 MG tablet   Take 81 mg by mouth daily.      ENSURE   Take 237 mLs by mouth 3 (three) times daily between meals.      FLUoxetine 40 MG capsule   Commonly known as: PROZAC   TAKE ONE CAPSULE BY MOUTH DAILY      lisinopril 40 MG tablet   Commonly known as: PRINIVIL,ZESTRIL   Take 1 tablet (40 mg total) by mouth daily.      metoprolol succinate 50 MG 24 hr tablet   Commonly known as: TOPROL-XL   Take 1 tablet (50 mg total) by mouth daily.      oxyCODONE-acetaminophen 5-325 MG per tablet   Commonly known as: PERCOCET   Take 1-2  tablets by mouth every 6 (six) hours as needed for pain.      tiotropium 18 MCG inhalation capsule   Commonly known as: SPIRIVA   Place 1 capsule (18 mcg total) into inhaler and inhale daily.         ASK your doctor about these medications         levETIRAcetam 500 MG 24 hr tablet   Commonly known as: KEPPRA XR   Take 1 tablet (500 mg total) by mouth daily.      traMADol 50 MG tablet   Commonly known as: ULTRAM   Take 50 mg by  mouth every 8 (eight) hours as needed.            Disposition: home   Final Dx: Craniotomy for SDH  Discharge Orders    Future Orders Please Complete By Expires   Diet - low sodium heart healthy      Increase activity slowly      Call MD for:  temperature >100.4      Call MD for:  persistant nausea and vomiting      Call MD for:  severe uncontrolled pain      Call MD for:  redness, tenderness, or signs of infection (pain, swelling, redness, odor or green/yellow discharge around incision site)      Call MD for:  difficulty breathing, headache or visual disturbances      Call MD for:  persistant dizziness or light-headedness      Driving Restrictions      Comments:   No driving      Follow-up Information    Follow up with Deshondra Worst S, MD. Schedule an appointment as soon as possible for a visit in 2 weeks.   Contact information:   [address] [place] [address] [phone]           Signed: Tia Alert 06/13/2011, 2:51 PM

## 2011-06-13 NOTE — Progress Notes (Signed)
Agree with PTA.    Yadiel Aubry, PT [phone]  

## 2011-06-13 NOTE — Progress Notes (Signed)
Physical Therapy Treatment Patient Details Name: Willie Ward MRN: [id number] DOB: [birth year] Today's Date: 06/13/2011 Time: 3244-0102 PT Time Calculation (min): 18 min  PT Assessment / Plan / Recommendation Comments on Treatment Session  Patient has set up someone to be at home with him throughout the day and night. Patient continues to make improvement    Follow Up Recommendations  Home health PT;Supervision/Assistance - 24 hour    Equipment Recommendations  None recommended by PT    Frequency Min 4X/week   Plan Discharge plan remains appropriate    Precautions / Restrictions Precautions Precautions: Fall   Pertinent Vitals/Pain     Mobility  Bed Mobility Sit to Supine: 7: Independent Transfers Sit to Stand: 5: Supervision Stand to Sit: 5: Supervision Ambulation/Gait Ambulation/Gait Assistance: 4: Min guard Ambulation Distance (Feet): 300 Feet Assistive device: None Ambulation/Gait Assistance Details: Patient ambulating with decreased staggering this session. He states that he feels a little stronger. See DGI report for dynamic balance deficits Gait Pattern: Step-through pattern;Decreased stride length;Trunk flexed Stairs Assistance: 5: Supervision Stair Management Technique: Two rails;Forwards Number of Stairs: 5  (patient went up and over x4)    Exercises     PT Goals Acute Rehab PT Goals PT Goal: Sit to Stand - Progress: Progressing toward goal PT Goal: Stand to Sit - Progress: Progressing toward goal PT Goal: Ambulate - Progress: Progressing toward goal  Visit Information  Last PT Received On: 06/13/11 Assistance Needed: +1    Subjective Data  Subjective: " I should have done exercises in the bed but I was worried about that alarm going off"   Cognition  Overall Cognitive Status: Impaired Area of Impairment: Safety/judgement;Awareness of deficits Arousal/Alertness: Awake/alert Orientation Level: Appears intact for tasks assessed Behavior During  Session: [facility] for tasks performed Current Attention Level: Selective Memory: Decreased recall of precautions Following Commands: Follows multi-step commands with increased time Safety/Judgement: Decreased safety judgement for tasks assessed    Balance  Dynamic Gait Index Level Surface: Mild Impairment Change in Gait Speed: Mild Impairment Gait with Horizontal Head Turns: Severe Impairment Gait with Vertical Head Turns: Mild Impairment Gait and Pivot Turn: Mild Impairment Step Over Obstacle: Moderate Impairment Step Around Obstacles: Mild Impairment Steps: Mild Impairment Total Score: 13   End of Session PT - End of Session Equipment Utilized During Treatment: Gait belt Activity Tolerance: Patient tolerated treatment well Patient left: in chair;with call bell/phone within reach Nurse Communication: Mobility status    Fredrich Birks 06/13/2011, 7:56 AM 06/13/2011 Fredrich Birks PTA [phone] pager [phone] office

## 2011-06-18 ENCOUNTER — Ambulatory Visit (INDEPENDENT_AMBULATORY_CARE_PROVIDER_SITE_OTHER): Payer: Medicare Other | Admitting: Family Medicine

## 2011-06-18 ENCOUNTER — Encounter: Payer: Self-pay | Admitting: Family Medicine

## 2011-06-18 VITALS — BP 101/64 | HR 106 | Temp 97.5°F | Ht 71.0 in | Wt 148.0 lb

## 2011-06-18 DIAGNOSIS — F419 Anxiety disorder, unspecified: Secondary | ICD-10-CM

## 2011-06-18 DIAGNOSIS — S065X9A Traumatic subdural hemorrhage with loss of consciousness of unspecified duration, initial encounter: Secondary | ICD-10-CM

## 2011-06-18 DIAGNOSIS — F411 Generalized anxiety disorder: Secondary | ICD-10-CM

## 2011-06-18 DIAGNOSIS — F1010 Alcohol abuse, uncomplicated: Secondary | ICD-10-CM

## 2011-06-18 DIAGNOSIS — I6200 Nontraumatic subdural hemorrhage, unspecified: Secondary | ICD-10-CM

## 2011-06-18 DIAGNOSIS — Z9181 History of falling: Secondary | ICD-10-CM

## 2011-06-18 MED ORDER — ALPRAZOLAM 1 MG PO TABS: 1.0000 mg | ORAL_TABLET | Freq: Two times a day (BID) | ORAL | Status: DC | PRN

## 2011-06-18 MED ORDER — AMBULATORY NON FORMULARY MEDICATION: Status: DC

## 2011-06-18 NOTE — Progress Notes (Signed)
  Subjective:    Patient ID: Willie Ward, male    DOB: 22-Apr-1940, 71 y.o.   MRN: [id number]  HPI [facility]. He is an alcoholic and drinks about 10 beers per day. He has been falling frequently. In fact we called him back in March to see if he wanted to get in to physical therapy for his imbalance and falls. He said he would get back to [place] after he checked with his insurance company but we never heard back from him. After several falls that day he had a severe headache and went to the emergency room. It was progressive. They did a CT scan that showed a right subdural hematoma. Surgery was then called and the hematoma was evacuated. Patient tolerated the procedure well. He was discharged home on May 8, 5 days ago. He is supposed to followup with neurology in about a week.Still slight HA last night. Down to 2ppd on smoking. Down to 2 beers per day.    Reviewed notes from [facility].   Review of Systems     Objective:   Physical Exam  Constitutional: He is oriented to person, place, and time. He appears well-developed and well-nourished.  HENT:  Head: Normocephalic and atraumatic.  Neurological: He is alert and oriented to person, place, and time.       Alert and oriented.  CN 2-12 intact.  Neg rhomberg. Normal rapid alternating movements of hands. Reflexes symmetric in the UE and LE.  Normal knee to ankle, down the shin bilaterally.  + temor, intention tremor.  Albe to do finger to nose.     Skin: Skin is warm and dry.  Psychiatric: He has a normal mood and affect. His behavior is normal.          Assessment & Plan:  Subdural hematoma-Doing well. Neuro exam is normal. Check BMP today.  F/U in 1 months. Make sure keep appt next week with neuro.    Fall prevention-He is getting help form homehealth, will add order for fall preventation through [facility]. Says not longer drives. Gave up his license.   Anxiety- Continue to  wean xanax as has been the cause of several seizures in the past.  Dec quantity to 40 tabs for 1 month supply. Then dec to 30 tabs next month.  Continue to wean as he is high risk for this medication.   Alcoholism-Says he is down to 2 beers per night and says not ready to start a quit program.

## 2011-06-18 NOTE — Patient Instructions (Addendum)
Miralax twice a day for your bowels. Once more normal then can decrease to once a day.

## 2011-06-19 LAB — BASIC METABOLIC PANEL
BUN: 38 mg/dL — ABNORMAL HIGH (ref 6–23)
Chloride: 92 mEq/L — ABNORMAL LOW (ref 96–112)
Creat: 1.2 mg/dL (ref 0.50–1.35)
Glucose, Bld: 107 mg/dL — ABNORMAL HIGH (ref 70–99)

## 2011-06-20 ENCOUNTER — Other Ambulatory Visit: Payer: Self-pay | Admitting: Family Medicine

## 2011-06-25 ENCOUNTER — Other Ambulatory Visit: Payer: Self-pay | Admitting: Family Medicine

## 2011-06-26 ENCOUNTER — Ambulatory Visit (INDEPENDENT_AMBULATORY_CARE_PROVIDER_SITE_OTHER): Payer: Medicare Other | Admitting: Family Medicine

## 2011-06-26 ENCOUNTER — Encounter: Payer: Self-pay | Admitting: Family Medicine

## 2011-06-26 VITALS — BP 103/75 | HR 85 | Temp 97.9°F | Ht 71.0 in | Wt 151.0 lb

## 2011-06-26 DIAGNOSIS — Z9181 History of falling: Secondary | ICD-10-CM

## 2011-06-26 DIAGNOSIS — E871 Hypo-osmolality and hyponatremia: Secondary | ICD-10-CM

## 2011-06-26 DIAGNOSIS — G629 Polyneuropathy, unspecified: Secondary | ICD-10-CM

## 2011-06-26 DIAGNOSIS — R51 Headache: Secondary | ICD-10-CM

## 2011-06-26 DIAGNOSIS — G589 Mononeuropathy, unspecified: Secondary | ICD-10-CM

## 2011-06-26 DIAGNOSIS — S065X9A Traumatic subdural hemorrhage with loss of consciousness of unspecified duration, initial encounter: Secondary | ICD-10-CM

## 2011-06-26 DIAGNOSIS — I6200 Nontraumatic subdural hemorrhage, unspecified: Secondary | ICD-10-CM

## 2011-06-26 DIAGNOSIS — F1020 Alcohol dependence, uncomplicated: Secondary | ICD-10-CM | POA: Insufficient documentation

## 2011-06-26 DIAGNOSIS — F1010 Alcohol abuse, uncomplicated: Secondary | ICD-10-CM

## 2011-06-26 DIAGNOSIS — R296 Repeated falls: Secondary | ICD-10-CM

## 2011-06-26 LAB — CBC
HCT: 40.3 % (ref 39.0–52.0)
Hemoglobin: 13.3 g/dL (ref 13.0–17.0)
MCH: 31 pg (ref 26.0–34.0)
MCHC: 33 g/dL (ref 30.0–36.0)
MCV: 93.9 fL (ref 78.0–100.0)

## 2011-06-26 MED ORDER — OXYCODONE-ACETAMINOPHEN 5-325 MG PO TABS: 1.0000 | ORAL_TABLET | Freq: Four times a day (QID) | ORAL | Status: AC | PRN

## 2011-06-26 MED ORDER — AMBULATORY NON FORMULARY MEDICATION: Status: DC

## 2011-06-26 NOTE — Progress Notes (Signed)
  Subjective:    Patient ID: Willie Ward, male    DOB: [birth year], 71 y.o.   MRN: [id number]  HPI Hx of subdural hematoma. Says HE rean out of pain meds yesterday. Having a HA.  HA is 7/10.  Says it is throbbing intermittantly. Can last an hour.  Somestime has some vision changes.  Has appt with neurology next week on the 28th.  He is still falling a lot. He has a friend here with him today.   He is also complaining of numbness in both hands along the fingers. The he has tingling in the fingertips. He's had this for quite some time. He denies any numbness certainly in his feet. The he is still falling very frequently.   Review of Systems     Objective:   Physical Exam  Constitutional: He is oriented to person, place, and time. He appears well-developed and well-nourished.  HENT:  Head: Normocephalic and atraumatic.  Eyes: Conjunctivae and EOM are normal.       Pupils fixed from cataract surgery.   Neurological: He is alert and oriented to person, place, and time.       Alert and oriented.  CN 2-12 intact.  Normal rapid alternating movements of hands was irregular . Reflexes symmetric in the UE and LE.  Normal knee to ankle, down the shin bilaterally.  No tremor.  Finger to nose was normal. No asterixis.           Assessment & Plan:  Subdural hematoma-he is still having headaches. Going to refill his Percocet and does follow with Neurosurgery next week. At that point time most likely they will consider weaning the medication. He also could be developing rebound headaches because she's been on the medication chronically at this point.  Frequent falls-  We will make a referral to [facility] he was RE seen him in the home starting this week for a fall prevention program. This is likely related to his chronic alcohol use.  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 Though he is an alcoholic. We will check CBC, TSH, ferritin, folate, et  Karie Soda.  Hyponatremia-recheck level today. Most likely related to head injury. Lab Results  Component Value Date   WBC 3.0* 07/20/2009   HGB 13.9 07/20/2009   HCT 41.7 07/20/2009   PLT 51* 07/20/2009   GLUCOSE 107* 06/18/2011   ALT 9 12/19/2010   AST 24 12/19/2010   NA 128* 06/18/2011   K 5.2 06/18/2011   CL 92* 06/18/2011   CREATININE 1.20 06/18/2011   BUN 38* 06/18/2011   CO2 24 06/18/2011   TSH 2.605 Test methodology is 3rd generation TSH 07/20/2009   INR 1.4 01/11/2008

## 2011-06-26 NOTE — Patient Instructions (Signed)
We will call you with your lab results. If you don't here from us in about a week then please give us a call at [phone].  

## 2011-06-27 LAB — BASIC METABOLIC PANEL WITH GFR
BUN: 18 mg/dL (ref 6–23)
Calcium: 9.3 mg/dL (ref 8.4–10.5)
Creat: 1.08 mg/dL (ref 0.50–1.35)
GFR, Est African American: 81 mL/min
GFR, Est Non African American: 70 mL/min
Potassium: 4.7 mEq/L (ref 3.5–5.3)

## 2011-06-27 LAB — FERRITIN: Ferritin: 89 ng/mL (ref 22–322)

## 2011-07-09 ENCOUNTER — Encounter: Payer: Self-pay | Admitting: Family Medicine

## 2011-07-09 ENCOUNTER — Ambulatory Visit (INDEPENDENT_AMBULATORY_CARE_PROVIDER_SITE_OTHER): Payer: Medicare Other | Admitting: Family Medicine

## 2011-07-09 VITALS — BP 149/89 | HR 121 | Temp 97.8°F | Ht 71.0 in | Wt 149.0 lb

## 2011-07-09 DIAGNOSIS — G4700 Insomnia, unspecified: Secondary | ICD-10-CM

## 2011-07-09 DIAGNOSIS — F1020 Alcohol dependence, uncomplicated: Secondary | ICD-10-CM

## 2011-07-09 DIAGNOSIS — S065X9A Traumatic subdural hemorrhage with loss of consciousness of unspecified duration, initial encounter: Secondary | ICD-10-CM

## 2011-07-09 DIAGNOSIS — I809 Phlebitis and thrombophlebitis of unspecified site: Secondary | ICD-10-CM

## 2011-07-09 DIAGNOSIS — R51 Headache: Secondary | ICD-10-CM

## 2011-07-09 MED ORDER — AMITRIPTYLINE HCL 100 MG PO TABS: 100.0000 mg | ORAL_TABLET | Freq: Every day | ORAL | Status: DC

## 2011-07-09 NOTE — Progress Notes (Signed)
  Subjective:    Patient ID: Willie Ward, male    DOB: 11/23/1940, 71 y.o.   MRN: [id number]  HPI HA is a 10/10 today.  Has appt on 07/24/11 with Dr. Yetta Barre at [facility], brain and spine.  He gave him pain medication but the pharmacy wouldn't fill it until they heard from me since I wrote the last rx.  HAs been using a walker for gait, since has been off balance. HA are dull and throbbing. Out of pain meds for 5 days.  No fever.    Went to [facility] ED on 5/30/13for swollen veins in his arm.  He had thrombus in the RT cephalic vein   Put on keflex. Still on the ABX.  Noticing some improvement.  Sodium was low.  Dec appetite the last 3 days. Also given IBU in ED as well.   Off his xanax x 1 weeks.    Review of Systems     Objective:   Physical Exam  Constitutional: He is oriented to person, place, and time. He appears well-developed and well-nourished.  HENT:  Head: Normocephalic and atraumatic.  Cardiovascular: Normal rate, regular rhythm and normal heart sounds.   Pulmonary/Chest: Effort normal and breath sounds normal.  Neurological: He is alert and oriented to person, place, and time.  Skin: Skin is warm and dry.  Psychiatric: He has a normal mood and affect. His behavior is normal.          Assessment & Plan:  HA/Subdural hematoma- Will call [facility] to see if they will fill the rx written by Dr. Marikay Alar. HA may be worse since stopped meds abruptly 4 days ago. Keep f/u on 6/18 with Dr. Yetta Barre. BP is up today. Was well controlled 2 weeks ago. Maybe pain related. Under call the pharmacy. Evidently Dr. Yetta Barre had sent a prescription but he canceled it after realizing I have given a prescription week before. We then called Dr. Edward Qualia office so that they would know I'm not going to chronically manage his pain medication after his subdural hematoma. They can continue to follow prescriptions. I was merely covering the gap until his followup appointment.  Inosmnia - RF amitryptiline. Inc  dose to 100mg  daily.He has been out of med for about a week. May also be contributing to his insomnia.   Thrombophlebitis- Doing well. Continue warm compresses. Continue ABX. F/U in 2 weeks.  Call if suddenly gets worse or has a fever.

## 2011-07-09 NOTE — Patient Instructions (Signed)

## 2011-07-10 ENCOUNTER — Other Ambulatory Visit: Payer: Self-pay | Admitting: *Deleted

## 2011-07-10 NOTE — Telephone Encounter (Signed)
Pt has called requesting a refill on Oxycodone. We have a note in chart from today stating that Dr. Yetta Barre office called stating pt didn't need from surgical standpoint. Please advise.

## 2011-07-10 NOTE — Telephone Encounter (Signed)
Pt informed

## 2011-07-10 NOTE — Telephone Encounter (Signed)
No refill on pain meds. Org rx post surgery.  Surgeon wants them d/c.

## 2011-07-10 NOTE — Telephone Encounter (Signed)
Dr. Marikay Alar office called and they state Dr. Yetta Barre says pt no longer needs to be on Oxycodone from a surgical standpoint

## 2011-07-16 ENCOUNTER — Other Ambulatory Visit: Payer: Self-pay | Admitting: Neurological Surgery

## 2011-07-16 DIAGNOSIS — S065X9A Traumatic subdural hemorrhage with loss of consciousness of unspecified duration, initial encounter: Secondary | ICD-10-CM

## 2011-07-19 ENCOUNTER — Ambulatory Visit: Payer: Medicare Other | Admitting: Family Medicine

## 2011-07-19 DIAGNOSIS — Z0289 Encounter for other administrative examinations: Secondary | ICD-10-CM

## 2011-07-23 ENCOUNTER — Ambulatory Visit: Payer: Medicare Other | Admitting: Family Medicine

## 2011-08-08 ENCOUNTER — Other Ambulatory Visit: Payer: Medicare Other

## 2011-08-08 ENCOUNTER — Ambulatory Visit
Admission: RE | Admit: 2011-08-08 | Discharge: 2011-08-08 | Disposition: A | Discharge status: Home / Self Care | Payer: Medicare Other | Source: Ambulatory Visit | Attending: Neurological Surgery | Admitting: Neurological Surgery

## 2011-08-08 DIAGNOSIS — S065X9A Traumatic subdural hemorrhage with loss of consciousness of unspecified duration, initial encounter: Secondary | ICD-10-CM

## 2011-08-10 ENCOUNTER — Ambulatory Visit (INDEPENDENT_AMBULATORY_CARE_PROVIDER_SITE_OTHER): Payer: Medicare Other | Admitting: Family Medicine

## 2011-08-10 ENCOUNTER — Encounter: Payer: Self-pay | Admitting: Family Medicine

## 2011-08-10 VITALS — BP 133/85 | HR 107 | Wt 153.0 lb

## 2011-08-10 DIAGNOSIS — R55 Syncope and collapse: Secondary | ICD-10-CM

## 2011-08-10 DIAGNOSIS — J449 Chronic obstructive pulmonary disease, unspecified: Secondary | ICD-10-CM

## 2011-08-10 DIAGNOSIS — F19239 Other psychoactive substance dependence with withdrawal, unspecified: Secondary | ICD-10-CM

## 2011-08-10 DIAGNOSIS — M5090 Cervical disc disorder, unspecified, unspecified cervical region: Secondary | ICD-10-CM

## 2011-08-10 DIAGNOSIS — L219 Seborrheic dermatitis, unspecified: Secondary | ICD-10-CM

## 2011-08-10 NOTE — Progress Notes (Signed)
Subjective:    Patient ID: Willie Ward, male    DOB: [birth year], 71 y.o.   MRN: [id number]  HPI Says he blacked out and went to [facility]. He did not go by EMS but had a friend drive him. Was there for 5-6 days then tranferred to [facility] rehab for 5-6 days.  Had head CT or MRI on Wednesday.  doesn' thave results. Says the neurosurgeon ordered it. Doesn't have the result syet.  Feelin rough. He says he feels tired. No worsening of his COPD or breathing. He has had some medication changes during his admission. He is no longer taking the fluoxetine. He says he doesn't tolerate it well but is unable to give me any specific side effects. He was taking it for months it and did well so i'm not sure what the changes.  Hospital records from [facility]. Evidently his chief complaint was numbness and weakness in the left side of his body. He had been having to 3 days prior to admission. He was seen on June 16. They did do a head CT at that time in the ER and it was negative. They were unable to do an MRI because of the plate in his head from his recent brain surgery. He denies any visual problems or chest pain. 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 He is also a smoker. At discharge they felt that his left-sided weakness and radicular symptoms were related to cervical disc disease. He was restarted on Neurontin which he is taking. His pain was well controlled. And he was discharged to a [facility] for rehabilitation. He was to follow with Dr. Herbert Deaner daughter regarding the MRI of his spine. 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 No intrinsic spinal cord abnormality. He also had duplex carotid Dopplers with no significant plaque or stenosis. He also had an echocardiogram with an ejection fraction of 55-60% with 1+ to 2+ mild to moderate mitral regurgitation.   Review of Systems       Objective:   Physical Exam  Constitutional: He is oriented to person, place, and time. He appears well-developed and well-nourished.  HENT:  Head: Normocephalic and atraumatic.  Cardiovascular: Normal rate, regular rhythm and normal heart sounds.   Pulmonary/Chest: Effort normal and breath sounds normal.  Neurological: He is alert and oriented to person, place, and time.  Skin: Skin is warm and dry.       Thick yellow scale on his scalp and eyebrows and nasolabial folds.  Psychiatric: He has a normal mood and affect. His behavior is normal.          Assessment & Plan:  Left-sided radiculopathy. He should be making an appointment soon with the orthopedist. He has any difficulty with this please let me know.  History of benzodiazepine withdrawal. I still want to continue to wean down his quantity of Xanax and get him completely off so that we can avoid this risk. He is no longer taking his fluoxetine. He said he doesn't tolerate antidepressants that he has taken him for months without any problems or difficulty some not sure why his opinion has changed about this so abruptly. Initially this was started to help with his anxiety so that we could reduce his Xanax.  Subdural hemorrhage-he just saw the neurosurgeon this week and had a repeat head scan. He is still awaiting the results of that  test. He wants and if I will prescribe narcotics for him. I explained to Him that I would not. This certainly increases risk of falls which she is already having frequently. I spoken with the neurosurgeon he recommended no further narcotic treatment. I did explain to him that be happy to refer him for his chronic back pain to a pain specialist if he would like.  COPD-he is stable on his current regimen.  Seborrheic dermatitis-recommend using over-the-counter Selsun Blue. If not improving and will treat with topical ketoconazole.

## 2011-08-11 LAB — LIPID PANEL
HDL: 38 mg/dL — ABNORMAL LOW (ref >39)
LDL Cholesterol: 109 mg/dL — ABNORMAL HIGH (ref 0–99)
Total CHOL/HDL Ratio: 4.5 Ratio
Triglycerides: 117 mg/dL (ref <150)
VLDL: 23 mg/dL (ref 0–40)

## 2011-08-14 ENCOUNTER — Telehealth: Payer: Self-pay | Admitting: *Deleted

## 2011-08-14 NOTE — Telephone Encounter (Signed)
Carney Bern with [facility] has called stating that the pt is due for recertification and they would like to continue with nurse visits and physical therapy. Please advise if this is ok.

## 2011-08-14 NOTE — Telephone Encounter (Signed)
Carney Bern [facility] nurse informed.

## 2011-08-14 NOTE — Telephone Encounter (Signed)
Ok to giver verbal to continue PT, nurse visits, etc.

## 2011-08-21 ENCOUNTER — Telehealth: Payer: Self-pay | Admitting: *Deleted

## 2011-08-21 MED ORDER — ALPRAZOLAM 1 MG PO TABS: 0.5000 mg | ORAL_TABLET | Freq: Two times a day (BID) | ORAL | Status: DC

## 2011-08-21 NOTE — Telephone Encounter (Signed)
Nurse with [facility] called and states pt is c/o of now with her severe back pain 7out of 10 on pain scale and was given in the past [place] by Dr. Clelia Croft. She was wanting to know what they could get for his back pain and also request a refill on his xanax. Please advise

## 2011-08-21 NOTE — Telephone Encounter (Signed)
I am not providing him any narcotics. I am happy to send him to pain management for his back. As far as the xanax I did refill with new instructions for half a tab bid (#30) as we are trying to wean down on dose slowly to get him completely off.

## 2011-08-21 NOTE — Telephone Encounter (Signed)
Physical therapist called and was informed the patient had a fall last night- he is ok just had a skin tear on left elbow that wife treated.

## 2011-08-21 NOTE — Telephone Encounter (Signed)
Nurse notified of MD instructions and she will call patient. KG LPN

## 2011-09-03 ENCOUNTER — Other Ambulatory Visit: Payer: Self-pay | Admitting: Family Medicine

## 2011-09-03 NOTE — Telephone Encounter (Signed)
Are you ok with refilling this med for this patient?

## 2011-09-05 ENCOUNTER — Telehealth: Payer: Self-pay | Admitting: *Deleted

## 2011-09-05 MED ORDER — LISINOPRIL 40 MG PO TABS: 40.0000 mg | ORAL_TABLET | Freq: Every day | ORAL | Status: DC

## 2011-09-05 NOTE — Telephone Encounter (Signed)
Nurse from [facility] called and states pt is out of his xanax and BP med .Advised we can refill med except xanax because that will not be due until 8/16 and looks like he is supposed to be being weaned off.

## 2011-09-07 ENCOUNTER — Ambulatory Visit (INDEPENDENT_AMBULATORY_CARE_PROVIDER_SITE_OTHER): Payer: Medicare Other | Admitting: Family Medicine

## 2011-09-07 ENCOUNTER — Encounter: Payer: Self-pay | Admitting: Family Medicine

## 2011-09-07 VITALS — BP 121/80 | HR 97 | Wt 157.0 lb

## 2011-09-07 DIAGNOSIS — J449 Chronic obstructive pulmonary disease, unspecified: Secondary | ICD-10-CM

## 2011-09-07 DIAGNOSIS — Z87828 Personal history of other (healed) physical injury and trauma: Secondary | ICD-10-CM

## 2011-09-07 DIAGNOSIS — F411 Generalized anxiety disorder: Secondary | ICD-10-CM

## 2011-09-07 DIAGNOSIS — R51 Headache: Secondary | ICD-10-CM

## 2011-09-07 DIAGNOSIS — Z8679 Personal history of other diseases of the circulatory system: Secondary | ICD-10-CM

## 2011-09-07 DIAGNOSIS — G40909 Epilepsy, unspecified, not intractable, without status epilepticus: Secondary | ICD-10-CM

## 2011-09-07 DIAGNOSIS — F419 Anxiety disorder, unspecified: Secondary | ICD-10-CM

## 2011-09-07 MED ORDER — GABAPENTIN 300 MG PO CAPS: 300.0000 mg | ORAL_CAPSULE | Freq: Three times a day (TID) | ORAL | Status: DC

## 2011-09-07 MED ORDER — ALPRAZOLAM 1 MG PO TABS: 0.5000 mg | ORAL_TABLET | Freq: Two times a day (BID) | ORAL | Status: DC

## 2011-09-07 MED ORDER — LEVETIRACETAM ER 500 MG PO TB24: 500.0000 mg | ORAL_TABLET | Freq: Every day | ORAL | Status: DC

## 2011-09-07 MED ORDER — PAROXETINE HCL 20 MG PO TABS: 20.0000 mg | ORAL_TABLET | ORAL | Status: DC

## 2011-09-07 MED ORDER — TIOTROPIUM BROMIDE MONOHYDRATE 18 MCG IN CAPS: 18.0000 ug | ORAL_CAPSULE | Freq: Every day | RESPIRATORY_TRACT | Status: DC

## 2011-09-07 MED ORDER — AMITRIPTYLINE HCL 100 MG PO TABS: 100.0000 mg | ORAL_TABLET | Freq: Every day | ORAL | Status: DC

## 2011-09-07 NOTE — Patient Instructions (Addendum)
Paroxetine ( Paxil).  Take half of a tab daily for one week, then increase to whole tab See me in one month.

## 2011-09-07 NOTE — Progress Notes (Signed)
  Subjective:    Patient ID: Willie Ward, male    DOB: 09/27/1940, 71 y.o.   MRN: [id number]  HPI [facility] is coming out to his house.  The nurse called saying his was extremely anxious and out of his anxiety med.   He ran out of his xanax  Out of amitryptiline as well. He should still have 30 days left of the amitriptyline. He should also have 14 days left of the Xanax. He is also out of his Keppra and gabapentin as well as his Spiriva. He said that the device wasn't working with the Spiriva.He did bring it in with him today. Says not sure if supposed to f/u With Dr. Yetta Barre or not.  Says they did a scan and thinks is supposed to f/u but not sure.  Doesn't have a neurologist.    Review of Systems     Objective:   Physical Exam  Constitutional: He is oriented to person, place, and time. He appears well-developed and well-nourished.       Sitting in a wheelchair.   HENT:  Head: Normocephalic and atraumatic.  Cardiovascular: Normal rate, regular rhythm and normal heart sounds.   Pulmonary/Chest: Effort normal and breath sounds normal.  Neurological: He is alert and oriented to person, place, and time.  Skin: Skin is warm and dry.  Psychiatric: He has a normal mood and affect. His behavior is normal.          Assessment & Plan:  Anxiety -I did discussed starting a daily medication for better control of his anxiety. He had taken fluoxetine previously but felt it wasn't working well. We will try Paxil. Follow up in one month. I did go ahead and refill his Xanax today but reminded him he is to only take half of a tab twice a day not one tab twice a day and that we will continue to wean down each month fill it until we get him completely off because his benzodiazepine use has triggered multiple seizures from withdrawals.  COPD - instructed him on how to correctly use the Spiriva device. Evidently he had not been pushing the button to actually puncture the capsule. Hopefully this  will help his COPD, f/u next month. Would like to see him back in one month. he is still smoking.  Headaches (s/p evacuation of subdural hematoma)/ Insomnia - I did go ahead and refill his amitriptyline this time but explained to him that I would not fill it early in the future. He does admit that sometimes he has taken up to the 3 tabs. I explained to him that it may not work as well as he would like every night, but I strongly encouraged him to stick with one tab.  Seizures- Will see if Dr. Yetta Barre has released him.  If so then will get him in with a Neurologist to follow his anti-seizure drugs.

## 2011-09-09 ENCOUNTER — Encounter: Payer: Self-pay | Admitting: Family Medicine

## 2011-09-18 ENCOUNTER — Telehealth: Payer: Self-pay | Admitting: *Deleted

## 2011-09-18 NOTE — Telephone Encounter (Signed)
LMOM with [facility] for social work consult.

## 2011-09-18 NOTE — Telephone Encounter (Signed)
Ok for social work referral. Needs only to take one lisinpril.

## 2011-09-18 NOTE — Telephone Encounter (Signed)
States she is d/c pt from PT today. She was calling to inform us that he fell Friday night and got skin tears on his arms. States he fell getting out of bed and he does have a night light now. She also states his BP is 152/105 and that he informed her that it runs high and then he takes 2 Lisinopril instead of the 1 ordered. States she will also be informing the RN. She would like to know if she can get an order for a SW consult. pelase advise.

## 2011-09-21 ENCOUNTER — Ambulatory Visit: Payer: Medicare Other | Admitting: Family Medicine

## 2011-10-05 ENCOUNTER — Encounter: Payer: Self-pay | Admitting: Family Medicine

## 2011-10-05 ENCOUNTER — Ambulatory Visit (INDEPENDENT_AMBULATORY_CARE_PROVIDER_SITE_OTHER): Payer: Medicare Other | Admitting: Family Medicine

## 2011-10-05 VITALS — BP 156/99 | HR 89 | Wt 155.0 lb

## 2011-10-05 DIAGNOSIS — I10 Essential (primary) hypertension: Secondary | ICD-10-CM

## 2011-10-05 DIAGNOSIS — F418 Other specified anxiety disorders: Secondary | ICD-10-CM

## 2011-10-05 DIAGNOSIS — F341 Dysthymic disorder: Secondary | ICD-10-CM

## 2011-10-05 MED ORDER — ALPRAZOLAM 0.25 MG PO TABS: 0.2500 mg | ORAL_TABLET | Freq: Two times a day (BID) | ORAL | Status: DC

## 2011-10-05 MED ORDER — PAROXETINE HCL 40 MG PO TABS: 40.0000 mg | ORAL_TABLET | ORAL | Status: DC

## 2011-10-05 MED ORDER — HYDROCHLOROTHIAZIDE 25 MG PO TABS: 25.0000 mg | ORAL_TABLET | Freq: Every day | ORAL | Status: DC

## 2011-10-05 NOTE — Progress Notes (Signed)
  Subjective:    Patient ID: Willie Ward, male    DOB: May 20, 1940, 71 y.o.   MRN: [id number]  HPI Did f/u with neuro for his seizure d/o  Anxiety/Depression - he says he did take the Paxil for about 3 weeks but then discontinued it because it wasn't helping him. He denies any side effects on the medication. He feels he is not depressed. He says he can't go on living like this. He's had insomnia for at least the last 2 nights. He spent a neurologist about possibly taking something. He discussed Ambien with him but was waiting to hear back from his office and he has not heard from them yet. He says the pharmacy would not fill the prescription for Xanax and I gave him on August 2. He thought it would then be due on the 16th but the pharmacy said it was not. Asked him when it was due and he's not sure. We had filled early last time because he had taken too many we discussed that he needed to take it as written on the bottle and not just as he decided to take it. Evidently his insurance declined to fill it early.   Review of Systems     Objective:   Physical Exam  Constitutional: He is oriented to person, place, and time. He appears well-developed and well-nourished.  HENT:  Head: Normocephalic and atraumatic.  Cardiovascular: Normal rate, regular rhythm and normal heart sounds.   Pulmonary/Chest: Effort normal and breath sounds normal.  Neurological: He is alert and oriented to person, place, and time.  Skin: Skin is warm and dry.  Psychiatric: He has a normal mood and affect. His behavior is normal.          Assessment & Plan:  HTN - uncontrolled today. Will add HCTZ to his lisinopril. Follow up in 6 weeks to recheck blood pressure. We will check a BMP at that time to make sure potassium creatinine are stable.  Axnxiety/Depression - PHQ- 9 score of 19 today. Says he really doesn't think he is depressed. Explained that I do think he is depressed and really needs to be on tx.  I would  like him to restart the Paxil and increased to 40 mg. Followup in 6 weeks. At that time we can better judge if it is really helping him or not. I explained to him again that these medications take a long time to really kick in and start working that he has to take it for longer than 3 weeks to get a fair chance. Also explained to him that we need to find something to help control his depression and anxiety before we completely wean him off the Xanax.  Due for refill on 9/27 for 0.5mg  BID.   We called [facility] .They says the rx written on 8/2 was fill on 8/28 and was picked up.  He actually had it in his bag. I told him this should last him a month.

## 2011-11-02 ENCOUNTER — Encounter: Payer: Self-pay | Admitting: Family Medicine

## 2011-11-02 ENCOUNTER — Ambulatory Visit: Payer: Medicare Other | Admitting: Physician Assistant

## 2011-11-02 ENCOUNTER — Ambulatory Visit (INDEPENDENT_AMBULATORY_CARE_PROVIDER_SITE_OTHER): Payer: Medicare Other | Admitting: Family Medicine

## 2011-11-02 VITALS — BP 112/76 | HR 105 | Wt 153.0 lb

## 2011-11-02 DIAGNOSIS — Z23 Encounter for immunization: Secondary | ICD-10-CM

## 2011-11-02 DIAGNOSIS — F411 Generalized anxiety disorder: Secondary | ICD-10-CM

## 2011-11-02 DIAGNOSIS — G4700 Insomnia, unspecified: Secondary | ICD-10-CM

## 2011-11-02 DIAGNOSIS — F419 Anxiety disorder, unspecified: Secondary | ICD-10-CM

## 2011-11-02 DIAGNOSIS — I10 Essential (primary) hypertension: Secondary | ICD-10-CM

## 2011-11-02 MED ORDER — TRAZODONE HCL 50 MG PO TABS: 50.0000 mg | ORAL_TABLET | Freq: Every day | ORAL | Status: DC

## 2011-11-02 NOTE — Progress Notes (Signed)
  Subjective:    Patient ID: Willie Ward, male    DOB: 04-19-1940, 71 y.o.   MRN: [id number]  HPI Hypertension-we added HCTZ to his lisinopril at last office visit.  Tolerating well.  No dry mouth.    Still not sleeping well. Says when doesn sleep wakes up lightheaded.  He says that weaning the Xanax is really affected his sleep.  He still feels the Paxil is not controlling his mood. He feels that not helpful but he declines any side effects. He has a refill of sertraline and fluoxetine. Again he felt it was not helpful but did not have any actual side effects. He really feels the Xanax is the only thing that helps him. But he takes indiscriminately which has led to seizures and hospitalizations so we are slowly weaning this medication away from him.   Review of Systems     Objective:   Physical Exam  Constitutional: He is oriented to person, place, and time. He appears well-developed and well-nourished.  HENT:  Head: Normocephalic and atraumatic.  Cardiovascular: Normal rate and regular rhythm.        2/6 SEM.  Pulmonary/Chest: Effort normal and breath sounds normal.  Neurological: He is alert and oriented to person, place, and time. No cranial nerve deficit.  Skin: Skin is warm and dry.  Psychiatric: He has a normal mood and affect. His behavior is normal.          Assessment & Plan:  Hypertension-check BMP today. Well controlled. At goal on hctz and lisinopril.  Continue current regimen.  Insomnia - uncontrolled.  We will restart trazodone. We had used it last year some time. I warned not to take 2 pills of Topamax. Followup in 4 weeks to make sure that he is improving as the quality is better.  Anxiety-Uncontrolled.  He has tried 3 different SSRIs with me and says that they do not help and did not work. The he has not had any negative side effects. We are waiting his Xanax because he takes them indiscriminately and then ends up having seizures with him. We are weaning him  very slowly. I would like to refer him to psychiatry to find another medication that will work for him besides Xanax to help control his anxiety.  Flu vaccine given today.

## 2011-11-03 LAB — BASIC METABOLIC PANEL WITH GFR
BUN: 20 mg/dL (ref 6–23)
CO2: 28 mEq/L (ref 19–32)
Calcium: 9.1 mg/dL (ref 8.4–10.5)
Creat: 1.15 mg/dL (ref 0.50–1.35)
GFR, Est African American: 75 mL/min
Glucose, Bld: 89 mg/dL (ref 70–99)

## 2011-11-05 ENCOUNTER — Telehealth: Payer: Self-pay | Admitting: *Deleted

## 2011-11-05 DIAGNOSIS — IMO0001 Reserved for inherently not codable concepts without codable children: Secondary | ICD-10-CM

## 2011-11-29 ENCOUNTER — Ambulatory Visit (HOSPITAL_COMMUNITY): Payer: Medicare Other | Admitting: Psychiatry

## 2011-11-30 ENCOUNTER — Ambulatory Visit (INDEPENDENT_AMBULATORY_CARE_PROVIDER_SITE_OTHER): Payer: Medicare Other | Admitting: Family Medicine

## 2011-11-30 ENCOUNTER — Encounter: Payer: Self-pay | Admitting: Family Medicine

## 2011-11-30 VITALS — BP 200/110 | HR 119 | Wt 154.0 lb

## 2011-11-30 DIAGNOSIS — F411 Generalized anxiety disorder: Secondary | ICD-10-CM

## 2011-11-30 DIAGNOSIS — M542 Cervicalgia: Secondary | ICD-10-CM

## 2011-11-30 DIAGNOSIS — I10 Essential (primary) hypertension: Secondary | ICD-10-CM

## 2011-11-30 DIAGNOSIS — G8929 Other chronic pain: Secondary | ICD-10-CM

## 2011-11-30 DIAGNOSIS — G4700 Insomnia, unspecified: Secondary | ICD-10-CM

## 2011-11-30 MED ORDER — TRAZODONE HCL 100 MG PO TABS: 100.0000 mg | ORAL_TABLET | Freq: Every day | ORAL | Status: DC

## 2011-11-30 MED ORDER — PAROXETINE HCL 20 MG PO TABS: 20.0000 mg | ORAL_TABLET | ORAL | Status: DC

## 2011-11-30 MED ORDER — GABAPENTIN 400 MG PO CAPS: 400.0000 mg | ORAL_CAPSULE | Freq: Three times a day (TID) | ORAL | Status: DC

## 2011-11-30 MED ORDER — TRAMADOL HCL 50 MG PO TABS: 50.0000 mg | ORAL_TABLET | Freq: Two times a day (BID) | ORAL | Status: DC | PRN

## 2011-11-30 NOTE — Progress Notes (Signed)
  Subjective:    Patient ID: Willie Ward, male    DOB: 01-18-1941, 71 y.o.   MRN: [id number]  HPI Today to followup on hypertension-he had an antiviral of hydrochlorothiazide and that he needed refills on it. He says he wasn't sure what was forwarded he should continue to take it or not. He says he was taking his lisinopril regularly. His blood pressures quite elevated today. No other changes. No chest pain or short of breath.  Generalized anxiety disorder-we tried several different SSRIs. He has been cutting the Paxil in half. And he says he is having a lot of difficulty with this. He says he really didn't feel that different on the 40 mg, whole tab dosing. We'll try to refer him to psychiatry but he got confused and thoghtt that Dr. Baron Sane was  a neurosurgeon that he had seen previously. So he canceled the appointment.  He would like to have tramadol for his chronic pain. He says he was given a prescription for oxycodone from his dentist for tooth extractions it worked great for his neck and back pain. The he has taken tramadol before and says it helps as well.  Insomnia-I gave him a prescription for trazodone at the last visit. It was 50 mg and he can take 1-2 tabs. Says 100 mg worked the best. He did try taking 200 mg and says that worked well as well. Review of Systems     Objective:   Physical Exam  Constitutional: He is oriented to person, place, and time. He appears well-developed and well-nourished.  HENT:  Head: Normocephalic and atraumatic.  Cardiovascular: Normal rate, regular rhythm and normal heart sounds.        No carotid bruits  Pulmonary/Chest: Effort normal and breath sounds normal.  Musculoskeletal: He exhibits no edema.  Neurological: He is alert and oriented to person, place, and time.  Skin: Skin is warm and dry.  Psychiatric: He has a normal mood and affect. His behavior is normal.          Assessment & Plan:  Hypertension-uncontrolled today. I'm not  sure why it is so high. He says is feeling well he is not having any chest pain shortness of breath or dizziness. He has not taken his HCTZ and is completely out. Prescription sent to pharmacy. I explained to him it's important to get both medications control his blood pressure. Also I strongly encouraged her to pharmacist to have an extra labels on his bottles to say what they're for. Actually think this would be extremely helpful for him.  Generalized anxiety disorder-Uncontrolled. he canceled his appointment with Dr. Baron Sane downstairs. He will reschedule this on his way out. I expressed to him again that I think he needs a psychiatrist to better control his anxiety for now I will change his Paxil to the 20 mg dose so he doesn't have to split them. I'm still not very clear why he wasn't taking a whole tab except for the fact that he felt it wasn't working any different than the 20 mg dose. He is completely weaned off his Xanax which is fantastic. He is not a good candidate for this medication because he takes and appropriately and gets withdrawal seizures.  Insomnia-Uncontrolled.  increase his trazodone 200 mg. He can take 1-2 tabs at bedtime as needed.  Chronic neck and back pain-I. did give him tramadol up to 2 tabs daily, for his chronic pain. We'll need to monitor his use and refills carefully.

## 2011-12-03 ENCOUNTER — Ambulatory Visit (INDEPENDENT_AMBULATORY_CARE_PROVIDER_SITE_OTHER): Payer: Medicare Other | Admitting: Psychiatry

## 2011-12-03 ENCOUNTER — Encounter (HOSPITAL_COMMUNITY): Payer: Self-pay | Admitting: Psychiatry

## 2011-12-03 VITALS — BP 131/89 | HR 98 | Ht 68.75 in | Wt 158.0 lb

## 2011-12-03 DIAGNOSIS — F1020 Alcohol dependence, uncomplicated: Secondary | ICD-10-CM
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
## 2011-12-11 ENCOUNTER — Telehealth: Payer: Self-pay | Admitting: *Deleted

## 2011-12-11 NOTE — Telephone Encounter (Signed)
Declined for hospital bed.

## 2011-12-11 NOTE — Telephone Encounter (Signed)
[employer] Case manager called and wanted a order for Hospital Bed  States patient keeps falling Wallie Char, RN @ [phone] ext [phone]

## 2011-12-13 ENCOUNTER — Encounter: Payer: Self-pay | Admitting: Family Medicine

## 2011-12-13 ENCOUNTER — Ambulatory Visit (INDEPENDENT_AMBULATORY_CARE_PROVIDER_SITE_OTHER): Payer: Medicare Other | Admitting: Family Medicine

## 2011-12-13 VITALS — BP 166/85 | HR 71 | Wt 150.0 lb

## 2011-12-13 DIAGNOSIS — S0990XA Unspecified injury of head, initial encounter: Secondary | ICD-10-CM

## 2011-12-13 DIAGNOSIS — R296 Repeated falls: Secondary | ICD-10-CM

## 2011-12-13 DIAGNOSIS — W06XXXA Fall from bed, initial encounter: Secondary | ICD-10-CM

## 2011-12-13 DIAGNOSIS — R51 Headache: Secondary | ICD-10-CM

## 2011-12-13 DIAGNOSIS — M542 Cervicalgia: Secondary | ICD-10-CM

## 2011-12-13 MED ORDER — AMBULATORY NON FORMULARY MEDICATION: Status: DC

## 2011-12-13 NOTE — Progress Notes (Signed)
Subjective:    Patient ID: Willie Ward, male    DOB: [birth year], 71 y.o.   MRN: [id number]  HPI Hx of falls about 1-2 every month. Last fall was Tuesday and he rolled out of bed. Wants a hospital bed bc of the rails.  Says sometimes falls or stumbles getting into bed.  Has removed rugs and glass table pre nurse rec through home health. Says has bilateal neck pain since he fell out of bed on Tuesday.  C/o ice pick sensation.  Pain 8/10.  Says a couple of "shots of liquor" help.  HA intermittantly. Will warp a towel around his head and says it will help.  Some pain in his upper back as well. Hx of insomnia. See last note.  Still smoking 1.5 days. Has pressure over the right forntal head since feel out of bed and hit the back of his head.  He has a chronic history of gait abnormality. Unfortunately this is secondary to his alcoholism and a previous subdural hematoma that required evacuation.  Continue to have back pain. Hx of surgery L4-5.  Back pain has been stable.    Insomnia - Says the trazodone is helping some.  Wants to go back on Ambien.    Review of Systems     Objective:   Physical Exam  Constitutional: He is oriented to person, place, and time. He appears well-developed and well-nourished.  HENT:  Head: Normocephalic and atraumatic.       There are no contusions on the head.  Eyes: Conjunctivae normal and EOM are normal. Pupils are equal, round, and reactive to light.  Neck: Neck supple. No JVD present. No tracheal deviation present. No thyromegaly present.  Cardiovascular: Normal rate, regular rhythm and normal heart sounds.   Pulmonary/Chest: Effort normal and breath sounds normal.  Musculoskeletal:       Neck with normal flexion, extension. Decreased rotation to the right compared to left. Normal side bending. He is tender over both upper trapezius muscles. He is nontender over the actual cervical spine.  Lymphadenopathy:    He has no cervical adenopathy.  Neurological: He  is alert and oriented to person, place, and time.  Skin: Skin is warm and dry.  Psychiatric: He has a normal mood and affect. His behavior is normal.          Assessment & Plan:  Frequent falls - would like to get an x-ray of his cervical spine as well as a CT of the head because he has had a prior subdural hematoma. I am concerned he is having frontal headaches after hitting his head posteriorly near the occiput. Call if headache suddenly gets worse or becomes nauseated or vomits. I will also write for him to get a hospital bed. [facility]. I do think having the rails would assist him and keep him from falling out of the bed at night.  HA- see note above under frequent falls.  Neck Pain- we will get x-ray since he has had neck pain since the fall. I suspect this is most likely muscular skeletal chest strain. Can continue warm compresses as needed. He can use Tylenol and his tramadol as needed. Followup in 2 weeks.  Hypertension-not well controlled today. He says he is taking his medications related. Would like to recheck this in 2 weeks.  Alcohol abuse-we also discussed the need for cessation. He says he will try but makes no promises. Him that this increases  his risk of falls and may be contributing to his blood pressure and discomfort.

## 2011-12-14 ENCOUNTER — Telehealth: Payer: Self-pay | Admitting: *Deleted

## 2011-12-14 ENCOUNTER — Ambulatory Visit (HOSPITAL_BASED_OUTPATIENT_CLINIC_OR_DEPARTMENT_OTHER): Payer: Medicare Other

## 2011-12-14 NOTE — Telephone Encounter (Signed)
Pt calls this morning and states he is gonna cancel the CT scan because they called him last night and wanted him to pay portion of bill up front and he says he can't do that right now financially but will go and have xray of neck still. Informed pt I would let you know of his decision

## 2011-12-14 NOTE — Telephone Encounter (Signed)
Willie Ward with [facility] calls and states per Medicare guidelines they need letter stating clinical documentation for the need for the hospital bed. Fax to [phone]

## 2011-12-18 ENCOUNTER — Ambulatory Visit (INDEPENDENT_AMBULATORY_CARE_PROVIDER_SITE_OTHER): Payer: Medicare Other

## 2011-12-18 DIAGNOSIS — M542 Cervicalgia: Secondary | ICD-10-CM

## 2011-12-19 ENCOUNTER — Telehealth: Payer: Self-pay | Admitting: *Deleted

## 2011-12-19 NOTE — Telephone Encounter (Signed)
Message copied by Florestine Avers on Wed Dec 19, 2011  2:13 PM ------      Message from: Nani Gasser D      Created: Wed Dec 19, 2011  2:10 PM       Call patient: No acute fracture in his neck. There is signs of an old fracture at C2. 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 If he is continuing to have pain in his neck that I would like him to consider seeing either Dr. Benjamin Stain for further evaluation and treatment.

## 2011-12-19 NOTE — Telephone Encounter (Signed)
Patient aware of X-ray results and would like to make an appointment with Dr.T here in our office

## 2011-12-25 ENCOUNTER — Telehealth: Payer: Self-pay | Admitting: *Deleted

## 2011-12-25 DIAGNOSIS — R296 Repeated falls: Secondary | ICD-10-CM

## 2011-12-25 MED ORDER — AMBULATORY NON FORMULARY MEDICATION: Status: DC

## 2011-12-25 NOTE — Telephone Encounter (Signed)
Pt notified to come pick up order

## 2011-12-25 NOTE — Telephone Encounter (Signed)
Reprinted new prescription.

## 2011-12-25 NOTE — Telephone Encounter (Signed)
Case manager with [facility] for Sheryl calls and LM that his DME company denied the hospital bed. Said if resubmitted the order with diagnosis of Generalized weakness with history of multiple falls and his COPD that it would be approved. Call Jillyn Hidden when ready and he will have to take to DME company

## 2011-12-27 ENCOUNTER — Encounter: Payer: Self-pay | Admitting: Family Medicine

## 2011-12-27 ENCOUNTER — Ambulatory Visit (INDEPENDENT_AMBULATORY_CARE_PROVIDER_SITE_OTHER): Payer: Medicare Other | Admitting: Family Medicine

## 2011-12-27 ENCOUNTER — Telehealth: Payer: Self-pay | Admitting: *Deleted

## 2011-12-27 VITALS — BP 153/90 | HR 114 | Wt 158.0 lb

## 2011-12-27 DIAGNOSIS — M542 Cervicalgia: Secondary | ICD-10-CM

## 2011-12-27 DIAGNOSIS — I10 Essential (primary) hypertension: Secondary | ICD-10-CM

## 2011-12-27 DIAGNOSIS — J449 Chronic obstructive pulmonary disease, unspecified: Secondary | ICD-10-CM

## 2011-12-27 DIAGNOSIS — Z9181 History of falling: Secondary | ICD-10-CM

## 2011-12-27 DIAGNOSIS — Z87898 Personal history of other specified conditions: Secondary | ICD-10-CM

## 2011-12-27 MED ORDER — METOPROLOL SUCCINATE ER 25 MG PO TB24: 25.0000 mg | ORAL_TABLET | Freq: Every day | ORAL | Status: DC

## 2011-12-27 NOTE — Addendum Note (Signed)
Addended by: Nani Gasser D on: 12/27/2011 08:47 PM   Modules accepted: Level of Service

## 2011-12-27 NOTE — Patient Instructions (Addendum)
Recommend apply heat for 10-15 min  Twice a day Work on stretches given on the handout We will hold off on your CT of your head for now. Let me know if you headaches get worse Keep consult appointment with Dr. Benjamin Stain. We are adding metoprolol for better control your blood pressure

## 2011-12-27 NOTE — Telephone Encounter (Signed)
Let's fax todays note and reprint rx ( rx has dx on it). They told patient they never got new rx and we reprinted rx earlier this week.

## 2011-12-27 NOTE — Progress Notes (Addendum)
  Subjective:    Patient ID: Willie Ward, male    DOB: 1940-03-24, 71 y.o.   MRN: [id number]  HPI STILL HAVING NECK PAIN AFTER fell out of bed 3 weeks ago. Ssys his HA are better overall but still has sig neck pain.  Did xray and no acute fracture. We have scheduled him for a CT of the head and prior authorized but he never went. He wants and if he still needs to go. He says his neck is especially stiff in the morning and takes a while before he is able to get a more full range of motion. It hurts over both trapezius muscles which is where he points today. He does have tramadol for pain relief.   HTN- He is out of his meds. No CP or SOB. Says he is taking meds regularly.    Ran out of his seizure pills and hasn't taken them for a few week.  He wants and if he needs to continue them at this time.    Review of Systems     Objective:   Physical Exam  Constitutional: He is oriented to person, place, and time. He appears well-developed and well-nourished.  HENT:  Head: Normocephalic and atraumatic.  Cardiovascular: Normal rate, regular rhythm and normal heart sounds.   Pulmonary/Chest: Effort normal and breath sounds normal.  Musculoskeletal:       Neck with normal flexion. Dec extension. Pain with rotation to the left.  Normal side bending.   Neurological: He is alert and oriented to person, place, and time.  Skin: Skin is warm and dry.  Psychiatric: He has a normal mood and affect. His behavior is normal.          Assessment & Plan:  HTN- Uncontrolled.  Will add metoprolol. F/U in 1 month.    NEck pain - he does have some arthritis in his neck as well as Ms. likely strain from a fall out of bed. No acute fractures sure which is reassuring. This point I think he would benefit greatly from physical therapy. He says ER he hasn't with Dr. Benjamin Stain in about 2 weeks. He would prefer to have some exercises to do on his own at home first. Given handout with some stretches to do a  home. Also recommend warm heat for 10-15 minutes twice a day and gentle stretches. If she's not feeling significantly better then consider more formal physical therapy. Continue tramadol as needed for pain. I'm hesitant to give him anti-inflammatories at this point because of his alcoholism and increase risk of GI ulceration.  Continue gabapentin as well.  History of multiple seizures secondary to withdrawal from benzodiazepines-since we have now completely when to come off of benzodiazepines I think it is safe for him to discontinue his antiseizure medications.  History of multiple falls. I do think he would benefit from a hospital bed for safety because of falls out of the bed and for better ease of repositioning.  He has chronic low back pain in addition to COPD and malnutrition which puts him at high risk of ulcers.  He also has neuropathy.

## 2011-12-27 NOTE — Telephone Encounter (Signed)
Natalie with [facility] states that per Medicare guidelines they need a script for the hospital bed with diagnosis on it. Then they need separate clinical documentation stating why the patient needs a hospital bed versus a regular bed. Such as easier to reposition self in hospital bed, the head of bed needs to be raised up 30 degrees, etc.. She states she is sorry for the confusion but Medicare is very strict on these guidelines

## 2011-12-28 NOTE — Telephone Encounter (Signed)
Faxed records and new rx back to [facility] with [facility]

## 2012-01-01 ENCOUNTER — Ambulatory Visit (INDEPENDENT_AMBULATORY_CARE_PROVIDER_SITE_OTHER): Payer: Medicare Other | Admitting: Psychiatry

## 2012-01-01 ENCOUNTER — Encounter (HOSPITAL_COMMUNITY): Payer: Self-pay | Admitting: Psychiatry

## 2012-01-01 VITALS — BP 115/73 | HR 71 | Ht 68.75 in | Wt 157.0 lb

## 2012-01-01 DIAGNOSIS — F411 Generalized anxiety disorder: Secondary | ICD-10-CM

## 2012-01-01 DIAGNOSIS — F1020 Alcohol dependence, uncomplicated: Secondary | ICD-10-CM
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
## 2012-01-06 DIAGNOSIS — S62502A Fracture of unspecified phalanx of left thumb, initial encounter for closed fracture: Secondary | ICD-10-CM

## 2012-01-06 HISTORY — DX: Fracture of unspecified phalanx of left thumb, initial encounter for closed fracture: S62.502A

## 2012-01-07 ENCOUNTER — Telehealth: Payer: Self-pay | Admitting: *Deleted

## 2012-01-07 ENCOUNTER — Ambulatory Visit (INDEPENDENT_AMBULATORY_CARE_PROVIDER_SITE_OTHER): Payer: Medicare Other | Admitting: Sports Medicine

## 2012-01-07 ENCOUNTER — Ambulatory Visit (INDEPENDENT_AMBULATORY_CARE_PROVIDER_SITE_OTHER): Payer: Medicare Other

## 2012-01-07 ENCOUNTER — Encounter: Payer: Self-pay | Admitting: Sports Medicine

## 2012-01-07 VITALS — BP 150/89 | HR 65 | Wt 152.0 lb

## 2012-01-07 DIAGNOSIS — M5090 Cervical disc disorder, unspecified, unspecified cervical region: Secondary | ICD-10-CM

## 2012-01-07 DIAGNOSIS — W19XXXA Unspecified fall, initial encounter: Secondary | ICD-10-CM

## 2012-01-07 DIAGNOSIS — M7989 Other specified soft tissue disorders: Secondary | ICD-10-CM

## 2012-01-07 DIAGNOSIS — S62502A Fracture of unspecified phalanx of left thumb, initial encounter for closed fracture: Secondary | ICD-10-CM | POA: Insufficient documentation

## 2012-01-07 DIAGNOSIS — S62639A Displaced fracture of distal phalanx of unspecified finger, initial encounter for closed fracture: Secondary | ICD-10-CM

## 2012-01-07 MED ORDER — CYCLOBENZAPRINE HCL 10 MG PO TABS: ORAL_TABLET | ORAL | Status: DC

## 2012-01-07 MED ORDER — GABAPENTIN 300 MG PO CAPS: ORAL_CAPSULE | ORAL | Status: DC

## 2012-01-07 NOTE — Telephone Encounter (Signed)
Pharmacist calls and has questions again on the Gabapentin that you were discussing with them.

## 2012-01-07 NOTE — Telephone Encounter (Signed)
Spoke with pharmacy and informed pt to p/u rx.

## 2012-01-07 NOTE — Progress Notes (Signed)
SPORTS MEDICINE CONSULTATION REPORT  Subjective:    I'm seeing this patient as a consultation for:  Dr. Linford Arnold  CC: Neck pain  HPI:  Willie Ward is a very pleasant 71 year old male who comes in with a several year history of pain he localizes in the left side of his posterior neck. The pain does radiate down both arms, causing numbness and tingling into all of his fingers, he also notes pain reproduced with extension and left rotation of his neck.  He denies any recent trauma to his neck. The pain is severe, he has been on tramadol, which is ineffective for his pain. He has already had physical therapy, approximately 3 months in the past. He has never had any type of interventional injection.  Left thumb pain: Occurred after a recent fall, the thumb is swollen, and bruised with pain at the distal phalanx. The pain is localized, does not radiate, it is severe.  Past medical history, Surgical history, Family history, Social history, Allergies, and medications have been entered into the medical record, reviewed, and no changes needed.   Review of Systems: No headache, visual changes, nausea, vomiting, diarrhea, constipation, dizziness, abdominal pain, skin rash, fevers, chills, night sweats, weight loss, swollen lymph nodes, body aches, joint swelling, muscle aches, chest pain, shortness of breath, mood changes, visual or auditory hallucinations.   Objective:   Vitals:  Afebrile, vital signs stable. General: Well Developed, well nourished, and in no acute distress.  Neuro/Psych: Alert and oriented x3, extra-ocular muscles intact, able to move all 4 extremities.  Skin: Warm and dry, no rashes noted.  Respiratory: Not using accessory muscles, speaking in full sentences, trachea midline.  Cardiovascular: Pulses palpable, no extremity edema. Abdomen: Does not appear distended. Neck: Inspection unremarkable. No palpable stepoffs. Negative Spurling's maneuver. Full neck range of motion Grip strength  and sensation normal in bilateral hands Strength good C4 to T1 distribution No sensory change to C4 to T1 Negative Hoffman sign bilaterally Reflexes normal  Left thumb is swollen and bruised, there's tenderness to palpation over the distal phalanx. He has limited range of motion secondary to swelling. He does have good strength to extension of the interphalangeal joint, as well as at the metacarpal phalangeal joint.  I did obtain x-rays of his thumb, they show a nondisplaced fracture through the distal phalanx that is extra-articular.  I also reviewed x-rays of the cervical spine that showed severe degenerative disease at the C5-6 and C6-7 levels. He also has multilevel facet arthrosis.   He also has an MRI that shows severe degenerative disc disease with loss of height and uncovertebral spurring at the C5-6 and C6-C7 level causing bilateral foraminal stenosis at both levels, but no central canal stenosis. There is also multilevel facet arthrosis.  Impression and Recommendations:   This case required medical decision making of moderate complexity.

## 2012-01-07 NOTE — Assessment & Plan Note (Addendum)
Occurred recently after a fall. There is some bruising, but I do suspect there is a fracture. All ligamentous structures are intact, and is exquisitely tender over the distal phalanx. We will x-ray the thumb.  X-ray show a fracture in the distal phalanx.  The patient was brought back, and a stack splint was placed.  I billed a fracture code for this visit, all subsequent visits for this complaint will be "post-op checks" in the global period.

## 2012-01-07 NOTE — Assessment & Plan Note (Signed)
Multilevel degenerative disc disease with bilateral foraminal stenosis worse at C5-6 and C6-7 on x-ray and MRI. He has failed conservative therapy in the form of physical therapy, and oral medications. I would like him to see Dr. Oneal Grout for consideration of interlaminar steroid injection at the C6-C7 level. I'm also going to increase his gabapentin and add Flexeril at bedtime.  I do understand that he does have a high fall risk, he will start these medicines at a low dose and increase slowly. I would like to see him back after the injection to see how symptoms are doing.

## 2012-01-21 ENCOUNTER — Ambulatory Visit: Payer: Medicare Other | Admitting: Family Medicine

## 2012-01-21 ENCOUNTER — Encounter: Payer: Self-pay | Admitting: Sports Medicine

## 2012-01-21 ENCOUNTER — Ambulatory Visit (INDEPENDENT_AMBULATORY_CARE_PROVIDER_SITE_OTHER): Payer: Medicare Other | Admitting: Sports Medicine

## 2012-01-21 VITALS — BP 144/89 | HR 71 | Wt 154.0 lb

## 2012-01-21 DIAGNOSIS — M7989 Other specified soft tissue disorders: Secondary | ICD-10-CM

## 2012-01-21 DIAGNOSIS — M5090 Cervical disc disorder, unspecified, unspecified cervical region: Secondary | ICD-10-CM

## 2012-01-21 MED ORDER — CYCLOBENZAPRINE HCL 10 MG PO TABS: 10.0000 mg | ORAL_TABLET | Freq: Three times a day (TID) | ORAL | Status: DC | PRN

## 2012-01-21 NOTE — Progress Notes (Signed)
SPORTS MEDICINE CONSULTATION REPORT  Subjective:    CC: Followup  HPI: Fracture: Willie Ward is now about 2 weeks into mobilization. He is wearing the stack splint. Unfortunately he does tend to slide off of his hand despite taping. Overall his pain is significantly better.  Cervical radiculitis: Bilateral, we are still awaiting interventional spine referral. He does have an appointment set up for February 2014. Pain is localized in the neck, and radiates down both arms.  Past medical history, Surgical history, Family history, Social history, Allergies, and medications have been entered into the medical record, reviewed, and no changes needed.   Review of Systems: No headache, visual changes, nausea, vomiting, diarrhea, constipation, dizziness, abdominal pain, skin rash, fevers, chills, night sweats, weight loss, swollen lymph nodes, body aches, joint swelling, muscle aches, chest pain, shortness of breath, mood changes, visual or auditory hallucinations.   Objective:   Vitals:  Afebrile, vital signs stable. General: Well Developed, well nourished, and in no acute distress.  Neuro/Psych: Alert and oriented x3, extra-ocular muscles intact, able to move all 4 extremities.  Skin: Warm and dry, no rashes noted.  Respiratory: Not using accessory muscles, speaking in full sentences, trachea midline.  Cardiovascular: Pulses palpable, no extremity edema. Abdomen: Does not appear distended. Thumb is still swollen, but significantly improved, bruising is resolving, he has excellent motion to extension and flexion at the interphalangeal as well as the metacarpophalangeal joints. There's only mild tenderness to palpation over the distal phalanx.  Impression and Recommendations:   This case required medical decision making of moderate complexity.

## 2012-01-21 NOTE — Assessment & Plan Note (Signed)
Still awaiting interlaminar injection. He has an appointment scheduled for February 2014. He will come back to see me after the injections.  I did offer a referral to [facility] imaging to have this done sooner, but he declines as he does not like to leave [place].

## 2012-01-21 NOTE — Assessment & Plan Note (Signed)
Replaced brace. This was wrapped more extensively to prevent it from falling off. I will re\re x-ray, and he will come back to see me in 2 weeks.

## 2012-01-21 NOTE — Addendum Note (Signed)
Addended by: Monica Becton on: 01/21/2012 01:59 PM   Modules accepted: Orders

## 2012-01-22 ENCOUNTER — Ambulatory Visit (INDEPENDENT_AMBULATORY_CARE_PROVIDER_SITE_OTHER): Payer: Medicare Other

## 2012-01-22 DIAGNOSIS — Z09 Encounter for follow-up examination after completed treatment for conditions other than malignant neoplasm: Secondary | ICD-10-CM

## 2012-01-23 ENCOUNTER — Telehealth (HOSPITAL_COMMUNITY): Payer: Self-pay | Admitting: Psychiatry

## 2012-01-23 NOTE — Telephone Encounter (Signed)
Called patient, he reports periodic foot drop on the right side. Asked patient to tell his PCP about this issue.  He has an appointment tomorrow.

## 2012-01-24 ENCOUNTER — Other Ambulatory Visit: Payer: Self-pay | Admitting: Family Medicine

## 2012-01-24 ENCOUNTER — Encounter: Payer: Self-pay | Admitting: Family Medicine

## 2012-01-24 ENCOUNTER — Ambulatory Visit (INDEPENDENT_AMBULATORY_CARE_PROVIDER_SITE_OTHER): Payer: Medicare Other | Admitting: Family Medicine

## 2012-01-24 ENCOUNTER — Ambulatory Visit: Payer: Self-pay

## 2012-01-24 VITALS — BP 172/98 | HR 72 | Ht 71.0 in | Wt 160.0 lb

## 2012-01-24 DIAGNOSIS — M216X9 Other acquired deformities of unspecified foot: Secondary | ICD-10-CM

## 2012-01-24 DIAGNOSIS — I872 Venous insufficiency (chronic) (peripheral): Secondary | ICD-10-CM

## 2012-01-24 DIAGNOSIS — I10 Essential (primary) hypertension: Secondary | ICD-10-CM

## 2012-01-24 DIAGNOSIS — M21371 Foot drop, right foot: Secondary | ICD-10-CM

## 2012-01-24 DIAGNOSIS — R21 Rash and other nonspecific skin eruption: Secondary | ICD-10-CM

## 2012-01-24 DIAGNOSIS — L738 Other specified follicular disorders: Secondary | ICD-10-CM

## 2012-01-24 DIAGNOSIS — L853 Xerosis cutis: Secondary | ICD-10-CM

## 2012-01-24 LAB — COMPLETE METABOLIC PANEL WITH GFR
ALT: <8 U/L (ref 0–53)
Alkaline Phosphatase: 74 U/L (ref 39–117)
GFR, Est Non African American: 59 mL/min — ABNORMAL LOW
Sodium: 131 mEq/L — ABNORMAL LOW (ref 135–145)
Total Bilirubin: 0.5 mg/dL (ref 0.3–1.2)
Total Protein: 6.4 g/dL (ref 6.0–8.3)

## 2012-01-24 LAB — TSH: TSH: 1.309 u[IU]/mL (ref 0.350–4.500)

## 2012-01-24 MED ORDER — METOPROLOL SUCCINATE ER 50 MG PO TB24: 50.0000 mg | ORAL_TABLET | Freq: Every day | ORAL | Status: DC

## 2012-01-24 NOTE — Progress Notes (Signed)
Subjective:    Patient ID: Willie Ward, male    DOB: [birth year], 71 y.o.   MRN: [id number]  HPI Here for followup blood pressure. He admits he's been eating a lot of salt lately. He did realize that this would increase his blood pressure.He is feeling better on the metoprolol. No chest pain or short of breath.  Rash on right leg and foot. Says has been there for months. Says it is actually getting better.  Says the rash is not gone from his foot and is just on his leg now. No itching or pain.  He has very dry skin.  He doesn't use any lotion. He's also noticed that he is unable to dorsiflex his foot very well on the right for the last 3 weeks maybe even 4 weeks. He denies any trauma or actual injury. He says he has chronic low back pain. He also reports that he had a prior history of lumbar surgery back in the 1980s. He never mentioned this previously. He said they operated on L4 and L5. He is never had foot drop before. No significant pain, tingling or numbness.   Review of Systems BP 172/98  Pulse 72  Ht 5\' 11"  (1.803 m)  Wt 160 lb (72.576 kg)  BMI 22.32 kg/m2    Allergies  Allergen Reactions  . Benzodiazepines Other (See Comments)    Repeated history of taking them inappropriately and having occurrences of withdrawal seizures.  . Tape Other (See Comments)    Peels skin    Past Medical History  Diagnosis Date  . Chronic back pain   . Prostate cancer   . Depression   . COPD (chronic obstructive pulmonary disease)   . Shortness of breath   . Hypertension   . Stroke   . Seizures     from withdrawal from Benzos    Past Surgical History  Procedure Date  . Cataract extraction, bilateral 02/2011    Dr. Lavona Mound.   . Craniotomy 05/31/2011    Procedure: CRANIOTOMY HEMATOMA EVACUATION SUBDURAL;  Surgeon: Tia Alert, MD;  Location: [facility];  Service: Neurosurgery;  Laterality: Right;  Right Frontal Craniotomy for Evacuation of Subdural Hematoma  . Lumbar spine surgery 1980s     L4-L5    History   Social History  . Marital Status: Single    Spouse Name: N/A    Number of Children: N/A  . Years of Education: GED   Occupational History  . Disabled    Social History Main Topics  . Smoking status: Current Every Day Smoker -- 1.8 packs/day    Types: Cigarettes    Last Attempt to Quit: 08/20/2010  . Smokeless tobacco: Not on file  . Alcohol Use: 21.0 oz/week    35 Cans of beer per week     Comment: Drinking about 5 beers a day  . Drug Use: 1 per week    Special: Marijuana  . Sexually Active: No   Other Topics Concern  . Not on file   Social History Narrative   1 caffeine drink per day. No exercise. Former alcoholic    Family History  Problem Relation Age of Onset  . Heart attack Mother   . Depression Son   . Diabetes Sister   . Hypertension Mother   . Hypertension Brother     Outpatient Encounter Prescriptions as of 01/24/2012  Medication Sig Dispense Refill  . albuterol (PROVENTIL,VENTOLIN) 90 MCG/ACT inhaler Inhale 2 puffs into the lungs every 6 (six)  hours as needed.      . AMBULATORY NON FORMULARY MEDICATION Medication Name:  hospital bed. Dx:  Generalized weakness with history of multiple falls and COPD  1 Units  0  . aspirin 81 MG tablet Take 81 mg by mouth daily.      . cyclobenzaprine (FLEXERIL) 10 MG tablet Take 1 tablet (10 mg total) by mouth 3 (three) times daily as needed for muscle spasms.  90 tablet  0  . folic acid (FOLVITE) 1 MG tablet Take 1 mg by mouth daily.      Marland Kitchen gabapentin (NEURONTIN) 300 MG capsule One tab PO qHS for a week, then BID for a week, then TID.  May increase to a max of 4 tabs PO TID.  180 capsule  3  . hydrochlorothiazide (HYDRODIURIL) 25 MG tablet Take 1 tablet (25 mg total) by mouth daily.  30 tablet  11  . lisinopril (PRINIVIL,ZESTRIL) 40 MG tablet Take 1 tablet (40 mg total) by mouth daily.  30 tablet  6  . metoprolol succinate (TOPROL-XL) 50 MG 24 hr tablet Take 1 tablet (50 mg total) by mouth daily.   30 tablet  2  . Multiple Vitamin (MULTIVITAMIN) tablet Take 1 tablet by mouth daily.      Marland Kitchen PARoxetine (PAXIL) 20 MG tablet Take 1 tablet (20 mg total) by mouth every morning.  30 tablet  3  . tiotropium (SPIRIVA HANDIHALER) 18 MCG inhalation capsule Place 1 capsule (18 mcg total) into inhaler and inhale daily.  30 capsule  6  . traMADol (ULTRAM) 50 MG tablet Take 1 tablet (50 mg total) by mouth 2 (two) times daily as needed for pain.  60 tablet  3  . traZODone (DESYREL) 100 MG tablet Take 100-200 mg by mouth at bedtime.      . [DISCONTINUED] metoprolol succinate (TOPROL-XL) 25 MG 24 hr tablet Take 1 tablet (25 mg total) by mouth daily.  30 tablet  2          Objective:   Physical Exam  Constitutional: He is oriented to person, place, and time. He appears well-developed and well-nourished.  HENT:  Head: Normocephalic and atraumatic.  Cardiovascular: Normal rate, regular rhythm and normal heart sounds.   Pulmonary/Chest: Effort normal and breath sounds normal.  Musculoskeletal:       He is unable to completely dorsiflex his right foot. He has good strength with bilateral flexion inversion, and eversion, knee strength is 5 out of 5.  He does have decreased sensation with gross touch on both lower extremities. Low back in nontender.   Neurological: He is alert and oriented to person, place, and time.  Skin: Skin is warm and dry.  Psychiatric: He has a normal mood and affect. His behavior is normal.     He does have a rash on his right lower extremity. Does not extend past the ankle or onto the foot. It is a dark red and purple in appearance. It is consistent with venous stasis dermatitis. The skin is also dry and scaling. There is no actual open wounds or cracking or drainage or surrounding cellulitis or erythema.     Assessment & Plan:  Hypertension-handout information given on the DASH diet.Increase metoprolol to 50mg . followup in one month. Reminded him again to avoid salt. There are  also some salt substitutes over-the-counter if he feels he really just needs to add something to his food.  Right foot drop- new onset about 3-4 weeks ago. He feels like this started  before his recent fall where he injured his thumb. He does have a prior history of surgeries or certainly there could be an issue there. We'll start with a lumbar x-ray. Will most likely need to move to an MRI with contrast for further evaluation especially because he has postsurgical changes. He denies having any hardware in his lumbar spine. He is not having any significant pain at this time. Could be from disc impression versus pregnancy. I did look in the old chart to see if there were any old films of the lumbar spine and could not find any looking back to 2010.  Rash-appears to be consistent with stasis dermatitis. I would like him to at least start with using an over-the-counter moisturizer. His skin is dry diffusely. I would like to check his thyroid as well.

## 2012-01-24 NOTE — Patient Instructions (Signed)

## 2012-01-25 ENCOUNTER — Ambulatory Visit (INDEPENDENT_AMBULATORY_CARE_PROVIDER_SITE_OTHER): Payer: Medicare Other

## 2012-01-25 DIAGNOSIS — W06XXXA Fall from bed, initial encounter: Secondary | ICD-10-CM

## 2012-01-25 DIAGNOSIS — IMO0002 Reserved for concepts with insufficient information to code with codable children: Secondary | ICD-10-CM

## 2012-01-25 DIAGNOSIS — F29 Unspecified psychosis not due to a substance or known physiological condition: Secondary | ICD-10-CM

## 2012-01-25 DIAGNOSIS — S0990XA Unspecified injury of head, initial encounter: Secondary | ICD-10-CM

## 2012-01-25 DIAGNOSIS — M21371 Foot drop, right foot: Secondary | ICD-10-CM

## 2012-01-25 DIAGNOSIS — R51 Headache: Secondary | ICD-10-CM

## 2012-02-04 ENCOUNTER — Ambulatory Visit (INDEPENDENT_AMBULATORY_CARE_PROVIDER_SITE_OTHER): Payer: Medicare Other | Admitting: Sports Medicine

## 2012-02-04 ENCOUNTER — Ambulatory Visit: Payer: Medicare Other

## 2012-02-04 ENCOUNTER — Ambulatory Visit (INDEPENDENT_AMBULATORY_CARE_PROVIDER_SITE_OTHER): Payer: Medicare Other

## 2012-02-04 VITALS — BP 104/69 | HR 67 | Wt 155.0 lb

## 2012-02-04 DIAGNOSIS — S62502A Fracture of unspecified phalanx of left thumb, initial encounter for closed fracture: Secondary | ICD-10-CM

## 2012-02-04 DIAGNOSIS — X58XXXA Exposure to other specified factors, initial encounter: Secondary | ICD-10-CM

## 2012-02-04 DIAGNOSIS — S62609A Fracture of unspecified phalanx of unspecified finger, initial encounter for closed fracture: Secondary | ICD-10-CM

## 2012-02-04 DIAGNOSIS — S62639A Displaced fracture of distal phalanx of unspecified finger, initial encounter for closed fracture: Secondary | ICD-10-CM

## 2012-02-04 MED ORDER — HYDROCODONE-ACETAMINOPHEN 5-325 MG PO TABS: 1.0000 | ORAL_TABLET | Freq: Three times a day (TID) | ORAL | Status: DC | PRN

## 2012-02-04 NOTE — Progress Notes (Signed)
Subjective: Willie Ward is 4 weeks status post a transverse fracture through the base of the distal phalanx of his left thumb. He has been in a stack splint and is improving his compliance with this. Tramadol is ineffective.   Objective:  General: Well-developed, well-nourished, and in no acute distress. Splint is removed, fingers inspected, there is some subungual hematoma but otherwise he is neurovascularly intact.  X-rays were reviewed and show stability of the fracture, there's been no increase in displacement or angulation. I do not yet see fracture callus.  Assessment/plan:

## 2012-02-04 NOTE — Assessment & Plan Note (Addendum)
4 weeks out, continues to improve.  I have reapplied his stack splint. I would like to see him back in additional 2 weeks. I am increasing his pain medication to hydrocodone.  I would like an x-ray prior to the next visit.

## 2012-02-12 ENCOUNTER — Encounter (HOSPITAL_COMMUNITY): Payer: Self-pay | Admitting: Psychiatry

## 2012-02-12 ENCOUNTER — Ambulatory Visit (INDEPENDENT_AMBULATORY_CARE_PROVIDER_SITE_OTHER): Payer: Medicare Other | Admitting: Psychiatry

## 2012-02-12 VITALS — BP 155/91 | HR 63 | Ht 71.0 in | Wt 156.0 lb

## 2012-02-12 DIAGNOSIS — F1020 Alcohol dependence, uncomplicated: Secondary | ICD-10-CM
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
## 2012-02-13 ENCOUNTER — Telehealth: Payer: Self-pay | Admitting: *Deleted

## 2012-02-13 DIAGNOSIS — S62502A Fracture of unspecified phalanx of left thumb, initial encounter for closed fracture: Secondary | ICD-10-CM

## 2012-02-13 MED ORDER — HYDROCODONE-ACETAMINOPHEN 10-325 MG PO TABS: 1.0000 | ORAL_TABLET | Freq: Three times a day (TID) | ORAL | Status: DC | PRN

## 2012-02-13 NOTE — Telephone Encounter (Signed)
Pt.notified

## 2012-02-13 NOTE — Telephone Encounter (Signed)
Pt called & states he was given rx for hydrocodone #40 take 1 po every 8 hrs. Pt states needs 2 in the 8 hr period to handle the pain. Pt has appt with you again on 02/25/12 at 11:15 & wants to know if you will refill his meds & give him enough to last until that appt. Please advise

## 2012-02-13 NOTE — Telephone Encounter (Signed)
In my outbox, higher dose.

## 2012-02-21 ENCOUNTER — Encounter: Payer: Self-pay | Admitting: Family Medicine

## 2012-02-21 ENCOUNTER — Ambulatory Visit (INDEPENDENT_AMBULATORY_CARE_PROVIDER_SITE_OTHER): Payer: Medicare Other | Admitting: Family Medicine

## 2012-02-21 VITALS — BP 97/58 | HR 67 | Resp 16 | Wt 148.0 lb

## 2012-02-21 DIAGNOSIS — F17200 Nicotine dependence, unspecified, uncomplicated: Secondary | ICD-10-CM

## 2012-02-21 DIAGNOSIS — M21379 Foot drop, unspecified foot: Secondary | ICD-10-CM

## 2012-02-21 DIAGNOSIS — Z720 Tobacco use: Secondary | ICD-10-CM

## 2012-02-21 DIAGNOSIS — M216X9 Other acquired deformities of unspecified foot: Secondary | ICD-10-CM

## 2012-02-21 DIAGNOSIS — F1010 Alcohol abuse, uncomplicated: Secondary | ICD-10-CM

## 2012-02-21 DIAGNOSIS — I10 Essential (primary) hypertension: Secondary | ICD-10-CM

## 2012-02-21 NOTE — Progress Notes (Signed)
  Subjective:    Patient ID: Willie Ward, male    DOB: 1940/09/14, 72 y.o.   MRN: [id number]  HPI HTN-  Pt denies chest pain, SOB, dizziness, or heart palpitations.  Taking meds as directed w/o problems.  Denies medication side effects.  Says BP has been 120s at home.  Says no dry skin or mouth.    Feet feel much better. Has been using Lubriderm on his feet and helps some. Has been doing the stretches and has been taking the gabapentin and now can flex is ankle again.   Was supposed to see his Urologist yesterday but got confused and thought it was here.    Says has really been trying  Hard to quit drinking but trying.  Was drinking 6-12 drinks at night. Only had 3 beers per week.  Has been trying to quit smoking as well. 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      Review of Systems     Objective:   Physical Exam  Constitutional: He is oriented to person, place, and time. He appears well-developed and well-nourished.  HENT:  Head: Normocephalic and atraumatic.       Rhinophyma.   Cardiovascular: Normal rate, regular rhythm and normal heart sounds.   Pulmonary/Chest: Effort normal and breath sounds normal.  Musculoskeletal:       Normal flexion and extension of his right foot.   Neurological: He is alert and oriented to person, place, and time.  Skin: Skin is warm and dry.  Psychiatric: He has a normal mood and affect. His behavior is normal.          Assessment & Plan:  HTN- BP is actually a little low today.  It was high las time but not sure how consistant he is with meds.  Cut HCTZ in half.  F/U in 2 months.    Foot drop - Pain is much improved. He has normal felxion of his foot fightnow.   Tob abuse- Encouraged cessation. WE discussed  Strategies. He has cut back with is fantastic.   Alcohol abuse - I recommended AA but says eh can't get transportation. Says has done it once before. Encouraged him to consider it.

## 2012-02-21 NOTE — Patient Instructions (Signed)
Cut your hydrochlorothiazide in half.

## 2012-02-25 ENCOUNTER — Ambulatory Visit (INDEPENDENT_AMBULATORY_CARE_PROVIDER_SITE_OTHER): Payer: Medicare Other | Admitting: Sports Medicine

## 2012-02-25 ENCOUNTER — Encounter: Payer: Self-pay | Admitting: Sports Medicine

## 2012-02-25 VITALS — BP 139/76 | HR 60 | Wt 155.0 lb

## 2012-02-25 DIAGNOSIS — S62502A Fracture of unspecified phalanx of left thumb, initial encounter for closed fracture: Secondary | ICD-10-CM

## 2012-02-25 DIAGNOSIS — M5090 Cervical disc disorder, unspecified, unspecified cervical region: Secondary | ICD-10-CM

## 2012-02-25 DIAGNOSIS — M5030 Other cervical disc degeneration, unspecified cervical region: Secondary | ICD-10-CM

## 2012-02-25 MED ORDER — HYDROCODONE-ACETAMINOPHEN 10-325 MG PO TABS: 1.0000 | ORAL_TABLET | Freq: Three times a day (TID) | ORAL | Status: DC | PRN

## 2012-02-25 NOTE — Assessment & Plan Note (Signed)
Healing well. He should go back into the stack splint for another 2 weeks, and I think he can discontinue afterwards. He can followup with me on an as-needed basis for this.

## 2012-02-25 NOTE — Progress Notes (Signed)
SPORTS MEDICINE CONSULTATION REPORT  Subjective:    CC: Followup  HPI: Right thumb fracture: Continues to improve 6 weeks out. He did not get his x-ray before the visit is recommended. He is also having trouble with a stack splint slipping off. He only has pain when trying to pinch objects, pain is localized, doesn't radiate, it is mild.  Neck pain: Has known cervical degenerative disc disease, tells me he has a visit with Dr. Oneal Grout for an epidural injection coming up later this week. He does request additional hydrocodone.  Past medical history, Surgical history, Family history not pertinant except as noted below, Social history, Allergies, and medications have been entered into the medical record, reviewed, and no changes needed.   Review of Systems: No headache, visual changes, nausea, vomiting, diarrhea, constipation, dizziness, abdominal pain, skin rash, fevers, chills, night sweats, weight loss, swollen lymph nodes, body aches, joint swelling, muscle aches, chest pain, shortness of breath, mood changes, visual or auditory hallucinations.   Objective:   Vitals:  Afebrile, vital signs stable. General: Well Developed, well nourished, and in no acute distress.  Neuro/Psych: Alert and oriented x3, extra-ocular muscles intact, able to move all 4 extremities, sensation grossly intact. Skin: Warm and dry, no rashes noted.  Respiratory: Not using accessory muscles, speaking in full sentences, trachea midline.  Cardiovascular: Pulses palpable, no extremity edema. Abdomen: Does not appear distended. Thumb still has subungual hematoma, there's only minimal tenderness to palpation, he has excellent strength to flexion and extension at the interphalangeal joint.  Impression and Recommendations:   This case required medical decision making of moderate complexity.

## 2012-02-25 NOTE — Assessment & Plan Note (Signed)
We are still awaiting interlaminar epidural steroid injection by Dr. Oneal Grout. Willie Ward tells me it scheduled for later this week. I will refill his hydrocodone with #40, he does understand that this is not going to be a regular thing. He will come back to see me after the injection.

## 2012-03-10 ENCOUNTER — Ambulatory Visit: Payer: Self-pay | Admitting: Family Medicine

## 2012-03-11 ENCOUNTER — Ambulatory Visit: Payer: Self-pay | Admitting: Family Medicine

## 2012-03-13 ENCOUNTER — Ambulatory Visit (INDEPENDENT_AMBULATORY_CARE_PROVIDER_SITE_OTHER): Payer: Medicare Other | Admitting: Family Medicine

## 2012-03-13 ENCOUNTER — Encounter: Payer: Self-pay | Admitting: Family Medicine

## 2012-03-13 VITALS — BP 98/61 | HR 53 | Ht 72.0 in | Wt 158.0 lb

## 2012-03-13 DIAGNOSIS — I10 Essential (primary) hypertension: Secondary | ICD-10-CM

## 2012-03-13 DIAGNOSIS — F1010 Alcohol abuse, uncomplicated: Secondary | ICD-10-CM

## 2012-03-13 DIAGNOSIS — E871 Hypo-osmolality and hyponatremia: Secondary | ICD-10-CM

## 2012-03-13 DIAGNOSIS — F10231 Alcohol dependence with withdrawal delirium: Secondary | ICD-10-CM

## 2012-03-13 MED ORDER — TRAMADOL HCL 50 MG PO TABS: 50.0000 mg | ORAL_TABLET | Freq: Two times a day (BID) | ORAL | Status: DC | PRN

## 2012-03-13 MED ORDER — TRAZODONE HCL 150 MG PO TABS: 150.0000 mg | ORAL_TABLET | Freq: Every day | ORAL | Status: DC

## 2012-03-13 NOTE — Patient Instructions (Signed)
Finding Treatment for Alcohol and Drug Addiction  It can be hard to find the right place to get professional treatment. Here are some important things to consider:   There are different types of treatment to choose from.   Some programs are live-in (residential) while others are not (outpatient). Sometimes a combination is offered.   No single type of program is right for everyone.   Most treatment programs involve a combination of education, counseling, and a 12-step, spiritually-based approach.   There are non-spiritually based programs (not 12-step).   Some treatment programs are government sponsored. They are geared for patients without private insurance.   Treatment programs can vary in many respects such as:   Cost and types of insurance accepted.   Types of on-site medical services offered.   Length of stay, setting, and size.   Overall philosophy of treatment.  A person may need specialized treatment or have needs not addressed by all programs. For example, adolescents need treatment appropriate for their age. Other people have secondary disorders that must be managed as well. Secondary conditions can include mental illness, such as depression or diabetes. Often, a period of detoxification from alcohol or drugs is needed. This requires medical supervision and not all programs offer this.  THINGS TO CONSIDER WHEN SELECTING A TREATMENT PROGRAM    Is the program certified by the appropriate government agency? Even private programs must be certified and employ certified professionals.   Does the program accept your insurance? If not, can a payment plan be set up?   Is the facility clean, organized, and well run? Do they allow you to speak with graduates who can share their treatment experience with you? Can you tour the facility? Can you meet with staff?   Does the program meet the full range of individual needs?   Does the treatment program address sexual orientation and physical disabilities?  Do they provide age, gender, and culturally appropriate treatment services?   Is treatment available in languages other than English?   Is long-term aftercare support or guidance encouraged and provided?   Is assessment of an individual's treatment plan ongoing to ensure it meets changing needs?   Does the program use strategies to encourage reluctant patients to remain in treatment long enough to increase the likelihood of success?   Does the program offer counseling (individual or group) and other behavioral therapies?   Does the program offer medicine as part of the treatment regimen, if needed?   Is there ongoing monitoring of possible relapse? Is there a defined relapse prevention program? Are services or referrals offered to family members to ensure they understand addiction and the recovery process? This would help them support the recovering individual.   Are 12-step meetings held at the center or is transport available for patients to attend outside meetings?  In countries outside of the [place], see local directories for contact information for services in your area.  Document Released: 12/21/2004 Document Revised: 04/16/2011 Document Reviewed: 07/03/2007  [employer] Patient Information 2013 [employer].

## 2012-03-13 NOTE — Progress Notes (Signed)
  Subjective:    Patient ID: Willie Ward, male    DOB: 1940-06-12, 72 y.o.   MRN: [id number]  HPI  Was drinking about a 12 pack of beer a day and then stopped about 2 weeks ago and then started to shake so bad he couldn't hodl a drink well. Says he felt like he couldn't control the shakes.  Started drinking again so the shakes would go away.  Says was slamming things down and not really knowing what he was doing. He felt SOB at the same time. No recent head injury to that he does have a history of old head trauma.  HTN -  Pt denies chest pain, SOB, dizziness, or heart palpitations.  Taking meds as directed w/o problems.  Denies medication side effects.    Review of Systems     Objective:   Physical Exam  Constitutional: He is oriented to person, place, and time. He appears well-developed and well-nourished.  HENT:  Head: Normocephalic and atraumatic.  Eyes: Conjunctivae normal are normal.  Neck: Neck supple. No thyromegaly present.  Cardiovascular: Normal rate, regular rhythm and normal heart sounds.   Pulmonary/Chest: Effort normal and breath sounds normal.  Musculoskeletal: He exhibits no edema.  Neurological: He is alert and oriented to person, place, and time.  Skin: Skin is warm and dry.  Psychiatric: He has a normal mood and affect. His behavior is normal.          Assessment & Plan:  Alcohol abuse - I. really think he was experiencing DTs.  He is now drinking 3 beers per day and says that the shakes have disappeared and resolved. Discussed that he really needs to be in a program to detox. Where it is safe for him to stop drinking. The me to prescribe medications that might be helpful for him. I do not have experience in this area so I recommended that he do this through an area program. I will call him back with that information. Will check BMP to make sure electrolytes are normal.  HTN - Well controlled. BP was high last time.  He is tolorating higher dose of metoprolol  no CP, SOB, or dizziness.  His blood pressures him is a little low today. The he is not feeling symptomatic whatsoever. We will keep an eye on this over the next 2 months. His blood pressure stays low and we may need to decrease his metoprolol back down to 25 mg. I'm beginning to wonder if the consistencies and his blood pressure is more due to compliance versus poor control.

## 2012-03-14 ENCOUNTER — Encounter: Payer: Self-pay | Admitting: Family Medicine

## 2012-03-24 ENCOUNTER — Ambulatory Visit: Payer: Self-pay | Admitting: Family Medicine

## 2012-03-28 ENCOUNTER — Other Ambulatory Visit: Payer: Self-pay | Admitting: Family Medicine

## 2012-04-03 ENCOUNTER — Telehealth: Payer: Self-pay | Admitting: *Deleted

## 2012-04-03 NOTE — Telephone Encounter (Signed)
It should be 2 tabs, 3 times a day which is a total of 6 tabs daily.

## 2012-04-03 NOTE — Telephone Encounter (Signed)
Pharmacy calls and wanted to clarify directions on Gabapentin because patient told her he takes 8 a day and last got filled on 03/12/2012 for #180. Doesn't even look like we have given this med to him it is listed as historical on his med list

## 2012-04-03 NOTE — Telephone Encounter (Signed)
Lets just cal the patient and clarify instructions with him.  Thank you.

## 2012-04-03 NOTE — Telephone Encounter (Signed)
Per pharmacy he is taking 8 a day and do you want to refill this again

## 2012-04-04 MED ORDER — GABAPENTIN 300 MG PO CAPS: 600.0000 mg | ORAL_CAPSULE | Freq: Three times a day (TID) | ORAL | Status: DC

## 2012-04-04 NOTE — Telephone Encounter (Signed)
Pt calls back and states that he is taking 2 tabs times a day. Refill sent

## 2012-04-04 NOTE — Telephone Encounter (Signed)
Patient has no phone number listed. Called his brother (emergency contact) and asked him to have Zohaib call us back. Barry Dienes, LPN

## 2012-04-05 LAB — COMPLETE METABOLIC PANEL WITH GFR
AST: 15 U/L (ref 0–37)
Alkaline Phosphatase: 80 U/L (ref 39–117)
BUN: 17 mg/dL (ref 6–23)
GFR, Est Non African American: 60 mL/min
Glucose, Bld: 101 mg/dL — ABNORMAL HIGH (ref 70–99)
Sodium: 135 mEq/L (ref 135–145)
Total Bilirubin: 0.7 mg/dL (ref 0.3–1.2)
Total Protein: 6.9 g/dL (ref 6.0–8.3)

## 2012-04-07 ENCOUNTER — Other Ambulatory Visit: Payer: Self-pay | Admitting: Family Medicine

## 2012-04-09 ENCOUNTER — Other Ambulatory Visit: Payer: Self-pay | Admitting: Family Medicine

## 2012-04-10 ENCOUNTER — Ambulatory Visit (INDEPENDENT_AMBULATORY_CARE_PROVIDER_SITE_OTHER): Payer: Medicare Other | Admitting: Family Medicine

## 2012-04-10 ENCOUNTER — Encounter: Payer: Self-pay | Admitting: Family Medicine

## 2012-04-10 VITALS — BP 164/82 | HR 56 | Wt 151.0 lb

## 2012-04-10 DIAGNOSIS — F1010 Alcohol abuse, uncomplicated: Secondary | ICD-10-CM

## 2012-04-10 DIAGNOSIS — I10 Essential (primary) hypertension: Secondary | ICD-10-CM

## 2012-04-10 DIAGNOSIS — J449 Chronic obstructive pulmonary disease, unspecified: Secondary | ICD-10-CM

## 2012-04-10 MED ORDER — METOPROLOL SUCCINATE ER 50 MG PO TB24: 50.0000 mg | ORAL_TABLET | Freq: Every day | ORAL | Status: DC

## 2012-04-10 NOTE — Progress Notes (Signed)
  Subjective:    Patient ID: Willie Ward, male    DOB: Nov 29, 1940, 72 y.o.   MRN: [id number]  HPI HTN-  Pt denies chest pain, SOB, dizziness, or heart palpitations.  Taking meds as directed w/o problems.  Denies medication side effects. He ran out of his metoprolol a couple of days ago. He says he just hasn't been to the pharmacy to pick it up yet. He has been taking his lisinopril at his last one this morning. He's not sure she's taking hydrochlorothiazide or not. He admits he ate a very salty meal last night. No regular exercise.  Says hasn't had a drop of alcohol in 4 weeks. He says he is really proud of himself.  COPD - no recent exeracerbations.  Doing well with his medications.  Review of Systems     Objective:   Physical Exam  Constitutional: He is oriented to person, place, and time. He appears well-developed and well-nourished.  HENT:  Head: Normocephalic and atraumatic.  Cardiovascular: Normal rate, regular rhythm and normal heart sounds.   Pulmonary/Chest: Effort normal and breath sounds normal.  Neurological: He is alert and oriented to person, place, and time.  Skin: Skin is warm and dry.  Psychiatric: He has a normal mood and affect. His behavior is normal.          Assessment & Plan:  HTN- uncontrolled today but was low last time.  I really think compliance is a major issue for him. He is only taking the lisinopril as he ran out of metoprolol several days ago. He also ate a very salty meal last night. Reminded him again about low salt. He can certainly consider a salt substitute as well. Also making sure that he is very consistent about his medications is extremely important. Followup in one month.  Alcohol abuse - congratulated him on being sober for at least the last 4 weeks. And encouraged him to continue to work out every day. I still think he'll be a great candidate for an AA program.  COPD-stable. No recent exacerbations. Continue current regimen.

## 2012-04-16 ENCOUNTER — Ambulatory Visit: Payer: Self-pay | Admitting: Sports Medicine

## 2012-04-16 ENCOUNTER — Encounter: Payer: Self-pay | Admitting: Sports Medicine

## 2012-04-16 ENCOUNTER — Ambulatory Visit (INDEPENDENT_AMBULATORY_CARE_PROVIDER_SITE_OTHER): Payer: Medicare Other | Admitting: Sports Medicine

## 2012-04-16 VITALS — BP 158/85 | HR 53 | Wt 152.0 lb

## 2012-04-16 DIAGNOSIS — S62502D Fracture of unspecified phalanx of left thumb, subsequent encounter for fracture with routine healing: Secondary | ICD-10-CM

## 2012-04-16 DIAGNOSIS — M5090 Cervical disc disorder, unspecified, unspecified cervical region: Secondary | ICD-10-CM

## 2012-04-16 MED ORDER — GABAPENTIN 800 MG PO TABS: 800.0000 mg | ORAL_TABLET | Freq: Three times a day (TID) | ORAL | Status: DC

## 2012-04-16 NOTE — Assessment & Plan Note (Signed)
Clinically healed. Return as needed for this. 

## 2012-04-16 NOTE — Progress Notes (Signed)
  Subjective:    CC: Followup  HPI: Left thumb fracture: Severe and of the distal phalanx, currently clinically healed.  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 He is been seeing Dr. Oneal Grout with pain management, he's recently had a C7-T1 interlaminar epidural steroid injection. This was done with lidocaine, Marcaine, and steroid. Unfortunately he notes zero relief not even for an hour after the procedure. Dr. Oneal Grout will not prescribe him narcotics and would like him to see a neurosurgeon for consideration of further intervention. He is taking 300 mg of gabapentin 3-4 times a day, has not yet noted any benefit from this either. He says pain travels down his paraspinal muscles all the way to his lumbar spine, and he has numbness in both hands.  Past medical history, Surgical history, Family history not pertinant except as noted below, Social history, Allergies, and medications have been entered into the medical record, reviewed, and no changes needed.   Review of Systems: No headache, visual changes, nausea, vomiting, diarrhea, constipation, dizziness, abdominal pain, skin rash, fevers, chills, night sweats, weight loss, swollen lymph nodes, body aches, joint swelling, muscle aches, chest pain, shortness of breath, mood changes, visual or auditory hallucinations.   Objective:   General: Well Developed, well nourished, and in no acute distress.  Neuro/Psych: Alert and oriented x3, extra-ocular muscles intact, able to move all 4 extremities, sensation grossly intact. Skin: Warm and dry, no rashes noted.  Respiratory: Not using accessory muscles, speaking in full sentences, trachea midline.  Cardiovascular: Pulses palpable, no extremity edema. Abdomen: Does not appear distended. Neck: Inspection unremarkable. No palpable stepoffs. Negative Spurling's maneuver. Full neck range of motion Grip strength and sensation normal in  bilateral hands Strength good C4 to T1 distribution No sensory change to C4 to T1 Negative Hoffman sign bilaterally Reflexes normal  Left thumb shows no pain or swelling of the interphalangeal joint, there is a small amount of bruising under the nail plate which is growing out. He has full range of motion, and full strength at the joint.  Impression and Recommendations:   This case required medical decision making of moderate complexity.

## 2012-04-16 NOTE — Assessment & Plan Note (Addendum)
I do find it odd that he has had no response to a C7-T1 interlaminar epidural steroid injection with lidocaine and Marcaine. Dr. Oneal Grout is sending him to a neurosurgeon to see if there's any other options. Neither I nor Dr. Oneal Grout will be prescribing narcotics. I am going to increase his gabapentin to the maximum dose.

## 2012-04-29 ENCOUNTER — Other Ambulatory Visit: Payer: Self-pay | Admitting: Family Medicine

## 2012-04-29 NOTE — Telephone Encounter (Signed)
Ok to fill?  It's on his past med list but not current.

## 2012-05-08 ENCOUNTER — Ambulatory Visit: Payer: Self-pay | Admitting: Family Medicine

## 2012-05-11 ENCOUNTER — Other Ambulatory Visit: Payer: Self-pay | Admitting: Family Medicine

## 2012-05-12 ENCOUNTER — Ambulatory Visit (HOSPITAL_COMMUNITY): Payer: Self-pay | Admitting: Psychiatry

## 2012-05-15 ENCOUNTER — Encounter (HOSPITAL_COMMUNITY): Payer: Self-pay | Admitting: Psychiatry

## 2012-05-15 ENCOUNTER — Ambulatory Visit (INDEPENDENT_AMBULATORY_CARE_PROVIDER_SITE_OTHER): Payer: Medicare Other | Admitting: Psychiatry

## 2012-05-15 VITALS — BP 130/86 | HR 53 | Ht 71.0 in | Wt 161.0 lb

## 2012-05-15 DIAGNOSIS — F1020 Alcohol dependence, uncomplicated: Secondary | ICD-10-CM
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
## 2012-06-05 ENCOUNTER — Ambulatory Visit: Payer: Self-pay | Admitting: Family Medicine

## 2012-06-13 ENCOUNTER — Encounter (HOSPITAL_COMMUNITY): Payer: Self-pay | Admitting: Psychiatry

## 2012-06-13 ENCOUNTER — Ambulatory Visit (INDEPENDENT_AMBULATORY_CARE_PROVIDER_SITE_OTHER): Payer: Medicare Other | Admitting: Psychiatry

## 2012-06-13 VITALS — BP 109/84 | HR 64 | Ht 70.0 in | Wt 155.0 lb

## 2012-06-13 DIAGNOSIS — F1020 Alcohol dependence, uncomplicated: Secondary | ICD-10-CM
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
## 2012-06-17 ENCOUNTER — Ambulatory Visit (INDEPENDENT_AMBULATORY_CARE_PROVIDER_SITE_OTHER): Payer: Medicare Other | Admitting: Family Medicine

## 2012-06-17 ENCOUNTER — Encounter: Payer: Self-pay | Admitting: Family Medicine

## 2012-06-17 VITALS — BP 157/84 | HR 57 | Wt 156.0 lb

## 2012-06-17 DIAGNOSIS — M549 Dorsalgia, unspecified: Secondary | ICD-10-CM

## 2012-06-17 DIAGNOSIS — J441 Chronic obstructive pulmonary disease with (acute) exacerbation: Secondary | ICD-10-CM

## 2012-06-17 DIAGNOSIS — G8929 Other chronic pain: Secondary | ICD-10-CM

## 2012-06-17 DIAGNOSIS — I10 Essential (primary) hypertension: Secondary | ICD-10-CM

## 2012-06-17 MED ORDER — ACLIDINIUM BROMIDE 400 MCG/ACT IN AEPB: 1.0000 | INHALATION_SPRAY | Freq: Two times a day (BID) | RESPIRATORY_TRACT | Status: DC

## 2012-06-17 NOTE — Progress Notes (Signed)
  Subjective:    Patient ID: Willie Ward, male    DOB: [birth year], 72 y.o.   MRN: [id number]  HPI HTN- missed his meds for 2 days.  No CP or SOB.  Was well controlled last time.  Has been more stressed than usual.  COPD- Has been more SOB in the last 2 weeks.  Says will use his inhaler and that helps him, within a few minutes after using it. No chest pain. No change as far as cough or change in sputum production. He does have the inhaler with him and it does seem to be in date.Marland Kitchen  He is stil smoking.  Some hx of seasonal allergies.   Needs to schedule f/u with Dr. Baron Sane downstairs.    Chronic pain - Wants to know if we can increase his tramadol to 3 times a day. Says it does provide some pain relief for him. It doesn't completely take it away pain away but it is much more manageable. He did see Dr. Oneal Grout, a pain management physician here locally a couple of times. He says that he did undergo injections and they did not help. He says he really doesn't want to be on chronic pain management meds. He says he was referred to a pain psychiatrist but unfortunately he was out of network. They also recommended that he see a neurosurgeon but he is Re: seeing a neurosurgeon twice in the past several years and was told that he really should not have surgery on his back.  Review of Systems     Objective:   Physical Exam  Constitutional: He is oriented to person, place, and time. He appears well-developed and well-nourished.  HENT:  Head: Normocephalic and atraumatic.  Cardiovascular: Normal rate, regular rhythm and normal heart sounds.   Pulmonary/Chest: Effort normal and breath sounds normal.  Neurological: He is alert and oriented to person, place, and time.  Skin: Skin is warm and dry.  Psychiatric: He has a normal mood and affect. His behavior is normal.          Assessment & Plan:  HTN - Uncontrolled. Encouraged him to restart medications and we'll recheck his blood pressure in one  month.  COPD- will add Trudorza to his regimen. Especially since his some increased short of breath the last couple weeks. Lung exam is clear today. Most likely increase in symptoms from his COPD. No significant allergic symptoms today. Continue to use Pro air as needed. Followup in one month for spirometry. His been over a year since we did his last test. He does have moderate COPD. I demonstrated to him how to use the [place]. He was given samples today. We will see if it's covered under his insurance plan.  Chronic back pain-we could consider increasing his tramadol to 3 times a day but with his history of seizures I'm fearful about doing that. Though, several of his seizures were actually secondary to benzodiazepine withdrawal which he is no longer taking. For now we will continue with twice a day dosing and consider increasing to 3 times a day at some point in the future. He is no longer driving so this should not be a risk factor in case he does have a seizure.

## 2012-06-20 ENCOUNTER — Telehealth (HOSPITAL_COMMUNITY): Payer: Self-pay

## 2012-06-20 NOTE — Telephone Encounter (Signed)
Called patient. Informed him of his lab results.  As alcohol level was negative he may continue paxil.

## 2012-07-14 ENCOUNTER — Ambulatory Visit (INDEPENDENT_AMBULATORY_CARE_PROVIDER_SITE_OTHER): Payer: Medicare Other | Admitting: Psychiatry

## 2012-07-14 ENCOUNTER — Encounter (HOSPITAL_COMMUNITY): Payer: Self-pay | Admitting: Psychiatry

## 2012-07-14 VITALS — BP 111/64 | HR 58 | Ht 70.0 in | Wt 152.0 lb

## 2012-07-14 DIAGNOSIS — F1020 Alcohol dependence, uncomplicated: Secondary | ICD-10-CM

## 2012-07-14 DIAGNOSIS — F411 Generalized anxiety disorder: Secondary | ICD-10-CM

## 2012-07-14 MED ORDER — DULOXETINE HCL 30 MG PO CPEP: 30.0000 mg | ORAL_CAPSULE | Freq: Every day | ORAL | Status: DC

## 2012-07-14 MED ORDER — PAROXETINE HCL 20 MG PO TABS: 20.0000 mg | ORAL_TABLET | ORAL | Status: DC
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
## 2012-07-15 ENCOUNTER — Ambulatory Visit: Payer: Self-pay | Admitting: Family Medicine

## 2012-07-25 ENCOUNTER — Other Ambulatory Visit: Payer: Self-pay | Admitting: Family Medicine

## 2012-08-07 ENCOUNTER — Encounter: Payer: Self-pay | Admitting: Family Medicine

## 2012-08-07 ENCOUNTER — Ambulatory Visit (INDEPENDENT_AMBULATORY_CARE_PROVIDER_SITE_OTHER): Payer: Medicare Other | Admitting: Family Medicine

## 2012-08-07 VITALS — BP 112/65 | HR 55 | Ht 67.0 in | Wt 150.0 lb

## 2012-08-07 DIAGNOSIS — F17200 Nicotine dependence, unspecified, uncomplicated: Secondary | ICD-10-CM

## 2012-08-07 DIAGNOSIS — M549 Dorsalgia, unspecified: Secondary | ICD-10-CM

## 2012-08-07 DIAGNOSIS — G8929 Other chronic pain: Secondary | ICD-10-CM

## 2012-08-07 DIAGNOSIS — Z720 Tobacco use: Secondary | ICD-10-CM

## 2012-08-07 DIAGNOSIS — J449 Chronic obstructive pulmonary disease, unspecified: Secondary | ICD-10-CM

## 2012-08-07 DIAGNOSIS — J441 Chronic obstructive pulmonary disease with (acute) exacerbation: Secondary | ICD-10-CM

## 2012-08-07 DIAGNOSIS — J019 Acute sinusitis, unspecified: Secondary | ICD-10-CM

## 2012-08-07 DIAGNOSIS — J209 Acute bronchitis, unspecified: Secondary | ICD-10-CM

## 2012-08-07 MED ORDER — DOXYCYCLINE HYCLATE 100 MG PO TABS: 100.0000 mg | ORAL_TABLET | Freq: Two times a day (BID) | ORAL | Status: DC

## 2012-08-07 MED ORDER — ACLIDINIUM BROMIDE 400 MCG/ACT IN AEPB: 1.0000 | INHALATION_SPRAY | Freq: Two times a day (BID) | RESPIRATORY_TRACT | Status: DC

## 2012-08-07 MED ORDER — FLUTICASONE FUROATE-VILANTEROL 100-25 MCG/INH IN AEPB: 1.0000 | INHALATION_SPRAY | Freq: Every day | RESPIRATORY_TRACT | Status: DC

## 2012-08-07 MED ORDER — HYDROCODONE-ACETAMINOPHEN 5-325 MG PO TABS: 2.0000 | ORAL_TABLET | Freq: Every day | ORAL | Status: DC | PRN

## 2012-08-07 MED ORDER — PREDNISONE 20 MG PO TABS: 40.0000 mg | ORAL_TABLET | Freq: Every day | ORAL | Status: DC

## 2012-08-07 MED ORDER — ALBUTEROL SULFATE (2.5 MG/3ML) 0.083% IN NEBU
2.5000 mg | INHALATION_SOLUTION | Freq: Once | RESPIRATORY_TRACT | Status: AC
  Administered 2012-08-07: 2.5 mg via RESPIRATORY_TRACT

## 2012-08-07 NOTE — Progress Notes (Signed)
  Subjective:    Patient ID: Willie Ward, male    DOB: [birth year], 72 y.o.   MRN: [id number]  HPI Cough x 3 weeks with green sputusm. Has tried some OTC Affrin. + nasal congestion. No fever.  No chills or sweats.  No ST or er pain. Feels swimmy headed.  No GI sxs.  Smoking 2.5 ppd.  Has had more back pain than usual. He has not been using his tudorza or his rescue albuterol. He has been more short of breath than usual.  Moderate COPD-he is also here for spirometry. His been a while since we have done this. He still continues to smoke. He had quit for short period of time but started again.  Review of Systems     Objective:   Physical Exam  Constitutional: He is oriented to person, place, and time. He appears well-developed and well-nourished.  HENT:  Head: Normocephalic and atraumatic.  Right Ear: External ear normal.  Left Ear: External ear normal.  Nose: Nose normal.  Mouth/Throat: Oropharynx is clear and moist.  TMs and canals are clear.   Eyes: Conjunctivae and EOM are normal. Pupils are equal, round, and reactive to light.  Neck: Neck supple. No thyromegaly present.  Cardiovascular: Normal rate and normal heart sounds.   Pulmonary/Chest: Effort normal and breath sounds normal.  Lymphadenopathy:    He has no cervical adenopathy.  Neurological: He is alert and oriented to person, place, and time.  Skin: Skin is warm and dry.  Psychiatric: He has a normal mood and affect.          Assessment & Plan:  Bronchitis/Sinusitis w/ COPD exacerbation- Will tx with doxycycline and 5 days of prednisone.  He has not really been using his Willie Ward. Given samples of Breo to use once a day.  Givne 1 mo worth.   COPD-based on his spirometry today it looks like he has more severe obstruction. His FEC is 64%, FEV1 of 33% and ratio of 42%. This may be affected by the bronchitis that he has today. Continue tudorza, rescue albuterol and add Breo.  In addition discussed the importance of  smoking cessation. Consider repeat spirometry in 3 or 4 months once he has improved.  Tob Abuse - Encoraged cesation.  Chronic Back pain - he did see Dr. Oneal Grout for pain management previously. She had recommend referral to a neurosurgeon. He really didn't want to go through this because he says he has seen 2 neurosurgeons in the past who told him not to have surgery. He does use the tramadol for pain relief but says it's not nearly adequate. He would like to have something a little stronger. I agreed to give him hydrocodone, one tab per day.

## 2012-08-07 NOTE — Patient Instructions (Signed)
Unfortunately, we did not have any samples of tudorza. But I did send her for new prescription to your pharmacy. Continue to use this twice a day. Also start a new samples o fBreo that I gave you. This is done once a day in the morning. Make sure to rinse her mouth after using it. Complete the MRI exam of steroids. Call me if you feel like you're not improving, or if you're getting worse.

## 2012-08-12 ENCOUNTER — Encounter: Payer: Self-pay | Admitting: *Deleted

## 2012-08-15 ENCOUNTER — Other Ambulatory Visit: Payer: Self-pay | Admitting: Family Medicine

## 2012-08-18 ENCOUNTER — Ambulatory Visit (HOSPITAL_COMMUNITY): Payer: Self-pay | Admitting: Psychiatry

## 2012-08-29 ENCOUNTER — Encounter: Payer: Self-pay | Admitting: Family Medicine

## 2012-09-04 ENCOUNTER — Other Ambulatory Visit: Payer: Self-pay | Admitting: Sports Medicine

## 2012-09-04 ENCOUNTER — Other Ambulatory Visit: Payer: Self-pay | Admitting: Psychiatry

## 2012-09-08 ENCOUNTER — Ambulatory Visit (INDEPENDENT_AMBULATORY_CARE_PROVIDER_SITE_OTHER): Payer: Medicare Other | Admitting: Psychiatry

## 2012-09-08 ENCOUNTER — Encounter (HOSPITAL_COMMUNITY): Payer: Self-pay | Admitting: Psychiatry

## 2012-09-08 VITALS — BP 125/70 | HR 52 | Ht 70.0 in | Wt 152.0 lb

## 2012-09-08 DIAGNOSIS — F1020 Alcohol dependence, uncomplicated: Secondary | ICD-10-CM

## 2012-09-08 DIAGNOSIS — F1021 Alcohol dependence, in remission: Secondary | ICD-10-CM

## 2012-09-08 DIAGNOSIS — F1220 Cannabis dependence, uncomplicated: Secondary | ICD-10-CM

## 2012-09-08 MED ORDER — PAROXETINE HCL 20 MG PO TABS: 20.0000 mg | ORAL_TABLET | ORAL | Status: DC

## 2012-09-08 MED ORDER — DULOXETINE HCL 60 MG PO CPEP: 60.0000 mg | ORAL_CAPSULE | Freq: Every day | ORAL | Status: DC

## 2012-09-08 MED ORDER — TRAZODONE HCL 150 MG PO TABS: ORAL_TABLET | ORAL | Status: DC
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
## 2012-09-10 DIAGNOSIS — F1220 Cannabis dependence, uncomplicated: Secondary | ICD-10-CM | POA: Insufficient documentation

## 2012-09-11 ENCOUNTER — Ambulatory Visit (INDEPENDENT_AMBULATORY_CARE_PROVIDER_SITE_OTHER): Payer: Medicare Other | Admitting: Family Medicine

## 2012-09-11 ENCOUNTER — Encounter: Payer: Self-pay | Admitting: Family Medicine

## 2012-09-11 VITALS — BP 97/59 | HR 57 | Wt 153.0 lb

## 2012-09-11 DIAGNOSIS — Z125 Encounter for screening for malignant neoplasm of prostate: Secondary | ICD-10-CM

## 2012-09-11 DIAGNOSIS — Z720 Tobacco use: Secondary | ICD-10-CM

## 2012-09-11 DIAGNOSIS — I10 Essential (primary) hypertension: Secondary | ICD-10-CM

## 2012-09-11 DIAGNOSIS — K148 Other diseases of tongue: Secondary | ICD-10-CM

## 2012-09-11 DIAGNOSIS — Z0000 Encounter for general adult medical examination without abnormal findings: Secondary | ICD-10-CM

## 2012-09-11 DIAGNOSIS — G8929 Other chronic pain: Secondary | ICD-10-CM

## 2012-09-11 MED ORDER — HYDROCODONE-ACETAMINOPHEN 5-325 MG PO TABS: 2.0000 | ORAL_TABLET | Freq: Every day | ORAL | Status: DC | PRN

## 2012-09-11 NOTE — Progress Notes (Signed)
Subjective:    Willie Ward is a 72 y.o. male who presents for Medicare Annual/Subsequent preventive examination.   Preventive Screening-Counseling & Management  Tobacco History  Smoking status  . Current Every Day Smoker -- 2.50 packs/day  . Types: Cigarettes  . Last Attempt to Quit: 08/20/2010  Smokeless tobacco  . Not on file    Problems Prior to Visit 1. he would like his hydrocodone to be increased to twice a day for his chronic back pain. 2. He says he is currently not drinking. 3. is still battling with smoking. He says sometimes he can get up for a day or 2 but then started smoking again.  Current Problems (verified) Patient Active Problem List   Diagnosis Date Noted  . Marijuana dependence 09/10/2012  . Fracture of thumb, left, closed 01/07/2012  . History of seizures 12/27/2011  . Cervical disc disease 08/10/2011  . Neuropathy 06/26/2011  . Alcohol dependence 06/26/2011  . Protein-calorie malnutrition, mild 04/11/2011  . Moderate COPD (chronic obstructive pulmonary disease) 03/12/2011  . Tobacco abuse 03/12/2011  . Adenocarcinoma of prostate 11/29/2010  . Cataract 11/07/2010  . Insomnia 09/20/2010  . Essential hypertension, benign 09/20/2010  . Chronic low back pain 08/24/2010  . History of prostate cancer 08/24/2010  . Generalized anxiety disorder 08/24/2010    Medications Prior to Visit Current Outpatient Prescriptions on File Prior to Visit  Medication Sig Dispense Refill  . Aclidinium Bromide (TUDORZA PRESSAIR) 400 MCG/ACT AEPB Inhale 1 puff into the lungs 2 (two) times daily.  1 each  3  . AMBULATORY NON FORMULARY MEDICATION Medication Name:  hospital bed. Dx:  Generalized weakness with history of multiple falls and COPD  1 Units  0  . aspirin 81 MG tablet Take 81 mg by mouth daily.      . DULoxetine (CYMBALTA) 60 MG capsule Take 1 capsule (60 mg total) by mouth daily.  30 capsule  1  . Fluticasone Furoate-Vilanterol (BREO ELLIPTA) 100-25 MCG/INH  AEPB Inhale 1 Inhaler into the lungs daily.  28 each  0  . gabapentin (NEURONTIN) 800 MG tablet TAKE 1 TABLET BY MOUTH THREE TIMES DAILY  90 tablet  0  . hydrochlorothiazide (HYDRODIURIL) 25 MG tablet Take 1 tablet (25 mg total) by mouth daily.  30 tablet  11  . lisinopril (PRINIVIL,ZESTRIL) 40 MG tablet TAKE 1 TABLET BY MOUTH EVERY DAY  30 tablet  3  . metoprolol succinate (TOPROL-XL) 50 MG 24 hr tablet TAKE 1 TABLET BY MOUTH DAILY  30 tablet  4  . Multiple Vitamin (MULTIVITAMIN) tablet Take 1 tablet by mouth daily.      Marland Kitchen PARoxetine (PAXIL) 20 MG tablet Take 1 tablet (20 mg total) by mouth every morning.  30 tablet  1  . traZODone (DESYREL) 150 MG tablet TAKE 1-2 TABLETS BY MOUTH AT BEDTIME  60 tablet  1  . VENTOLIN HFA 108 (90 BASE) MCG/ACT inhaler INHALE 2 PUFFS INTO THE LUNGS EVERY 6 HOURS AS NEEDED FOR WHEEZING  18 g  0   No current facility-administered medications on file prior to visit.    Current Medications (verified) Current Outpatient Prescriptions  Medication Sig Dispense Refill  . Aclidinium Bromide (TUDORZA PRESSAIR) 400 MCG/ACT AEPB Inhale 1 puff into the lungs 2 (two) times daily.  1 each  3  . AMBULATORY NON FORMULARY MEDICATION Medication Name:  hospital bed. Dx:  Generalized weakness with history of multiple falls and COPD  1 Units  0  . aspirin 81 MG tablet Take  81 mg by mouth daily.      . DULoxetine (CYMBALTA) 60 MG capsule Take 1 capsule (60 mg total) by mouth daily.  30 capsule  1  . Fluticasone Furoate-Vilanterol (BREO ELLIPTA) 100-25 MCG/INH AEPB Inhale 1 Inhaler into the lungs daily.  28 each  0  . gabapentin (NEURONTIN) 800 MG tablet TAKE 1 TABLET BY MOUTH THREE TIMES DAILY  90 tablet  0  . hydrochlorothiazide (HYDRODIURIL) 25 MG tablet Take 1 tablet (25 mg total) by mouth daily.  30 tablet  11  . HYDROcodone-acetaminophen (NORCO/VICODIN) 5-325 MG per tablet Take 2 tablets by mouth daily as needed for pain.  30 tablet  0  . lisinopril (PRINIVIL,ZESTRIL) 40 MG  tablet TAKE 1 TABLET BY MOUTH EVERY DAY  30 tablet  3  . metoprolol succinate (TOPROL-XL) 50 MG 24 hr tablet TAKE 1 TABLET BY MOUTH DAILY  30 tablet  4  . Multiple Vitamin (MULTIVITAMIN) tablet Take 1 tablet by mouth daily.      Marland Kitchen PARoxetine (PAXIL) 20 MG tablet Take 1 tablet (20 mg total) by mouth every morning.  30 tablet  1  . traZODone (DESYREL) 150 MG tablet TAKE 1-2 TABLETS BY MOUTH AT BEDTIME  60 tablet  1  . VENTOLIN HFA 108 (90 BASE) MCG/ACT inhaler INHALE 2 PUFFS INTO THE LUNGS EVERY 6 HOURS AS NEEDED FOR WHEEZING  18 g  0   No current facility-administered medications for this visit.     Allergies (verified) Benzodiazepines and Tape   PAST HISTORY  Family History Family History  Problem Relation Age of Onset  . Heart attack Mother   . Depression Son   . Diabetes Sister   . Hypertension Mother   . Hypertension Brother     Social History History  Substance Use Topics  . Smoking status: Current Every Day Smoker -- 2.50 packs/day    Types: Cigarettes    Last Attempt to Quit: 08/20/2010  . Smokeless tobacco: Not on file  . Alcohol Use: No     Comment: Stopped    Are there smokers in your home (other than you)?  Yes  Risk Factors Current exercise habits: waks once a while    Dietary issues discussed: none   Cardiac risk factors: advanced age (older than 88 for men, 46 for women), hypertension and smoking/ tobacco exposure.  Depression Screen (Note: if answer to either of the following is "Yes", a more complete depression screening is indicated)   Q1: Over the past two weeks, have you felt down, depressed or hopeless? No  Q2: Over the past two weeks, have you felt little interest or pleasure in doing things? Yes  Have you lost interest or pleasure in daily life? No  Do you often feel hopeless? No  Do you cry easily over simple problems? No  Activities of Daily Living In your present state of health, do you have any difficulty performing the following  activities?:  Driving? No Managing money?  No Feeding yourself? No Getting from bed to chair? Yes Climbing a flight of stairs? Yes Preparing food and eating?: No Bathing or showering? No Getting dressed: No Getting to the toilet? No Using the toilet:No Moving around from place to place: No In the past year have you fallen or had a near fall?:Yes   Are you sexually active?  No  Do you have more than one partner?  No  Hearing Difficulties: Yes Do you often ask people to speak up or repeat themselves? Yes Do  you experience ringing or noises in your ears? No Do you have difficulty understanding soft or whispered voices? No   Do you feel that you have a problem with memory? No  Do you often misplace items? Yes  Do you feel safe at home?  Yes  Cognitive Testing  Alert? Yes  Normal Appearance?No, unshaven  Oriented to person? Yes  Place? Yes   Time? Yes  Recall of three objects?  Yes  Can perform simple calculations? Yes  Displays appropriate judgment?Yes  Can read the correct time from a watch face?Yes   Advanced Directives have been discussed with the patient? Yes   List the Names of Other Physician/Practitioners you currently use: 1.    Indicate any recent Medical Services you may have received from other than [facility] providers in the past year (date may be approximate).  Immunization History  Administered Date(s) Administered  . Influenza Split 10/30/2010, 11/02/2011  . Pneumococcal Polysaccharide 12/01/2010  . Tdap 10/30/2010    Screening Tests Health Maintenance  Topic Date Due  . Influenza Vaccine  10/06/2012  . Colonoscopy  01/07/2018  . Tetanus/tdap  10/29/2020  . Pneumococcal Polysaccharide Vaccine Age 65 And Over  Completed  . Zostavax  Addressed    All answers were reviewed with the patient and necessary referrals were made:  Zimir Kittleson, MD   09/11/2012   History reviewed: allergies, current medications, past family history, past medical  history, past social history, past surgical history and problem list  Review of Systems A comprehensive review of systems was negative.    Objective:     Vision by Snellen chart: right eye:20/30, left eye:20/30 Blood pressure 97/59, pulse 57, weight 153 lb (69.4 kg). Body mass index is 21.95 kg/(m^2).  BP 97/59  Pulse 57  Wt 153 lb (69.4 kg)  BMI 21.95 kg/m2  General Appearance:    Alert, cooperative, no distress, appears stated age  Head:    Normocephalic, without obvious abnormality, atraumatic, tongue with a dark purple lesion.   Eyes:    PERRL, conjunctiva/corneas clear, EOM's intact, both eyes       Ears:    Normal TM's and external ear canals, both ears  Nose:   Nares normal, septum midline, mucosa normal, no drainage    or sinus tenderness  Throat:   Lips, mucosa, and tongue normal; teeth and gums normal  Neck:   Supple, symmetrical, trachea midline, no adenopathy;       thyroid:  No enlargement/tenderness/nodules; no carotid   bruit or JVD  Back:     Symmetric, no curvature, ROM normal, no CVA tenderness  Lungs:     Clear to auscultation bilaterally, respirations unlabored  Chest wall:    No tenderness or deformity  Heart:    Regular rate and rhythm, S1 and S2 normal, no murmur, rub   or gallop  Abdomen:     Soft, non-tender, bowel sounds active all four quadrants,    no masses, no organomegaly  Genitalia:    Not performed.   Rectal:    Not performed.   Extremities:   Extremities normal, atraumatic, no cyanosis or edema  Pulses:   2+ and symmetric all extremities  Skin:   Skin color, texture, turgor normal, no rashes or lesions  Lymph nodes:   Cervical, supraclavicular, and axillary nodes normal  Neurologic:   CNII-XII intact. Normal strength, sensation and reflexes      throughout       Assessment:     Annual Medicare Wellness Exam  Plan:     During the course of the visit the patient was educated and counseled about appropriate screening and  preventive services including:    Smoking cessation counseling  Advanced directives: has an advanced directive - a copy HAS NOT been provided.  Tongue lesion - will refer to ENT for further evaluation. He says it's been there for about 10 years and has been slowly getting larger. The he is a heavy smoker and prior history of alcohol use this is worrisome for cancer. Like him to see an ENT to see if it needs to be biopsied for further evaluation.  Followup in one month for COPD and back pain.  Back pain-he is no longer seeing pain management. He said he was referred to see a surgeon he is not interested in surgery. I find it unusual that they would not have offered him at least some type of medication for pain relief as such as referral to surgeon. I would like to get a copy of the notes. I did give him a small quantity of hydrocodone 30 tabs. I explained to him that this should last 30 days. He says he would like it to be increased to twice a day.  Tobacco abuse-he is interested in possibly starting Chantix. He wants to save the money to do this. He says he will call me when he is ready. Return is also an option. We will need to monitor his mood carefully if he takes his medication.  Diet review for nutrition referral? Yes ____  Not Indicated __x_    Patient Instructions (the written plan) was given to the patient.  Medicare Attestation I have personally reviewed: The patient's medical and social history Their use of alcohol, tobacco or illicit drugs Their current medications and supplements The patient's functional ability including ADLs,fall risks, home safety risks, cognitive, and hearing and visual impairment Diet and physical activities Evidence for depression or mood disorders  The patient's weight, height, BMI, and visual acuity have been recorded in the chart.  I have made referrals, counseling, and provided education to the patient based on review of the above and I have  provided the patient with a written personalized care plan for preventive services.     Elesia Pemberton, MD   09/11/2012

## 2012-09-19 LAB — LIPID PANEL
Cholesterol: 153 mg/dL (ref 0–200)
HDL: 47 mg/dL (ref >39)
LDL Cholesterol: 89 mg/dL (ref 0–99)
Total CHOL/HDL Ratio: 3.3 Ratio
Triglycerides: 83 mg/dL (ref <150)
VLDL: 17 mg/dL (ref 0–40)

## 2012-09-19 LAB — COMPLETE METABOLIC PANEL WITH GFR
ALT: <8 U/L (ref 0–53)
CO2: 30 mEq/L (ref 19–32)
Chloride: 100 mEq/L (ref 96–112)
GFR, Est African American: 51 mL/min — ABNORMAL LOW
Sodium: 136 mEq/L (ref 135–145)
Total Bilirubin: 0.5 mg/dL (ref 0.3–1.2)
Total Protein: 6.2 g/dL (ref 6.0–8.3)

## 2012-09-19 LAB — PSA: PSA: 0.01 ng/mL (ref <=4.00)

## 2012-09-26 ENCOUNTER — Telehealth: Payer: Self-pay | Admitting: *Deleted

## 2012-09-26 NOTE — Telephone Encounter (Signed)
Needs appt

## 2012-09-26 NOTE — Telephone Encounter (Signed)
Patient scheduled for appointment.

## 2012-09-26 NOTE — Telephone Encounter (Signed)
Pt called and stated that he has been coughing up green phlegm and wanted to know if an ABX can be sent to his pharm. He was seen for the same thing in July and states that he forgot to mention this to Dr. Linford Arnold when he came in for his cpe. He denies fever/chills.Loralee Pacas Craig

## 2012-09-29 ENCOUNTER — Encounter: Payer: Self-pay | Admitting: Family Medicine

## 2012-09-29 ENCOUNTER — Ambulatory Visit (INDEPENDENT_AMBULATORY_CARE_PROVIDER_SITE_OTHER): Payer: Medicare Other | Admitting: Family Medicine

## 2012-09-29 VITALS — BP 108/68 | HR 66 | Temp 97.5°F | Wt 152.0 lb

## 2012-09-29 DIAGNOSIS — J441 Chronic obstructive pulmonary disease with (acute) exacerbation: Secondary | ICD-10-CM

## 2012-09-29 DIAGNOSIS — M549 Dorsalgia, unspecified: Secondary | ICD-10-CM

## 2012-09-29 DIAGNOSIS — G8929 Other chronic pain: Secondary | ICD-10-CM

## 2012-09-29 DIAGNOSIS — J019 Acute sinusitis, unspecified: Secondary | ICD-10-CM

## 2012-09-29 MED ORDER — TRAMADOL HCL 50 MG PO TABS: 50.0000 mg | ORAL_TABLET | Freq: Two times a day (BID) | ORAL | Status: DC | PRN

## 2012-09-29 MED ORDER — PREDNISONE 20 MG PO TABS: ORAL_TABLET | ORAL | Status: DC

## 2012-09-29 MED ORDER — FLUTICASONE FUROATE-VILANTEROL 100-25 MCG/INH IN AEPB: 1.0000 | INHALATION_SPRAY | Freq: Every day | RESPIRATORY_TRACT | Status: DC

## 2012-09-29 MED ORDER — DOXYCYCLINE HYCLATE 100 MG PO TABS: 100.0000 mg | ORAL_TABLET | Freq: Two times a day (BID) | ORAL | Status: DC

## 2012-09-29 NOTE — Patient Instructions (Signed)

## 2012-09-29 NOTE — Progress Notes (Signed)
Subjective:    Patient ID: Willie Ward, male    DOB: [birth year], 72 y.o.   MRN: [id number]  HPI  Cough x 1 months with thick bright green sputum.  + bad taste in her mouth. No fever or chills.  Using his Virgel Bouquet and turdorza and doing well with those. Marland Kitchen Also bilat sinus congestion. No ST or ear pain or pressure.  + feels more SOB with acitivity. Hx of COPD. he recently had a COPD exacerbation had put him on antibiotics. He says he feels that he hasn't completely 100% recovered from that episode. He got somewhat better and then started to slowly get worse again.   Review of Systems BP 108/68  Pulse 66  Temp(Src) 97.5 F (36.4 C) (Oral)  Wt 152 lb (68.947 kg)  BMI 21.81 kg/m2    Allergies  Allergen Reactions  . Benzodiazepines Other (See Comments)    Repeated history of taking them inappropriately and having occurrences of withdrawal seizures.  . Tape Other (See Comments)    Peels skin    Past Medical History  Diagnosis Date  . Chronic back pain   . Prostate cancer   . Depression   . COPD (chronic obstructive pulmonary disease)   . Shortness of breath   . Hypertension   . Stroke   . 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 Fracture of thumb, left, closed 01/2012    Past Surgical History  Procedure Laterality Date  . Cataract extraction, bilateral  02/2011    Dr. Lavona Mound.   . 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 Lumbar spine surgery  1980s    L4-L5    History   Social History  . Marital Status: Single    Spouse Name: N/A    Number of Children: N/A  . Years of Education: GED   Occupational History  . Disabled    Social History Main Topics  . Smoking status: Current Every Day Smoker -- 2.50 packs/day    Types: Cigarettes    Last Attempt to Quit: 08/20/2010   . Smokeless tobacco: Not on file  . Alcohol Use: No     Comment: Stopped  . Drug Use: 0.25 per week    Special: Marijuana     Comment: Uses once per week.  Marland Kitchen Sexual Activity: No   Other Topics Concern  . Not on file   Social History Narrative   1 caffeine drink per day. No exercise. Former alcoholic    Family History  Problem Relation Age of Onset  . Heart attack Mother   . Depression Son   . Diabetes Sister   . Hypertension Mother   . Hypertension Brother     Outpatient Encounter Prescriptions as of 09/29/2012  Medication Sig Dispense Refill  . Aclidinium Bromide (TUDORZA PRESSAIR) 400 MCG/ACT AEPB Inhale 1 puff into the lungs 2 (two) times daily.  1 each  3  . AMBULATORY NON FORMULARY MEDICATION Medication Name:  hospital bed. Dx:  Generalized weakness with history of multiple falls and COPD  1 Units  0  . aspirin 81 MG tablet Take 81 mg by mouth daily.      . DULoxetine (CYMBALTA) 60 MG capsule Take 1 capsule (60 mg total) by mouth daily.  30 capsule  1  . Fluticasone  Furoate-Vilanterol (BREO ELLIPTA) 100-25 MCG/INH AEPB Inhale 1 Inhaler into the lungs daily.  28 each  5  . gabapentin (NEURONTIN) 800 MG tablet TAKE 1 TABLET BY MOUTH THREE TIMES DAILY  90 tablet  0  . hydrochlorothiazide (HYDRODIURIL) 25 MG tablet Take 1 tablet (25 mg total) by mouth daily.  30 tablet  11  . lisinopril (PRINIVIL,ZESTRIL) 40 MG tablet TAKE 1 TABLET BY MOUTH EVERY DAY  30 tablet  3  . metoprolol succinate (TOPROL-XL) 50 MG 24 hr tablet TAKE 1 TABLET BY MOUTH DAILY  30 tablet  4  . Multiple Vitamin (MULTIVITAMIN) tablet Take 1 tablet by mouth daily.      Marland Kitchen PARoxetine (PAXIL) 20 MG tablet Take 1 tablet (20 mg total) by mouth every morning.  30 tablet  1  . traZODone (DESYREL) 150 MG tablet TAKE 1-2 TABLETS BY MOUTH AT BEDTIME  60 tablet  1  . VENTOLIN HFA 108 (90 BASE) MCG/ACT inhaler INHALE 2 PUFFS INTO THE LUNGS EVERY 6 HOURS AS NEEDED FOR WHEEZING  18 g  0  . [DISCONTINUED] Fluticasone  Furoate-Vilanterol (BREO ELLIPTA) 100-25 MCG/INH AEPB Inhale 1 Inhaler into the lungs daily.  28 each  0  . [DISCONTINUED] HYDROcodone-acetaminophen (NORCO/VICODIN) 5-325 MG per tablet Take 2 tablets by mouth daily as needed for pain.  30 tablet  0  . doxycycline (VIBRA-TABS) 100 MG tablet Take 1 tablet (100 mg total) by mouth 2 (two) times daily.  20 tablet  0  . predniSONE (DELTASONE) 20 MG tablet 2 a day x 5 days, then 1 a day x 5 day  15 tablet  0  . traMADol (ULTRAM) 50 MG tablet Take 1 tablet (50 mg total) by mouth 2 (two) times daily as needed for pain.  60 tablet  0   No facility-administered encounter medications on file as of 09/29/2012.          Objective:   Physical Exam  Constitutional: He is oriented to person, place, and time. He appears well-developed and well-nourished.  HENT:  Head: Normocephalic and atraumatic.  Right Ear: External ear normal.  Left Ear: External ear normal.  Nose: Nose normal.  Mouth/Throat: Oropharynx is clear and moist.  TMs and canals are clear.   Eyes: Conjunctivae and EOM are normal. Pupils are equal, round, and reactive to light.  Neck: Neck supple. No thyromegaly present.  Cardiovascular: Normal rate and normal heart sounds.   Pulmonary/Chest: Effort normal. He has wheezes.  Mild expiratory wheeze bilat. Poor air movement  Lymphadenopathy:    He has no cervical adenopathy.  Neurological: He is alert and oriented to person, place, and time.  Skin: Skin is warm and dry.  Psychiatric: He has a normal mood and affect.          Assessment & Plan:  COPD exacerbation - Will tx withe Doxy, prednisone. Samples of Breo given. New rx given.  He is still on the [place] as well .  Call if not better if one week.   Acute sinusitis - will treat with doxycycline. If she's not better in one week then please call the office back. Call if any concerns. Make sure staying well hydrated. Handout given.  He does have a followup scheduled for later in  September.  Chronic back pain-he want to discontinue the hydrocodone. He says it makes him feel loopy. We will try tramadol and its place. We'll limit quantities.

## 2012-10-08 ENCOUNTER — Other Ambulatory Visit: Payer: Self-pay | Admitting: Family Medicine

## 2012-10-09 ENCOUNTER — Ambulatory Visit (INDEPENDENT_AMBULATORY_CARE_PROVIDER_SITE_OTHER): Payer: Medicare Other | Admitting: Psychiatry

## 2012-10-09 ENCOUNTER — Encounter (HOSPITAL_COMMUNITY): Payer: Self-pay | Admitting: Psychiatry

## 2012-10-09 VITALS — BP 106/67 | HR 57 | Ht 70.0 in | Wt 152.0 lb

## 2012-10-09 DIAGNOSIS — F1021 Alcohol dependence, in remission: Secondary | ICD-10-CM

## 2012-10-09 DIAGNOSIS — F1020 Alcohol dependence, uncomplicated: Secondary | ICD-10-CM

## 2012-10-09 DIAGNOSIS — F1210 Cannabis abuse, uncomplicated: Secondary | ICD-10-CM

## 2012-10-09 MED ORDER — PAROXETINE HCL 10 MG PO TABS: 10.0000 mg | ORAL_TABLET | ORAL | Status: DC

## 2012-10-09 MED ORDER — DULOXETINE HCL 60 MG PO CPEP: 60.0000 mg | ORAL_CAPSULE | Freq: Every day | ORAL | Status: DC

## 2012-10-09 MED ORDER — TRAZODONE HCL 150 MG PO TABS: 150.0000 mg | ORAL_TABLET | Freq: Every day | ORAL | Status: DC
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
## 2012-10-12 ENCOUNTER — Other Ambulatory Visit: Payer: Self-pay | Admitting: Sports Medicine

## 2012-10-14 ENCOUNTER — Ambulatory Visit: Payer: Self-pay | Admitting: Family Medicine

## 2012-10-24 ENCOUNTER — Ambulatory Visit: Payer: Self-pay | Admitting: Family Medicine

## 2012-10-30 ENCOUNTER — Encounter: Payer: Self-pay | Admitting: Internal Medicine

## 2012-10-30 ENCOUNTER — Ambulatory Visit: Payer: Self-pay | Admitting: Family Medicine

## 2012-12-01 ENCOUNTER — Encounter: Payer: Self-pay | Admitting: Family Medicine

## 2012-12-01 ENCOUNTER — Ambulatory Visit (INDEPENDENT_AMBULATORY_CARE_PROVIDER_SITE_OTHER): Payer: Medicare Other | Admitting: Family Medicine

## 2012-12-01 VITALS — BP 159/89 | HR 125 | Temp 98.1°F | Wt 148.0 lb

## 2012-12-01 DIAGNOSIS — N390 Urinary tract infection, site not specified: Secondary | ICD-10-CM

## 2012-12-01 DIAGNOSIS — Z09 Encounter for follow-up examination after completed treatment for conditions other than malignant neoplasm: Secondary | ICD-10-CM

## 2012-12-01 DIAGNOSIS — J441 Chronic obstructive pulmonary disease with (acute) exacerbation: Secondary | ICD-10-CM

## 2012-12-01 DIAGNOSIS — W19XXXA Unspecified fall, initial encounter: Secondary | ICD-10-CM

## 2012-12-01 DIAGNOSIS — F1310 Sedative, hypnotic or anxiolytic abuse, uncomplicated: Secondary | ICD-10-CM

## 2012-12-01 DIAGNOSIS — E871 Hypo-osmolality and hyponatremia: Secondary | ICD-10-CM

## 2012-12-01 DIAGNOSIS — F1110 Opioid abuse, uncomplicated: Secondary | ICD-10-CM | POA: Insufficient documentation

## 2012-12-01 MED ORDER — LEVOFLOXACIN 500 MG PO TABS: 500.0000 mg | ORAL_TABLET | Freq: Every day | ORAL | Status: DC

## 2012-12-01 NOTE — Progress Notes (Signed)
CC: Willie Ward is a 72 y.o. male is here for hospital f/u   Subjective: HPI:  Hospital followup for admission from September 29-September 26 afterwards he was at [facility] rehabilitation for a month receiving physical therapy.  [facility] come out to his house tomorrow for a safety eval and continuation of physical therapy.  Records indicate that he presented to [facility] on the 23rd with complaints of hematuria and dysuria, he was also concerned that he was experiencing withdrawal symptoms from narcotics which he was taking up to 10 pills a day of, he presented to the emergency room 2 days after stopping this and was complaining of tremor.  It appears that he urinary tract infection which grew out Proteus which was pansensitive he received a total of one week of antibiotics which was eventually changed to Cipro that he completed at rehabilitation.  Since his hospitalization he denies blood in urine, dysuria, urinary frequency hesitancy or urgency.  Based on progress notes it appears that he had very minimal narcotic withdrawal symptoms. He has not been on any narcotics ever since his presentation at the emergency room. Denies alcohol or any recreational drug use currently. He denies rapid heartbeat, diarrhea, but does have some mild nausea since Saturday of this weekend.  During his hospitalization his sodium remained around 131 similar to emergency room visits and hospitalizations earlier in September.  He denies any motor or sensory disturbances currently nor any confusion.  It also appears during September he had an emergency room visit for falls. Since he's been home he denies any falling or close calls. He returned home on Friday night. He denies any joint or muscular pain currently.  The patient's only complaint today is a productive cough that has been worsening since he returned home on Friday. It is producing thick green mucus without  blood in it. He denies shortness of breath but cough has worsened from baseline. There is been accompanied by subjective fevers and chills along with night sweats.  He has not been able to fill any of his medications due to transportation issues, he has prescriptions with him today for prednisone, Ventolin, brio, and tudorza.   Review Of Systems Outlined In HPI  Past Medical History  Diagnosis Date  . Chronic back pain   . Prostate cancer   . Depression   . COPD (chronic obstructive pulmonary disease)   . Shortness of breath   . Hypertension   . Stroke   . 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 Fracture of thumb, left, closed 01/2012     Family History  Problem Relation Age of Onset  . Heart attack Mother   . Depression Son   . Diabetes Sister   . Hypertension Mother   . Hypertension Brother      History  Substance Use Topics  . Smoking status: Current Every Day Smoker -- 2.00 packs/day    Types: Cigarettes    Last Attempt to Quit: 08/20/2010  . Smokeless tobacco: Not on file  . Alcohol Use: No     Comment: Stopped     Objective: Filed Vitals:   12/01/12 1347  BP: 159/89  Pulse: 125  Temp: 98.1 F (36.7 C)    General: Alert and Oriented, No Acute Distress HEENT: Pupils equal, round, reactive to light. Conjunctivae clear.  External ears unremarkable, canals clear with intact TMs with appropriate landmarks.  Middle ear appears open without effusion.  Pink inferior turbinates.  Moist mucous membranes, pharynx without inflammation nor lesions however moderate cobblestoning.  Neck supple without palpable lymphadenopathy nor abnormal masses. Lungs: Trace end expiratory wheezing in the left lung fields, mild central rhonchi no rales or signs of consolidation.  Comfortable work of breathing. Good air movement. Cardiac: Regular rate and rhythm. Normal S1/S2.  No murmurs, rubs, nor gallops.   Extremities: No peripheral edema.  Strong peripheral pulses.  Mental Status: No  depression, anxiety, nor agitation. Skin: Warm and dry.  Assessment & Plan: [name] was seen today for hospital f/u.  Diagnoses and associated orders for this visit:  Hospital discharge follow-up  UTI (urinary tract infection)  Narcotic abuse  Falls, initial encounter  COPD exacerbation - levofloxacin (LEVAQUIN) 500 MG tablet; Take 1 tablet (500 mg total) by mouth daily.  Hyponatremia    UTI: I've asked the patient to provide [place] with a urine sample for urinalysis and culture, he declines Falls: Stable, I've asked him to let me know if [facility] not come out to see him this week because I want to ensure that he has a home safety evaluation COPD exacerbation: Start Levaquin, strongly encouraged to get his prednisone and inhalers today he was given samples of brio and tudorza today although he does have perceptions for these that he shows me today Hyponatremia: Patient was urged to get basic metabolic panel, he declines  Signs and symptoms requring emergent/urgent reevaluation were discussed with the patient.   Return in about 4 weeks (around 12/29/2012) for PCP FU.

## 2012-12-02 ENCOUNTER — Telehealth: Payer: Self-pay | Admitting: *Deleted

## 2012-12-02 ENCOUNTER — Ambulatory Visit: Payer: Self-pay | Admitting: Family Medicine

## 2012-12-02 NOTE — Telephone Encounter (Signed)
Randa Evens, RN from [facility] is asking for VO for 2x a week for 2 weeks, 1x for 1 week for medication management/teaching & fall prevention teaching.  She also wanted you to know this morning his bp was 70/42 and by the end of the assessment it was 82/50.

## 2012-12-02 NOTE — Telephone Encounter (Signed)
Okay for home health order

## 2012-12-03 NOTE — Telephone Encounter (Signed)
Left VO on vm.

## 2012-12-11 ENCOUNTER — Telehealth: Payer: Self-pay | Admitting: *Deleted

## 2012-12-11 NOTE — Telephone Encounter (Signed)
Willie Ward from [facility] called this morning to inform you that they have made multiple attempts to contact pt to meet with him & all attempts have been unsuccessful.  She states that they won't be able to provide him services.

## 2012-12-12 ENCOUNTER — Encounter: Payer: Self-pay | Admitting: Family Medicine

## 2012-12-12 ENCOUNTER — Ambulatory Visit (INDEPENDENT_AMBULATORY_CARE_PROVIDER_SITE_OTHER): Payer: Medicare Other | Admitting: Family Medicine

## 2012-12-12 VITALS — BP 157/86 | HR 100 | Temp 98.0°F | Wt 158.0 lb

## 2012-12-12 DIAGNOSIS — S0101XD Laceration without foreign body of scalp, subsequent encounter: Secondary | ICD-10-CM

## 2012-12-12 DIAGNOSIS — I10 Essential (primary) hypertension: Secondary | ICD-10-CM

## 2012-12-12 DIAGNOSIS — I959 Hypotension, unspecified: Secondary | ICD-10-CM

## 2012-12-12 DIAGNOSIS — I2699 Other pulmonary embolism without acute cor pulmonale: Secondary | ICD-10-CM

## 2012-12-12 DIAGNOSIS — Z5189 Encounter for other specified aftercare: Secondary | ICD-10-CM

## 2012-12-12 DIAGNOSIS — Z23 Encounter for immunization: Secondary | ICD-10-CM

## 2012-12-12 DIAGNOSIS — F1020 Alcohol dependence, uncomplicated: Secondary | ICD-10-CM

## 2012-12-12 DIAGNOSIS — N179 Acute kidney failure, unspecified: Secondary | ICD-10-CM

## 2012-12-12 DIAGNOSIS — J449 Chronic obstructive pulmonary disease, unspecified: Secondary | ICD-10-CM

## 2012-12-12 MED ORDER — ALBUTEROL SULFATE HFA 108 (90 BASE) MCG/ACT IN AERS: 2.0000 | INHALATION_SPRAY | Freq: Four times a day (QID) | RESPIRATORY_TRACT | Status: DC | PRN
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
## 2012-12-19 LAB — LIPID PANEL
HDL: 68 mg/dL (ref 35–70)
LDL Cholesterol: 109 mg/dL

## 2012-12-19 LAB — HEPATIC FUNCTION PANEL
ALT: 17 U/L (ref 10–40)
AST: 12 U/L — AB (ref 14–40)
Alkaline Phosphatase: 68 U/L (ref 25–125)

## 2012-12-19 LAB — VITAMIN B12
Hep C Virus Ab: NEGATIVE
Occult Blood: POSITIVE
Vitamin B-12: 542

## 2012-12-19 LAB — TSH: TSH: 0.9 u[IU]/mL (ref 0.41–5.90)

## 2012-12-19 LAB — CBC AND DIFFERENTIAL: Hemoglobin: 11.7 g/dL — AB (ref 13.5–17.5)

## 2012-12-22 ENCOUNTER — Telehealth: Payer: Self-pay | Admitting: *Deleted

## 2012-12-22 NOTE — Telephone Encounter (Signed)
Called to get services restarted for pt. Willie Ward is asking for the d/c records from hospital..Willie Ward, [name]

## 2012-12-23 ENCOUNTER — Ambulatory Visit (INDEPENDENT_AMBULATORY_CARE_PROVIDER_SITE_OTHER): Payer: Medicare Other | Admitting: Family Medicine

## 2012-12-23 ENCOUNTER — Encounter: Payer: Self-pay | Admitting: Family Medicine

## 2012-12-23 VITALS — BP 140/74 | HR 66 | Wt 160.0 lb

## 2012-12-23 DIAGNOSIS — F1020 Alcohol dependence, uncomplicated: Secondary | ICD-10-CM

## 2012-12-23 DIAGNOSIS — I2699 Other pulmonary embolism without acute cor pulmonale: Secondary | ICD-10-CM

## 2012-12-23 DIAGNOSIS — I10 Essential (primary) hypertension: Secondary | ICD-10-CM

## 2012-12-23 DIAGNOSIS — J449 Chronic obstructive pulmonary disease, unspecified: Secondary | ICD-10-CM

## 2012-12-23 MED ORDER — DULOXETINE HCL 30 MG PO CPEP: 30.0000 mg | ORAL_CAPSULE | Freq: Every day | ORAL | Status: DC

## 2012-12-23 MED ORDER — AMBULATORY NON FORMULARY MEDICATION: Status: AC

## 2012-12-23 NOTE — Progress Notes (Signed)
  Subjective:    Patient ID: Willie Ward, male    DOB: [birth year], 72 y.o.   MRN: [id number]  HPI F/U acute PE.  Has been really SOB with acitivity.  Has been using his albuterol inhaler prn.  Still on the xaralto. Has been having some left sided CP intermittantly. Says deep breath triggers it. Last for 3-6 seconds.  Swelling in the left leg is much better.  Cough, severe, dry.  No fever, chills or sweats.    ON cymbalta for mood and back pain - says has been more irritable on the medication. Would like to stop it.  Says doesn't really think it is doing a lot for him.   Has been more anxious since being home from recent hospitalization.  HTN - restart home blood pressure medications. No chest pain. Has been having shortness of breath as described above.  He is going to the [place] tomorrow.  Review of Systems     Objective:   Physical Exam  Constitutional: He is oriented to person, place, and time. He appears well-developed and well-nourished.  HENT:  Head: Normocephalic and atraumatic.  Cardiovascular: Normal rate, regular rhythm and normal heart sounds.   Pulmonary/Chest: Effort normal and breath sounds normal.  Neurological: He is alert and oriented to person, place, and time.  Skin: Skin is warm and dry.  Psychiatric: He has a normal mood and affect. His behavior is normal.          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 We will do oxygen at rest and with ambulation today and see if we need to oxygen on him. Continue Xarelto. Sternum is extremely important to take this around her to help resolve the pulmonary embolism.  GAD - will wean cymbalta. Decrease to 30 mg for one week and then 30 mg every other day for one week then stop the medication. If he feels like it was more helpful to be on it we can always restart it. Otherwise we'll continue to monitor without the  medication. He is taking Paxil and we can certainly adjust the dose on that if needed.  Moderate COPD-will check oxygen today. We'll also see if we have any samples of Breo or tudorza. 89% with ambulation.  Encouraged him to be active.   He doesn't qualifyf or oxygen.  Have home health do overnight oxymetry.    HTN - well controlled.

## 2012-12-25 ENCOUNTER — Telehealth: Payer: Self-pay | Admitting: *Deleted

## 2012-12-25 NOTE — Telephone Encounter (Addendum)
[facility] called and states they do not do pulse oximetry so an order would need to be  Put in with a different home health agency. Also Randa Evens from [facility] states she needed clarification on medications this patient was taking. Her phone number is [phone]

## 2012-12-26 ENCOUNTER — Other Ambulatory Visit: Payer: Self-pay

## 2012-12-26 DIAGNOSIS — F1020 Alcohol dependence, uncomplicated: Secondary | ICD-10-CM

## 2012-12-26 MED ORDER — LISINOPRIL 40 MG PO TABS: 40.0000 mg | ORAL_TABLET | Freq: Every day | ORAL | Status: DC

## 2012-12-26 MED ORDER — GABAPENTIN 800 MG PO TABS: 800.0000 mg | ORAL_TABLET | Freq: Three times a day (TID) | ORAL | Status: DC

## 2012-12-26 MED ORDER — METOPROLOL SUCCINATE ER 50 MG PO TB24: 50.0000 mg | ORAL_TABLET | Freq: Every day | ORAL | Status: DC

## 2012-12-26 MED ORDER — TRAMADOL HCL 50 MG PO TABS: 50.0000 mg | ORAL_TABLET | Freq: Two times a day (BID) | ORAL | Status: DC | PRN

## 2012-12-26 MED ORDER — TRAZODONE HCL 150 MG PO TABS: 150.0000 mg | ORAL_TABLET | Freq: Every day | ORAL | Status: DC

## 2012-12-26 MED ORDER — DULOXETINE HCL 30 MG PO CPEP: 30.0000 mg | ORAL_CAPSULE | Freq: Every day | ORAL | Status: DC

## 2012-12-26 NOTE — Telephone Encounter (Signed)
Called Willie Ward informing her that I would fax a copy of med list to [facility] [phone].Loralee Pacas [place]

## 2012-12-29 ENCOUNTER — Other Ambulatory Visit: Payer: Self-pay

## 2012-12-29 MED ORDER — RIVAROXABAN 15 MG PO TABS: 15.0000 mg | ORAL_TABLET | Freq: Two times a day (BID) | ORAL | Status: DC

## 2013-01-07 ENCOUNTER — Ambulatory Visit (INDEPENDENT_AMBULATORY_CARE_PROVIDER_SITE_OTHER): Payer: Medicare Other | Admitting: Family Medicine

## 2013-01-07 ENCOUNTER — Telehealth: Payer: Self-pay | Admitting: *Deleted

## 2013-01-07 ENCOUNTER — Encounter: Payer: Self-pay | Admitting: Family Medicine

## 2013-01-07 VITALS — BP 121/67 | HR 85 | Temp 97.0°F | Wt 149.0 lb

## 2013-01-07 DIAGNOSIS — J4489 Other specified chronic obstructive pulmonary disease: Secondary | ICD-10-CM

## 2013-01-07 DIAGNOSIS — I2699 Other pulmonary embolism without acute cor pulmonale: Secondary | ICD-10-CM

## 2013-01-07 DIAGNOSIS — G8929 Other chronic pain: Secondary | ICD-10-CM

## 2013-01-07 DIAGNOSIS — R195 Other fecal abnormalities: Secondary | ICD-10-CM

## 2013-01-07 DIAGNOSIS — M545 Low back pain, unspecified: Secondary | ICD-10-CM

## 2013-01-07 DIAGNOSIS — J449 Chronic obstructive pulmonary disease, unspecified: Secondary | ICD-10-CM

## 2013-01-07 DIAGNOSIS — E559 Vitamin D deficiency, unspecified: Secondary | ICD-10-CM

## 2013-01-07 MED ORDER — TRAMADOL HCL 50 MG PO TABS: 50.0000 mg | ORAL_TABLET | Freq: Three times a day (TID) | ORAL | Status: DC

## 2013-01-07 MED ORDER — LEVOFLOXACIN 500 MG PO TABS: 500.0000 mg | ORAL_TABLET | Freq: Every day | ORAL | Status: AC

## 2013-01-07 MED ORDER — RIVAROXABAN 20 MG PO TABS: 20.0000 mg | ORAL_TABLET | Freq: Two times a day (BID) | ORAL | Status: DC

## 2013-01-07 NOTE — Telephone Encounter (Signed)
PA approved for xarelto until 01-06-14.

## 2013-01-07 NOTE — Patient Instructions (Signed)
Recommend start vitamin D, 1000 international units daily. This is over-the-counter. We will recheck her vitamin D level in 2-3 months. Please complete the antibiotic.

## 2013-01-07 NOTE — Progress Notes (Signed)
   Subjective:    Patient ID: Willie Ward, male    DOB: 07/07/1940, 72 y.o.   MRN: [id number]  HPI PE- On xaralto.    Moderate COPD - he is still short of breath since having a pulmonary embolism but increased short of breath. No fevers chills or sweats. He does feel the cough is slightly worse and he now has productive brown tinged sputum. He still has a discomfort on the right lower side of the chest that has been there since the diagnosis of pulmonary embolism.  He did go to the [place] recently and had his blood work drawn there. His vitamin D level was low and he had Hemoccult card that was positive. They recommended vitamin D supplement I want to know what I recommended. He has also been referred to the [facility] in [place] for a screening colonoscopy. He wants to stay here locally and not travel. He has to get a ride as he does not drive.  Chronic low back pain-he is asking today if I can increase the tramadol to 3 times a day. He says it is helpful but wears off and really would like to be able to take a third time during the day.  Review of Systems     Objective:   Physical Exam  Constitutional: He is oriented to person, place, and time. He appears well-developed and well-nourished.  HENT:  Head: Normocephalic and atraumatic.  Cardiovascular: Normal rate, regular rhythm and normal heart sounds.   Pulmonary/Chest: Effort normal and breath sounds normal.  Neurological: He is alert and oriented to person, place, and time.  Skin: Skin is warm and dry.  Psychiatric: He has a normal mood and affect. His behavior is normal.          Assessment & Plan:  Pulmonary embolism. He has completed the initial 21 days of 50 mg twice a day Xarelto. We'll increase to 20 mg Xarelto daily. Prescription sent to pharmacy and samples given. Followup in one month.  Moderate COPD-I. think he is having an exacerbation. He has increased cough with brown sputum production. He has had some shortness of  breath but it has been persistent since having the PE. Has not changed or worsened. He has not any fevers chills or sweats. We will go ahead and place him on Levaquin. Call if not improving. Call sooner if suddenly getting worse.  Vitamin D deficiency-recommend starting 1000 international units daily. You're to get 600 international units and his daily vitamin.  Chronic low back pain-I will increase his tramadol to 3 times a day but warned about potential for seizures. 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 I want to monitor this very carefully. He does not drive.  Heme positive stools-he would like to be referred to a local GI office for this. He needs followup colonoscopy. Referral placed.

## 2013-01-08 ENCOUNTER — Encounter: Payer: Self-pay | Admitting: *Deleted

## 2013-01-14 ENCOUNTER — Telehealth: Payer: Self-pay | Admitting: *Deleted

## 2013-01-14 ENCOUNTER — Telehealth: Payer: Self-pay

## 2013-01-14 ENCOUNTER — Other Ambulatory Visit: Payer: Self-pay | Admitting: Family Medicine

## 2013-01-14 NOTE — Telephone Encounter (Signed)
Patient is having blood in stool, blood in urine and brushing easy. His Xarelto was increased on the 3 rd of December. Please advise.

## 2013-01-14 NOTE — Telephone Encounter (Signed)
Patient scheduled to come in

## 2013-01-14 NOTE — Telephone Encounter (Signed)
Please make sure he is not taking it twice a day. He was previously taking 15 mg twice a day for total of 30 mg daily. I changed him to 20 mg once a day. I tried to reiterate this to him to the visit but did make me worried that he might go home and take it twice a day. If he is taking it properly once a day, then have him stop the medication and come to see me tomorrow.

## 2013-01-15 ENCOUNTER — Encounter: Payer: Self-pay | Admitting: Family Medicine

## 2013-01-15 ENCOUNTER — Telehealth: Payer: Self-pay | Admitting: Family Medicine

## 2013-01-15 ENCOUNTER — Ambulatory Visit (INDEPENDENT_AMBULATORY_CARE_PROVIDER_SITE_OTHER): Payer: Medicare Other | Admitting: Family Medicine

## 2013-01-15 ENCOUNTER — Ambulatory Visit (INDEPENDENT_AMBULATORY_CARE_PROVIDER_SITE_OTHER): Payer: Medicare Other

## 2013-01-15 VITALS — BP 83/47 | HR 90 | Temp 99.0°F | Wt 148.0 lb

## 2013-01-15 DIAGNOSIS — R0602 Shortness of breath: Secondary | ICD-10-CM

## 2013-01-15 DIAGNOSIS — J449 Chronic obstructive pulmonary disease, unspecified: Secondary | ICD-10-CM

## 2013-01-15 DIAGNOSIS — R319 Hematuria, unspecified: Secondary | ICD-10-CM

## 2013-01-15 DIAGNOSIS — Z0289 Encounter for other administrative examinations: Secondary | ICD-10-CM

## 2013-01-15 DIAGNOSIS — I2699 Other pulmonary embolism without acute cor pulmonale: Secondary | ICD-10-CM

## 2013-01-15 DIAGNOSIS — R042 Hemoptysis: Secondary | ICD-10-CM

## 2013-01-15 NOTE — Patient Instructions (Signed)
Keep follow up appt.

## 2013-01-15 NOTE — Progress Notes (Signed)
   Subjective:    Patient ID: Willie Ward, male    DOB: 1940-02-08, 72 y.o.   MRN: [id number]  HPI Says started noticing blood in the urine early in the morning and noticing some blood when coughs.  Urine has looked cloudy as well.  No blood in the bowel movements.  Has been bruising easily.  Taking the 20mg  xaralto once a day.  Started it 12/07/12. Still feels SOB with activity.   Pulmonary embolism. He still having some pain on the right side of his chest and underneath her rib cage. He still has significant shortness of breath with activity. He still continues to smoke though today he is chewing  nicotine gum. I treated him for COPD exacerbation about a week ago with Levaquin and prednisone. He says it is helped some but not completely. No fevers chills or sweats. Noticing some blood with cough.  Review of Systems     Objective:   Physical Exam  Constitutional: He is oriented to person, place, and time. He appears well-developed and well-nourished.  HENT:  Head: Normocephalic and atraumatic.  Right Ear: External ear normal.  Left Ear: External ear normal.  Nose: Nose normal.  Mouth/Throat: Oropharynx is clear and moist.  TMs and canals are clear.   Eyes: Conjunctivae and EOM are normal. Pupils are equal, round, and reactive to light.  Neck: Neck supple. No thyromegaly present.  Cardiovascular: Normal rate and normal heart sounds.   Pulmonary/Chest: Effort normal and breath sounds normal.  Lymphadenopathy:    He has no cervical adenopathy.  Neurological: He is alert and oriented to person, place, and time.  Skin: Skin is warm and dry.  Psychiatric: He has a normal mood and affect.          Assessment & Plan:  COPD-I. had patient to walk test to see if he might qualify for oxygen. We have done this about a month ago and he was borderline low. He became short of breath with activity and had some tightness in his chest so we did a EKG. EKG showed a rate of 75 beats per minute,  normal sinus rhythm with no acute changes. Just tachycardia. It is difficult to say if the short of breath is related to the tachycardia because of the strain on his heart, pulmonary embolism or if he may have something going on with his heart. Like to consider referral to cardiology for further evaluation. It may also just take time for him to recover from the pulmonary embolism.  PE-he's only been on Xarelto for about 4 weeks at this point. Minimum treatment is at least 3 months. He is having some blood in the urine somewhat like to collect a sample to see if it is gross blood with hold right blood cells or if he may have a infection that could be causing his symptoms.  He has also had cough with occasional blood-tinged sputum. We'll get chest x-ray today.  Hematuria-will check urinalysis. He was unable to give a sample in the office today. Gave him a cup to take home we'll collect a sample. Need to check for infection.  Check creatinine as well.  Tachycardia-he is slightly tachycardic today. Heart rate 125 beats per minute, sinus rhythm with normal axis. No Q waves or significant ST-T wave changes.

## 2013-01-15 NOTE — Progress Notes (Signed)
   Subjective:    Patient ID: Willie Ward, male    DOB: [birth year], 72 y.o.   MRN: [id number]  HPI    Review of Systems     Objective:   Physical Exam        Assessment & Plan:  y

## 2013-01-15 NOTE — Telephone Encounter (Signed)
Pt called and wants to speak to [facility] about chest xray, Ekg and his visit with Dr. Linford Arnold

## 2013-01-15 NOTE — Telephone Encounter (Signed)
Called pt and informed him to go to lab and give urine sample and blood. He voiced understanding and agreed.Loralee Pacas [place]

## 2013-01-16 ENCOUNTER — Telehealth: Payer: Self-pay | Admitting: *Deleted

## 2013-01-16 NOTE — Telephone Encounter (Signed)
PT states that when she went out today to work with the pt his bp was 88/48 hr 115 at rest.  She gave him 10 oz of water & after about 10 min his pb was 108/48 hr 108.

## 2013-01-16 NOTE — Telephone Encounter (Signed)
Left detailed instructions on pt's vm.

## 2013-01-16 NOTE — Telephone Encounter (Signed)
Have him stop his lisinopril but keep taking the metoprolol and increase fluid intake.

## 2013-01-17 ENCOUNTER — Emergency Department (HOSPITAL_COMMUNITY): Payer: Medicare Other

## 2013-01-17 ENCOUNTER — Observation Stay (HOSPITAL_COMMUNITY)
Admission: EM | Admit: 2013-01-17 | Discharge: 2013-01-19 | Disposition: A | Discharge status: Home / Self Care | Payer: Medicare Other | Attending: Internal Medicine | Admitting: Internal Medicine

## 2013-01-17 ENCOUNTER — Encounter (HOSPITAL_COMMUNITY): Payer: Self-pay | Admitting: Emergency Medicine

## 2013-01-17 DIAGNOSIS — N189 Chronic kidney disease, unspecified: Secondary | ICD-10-CM | POA: Diagnosis present

## 2013-01-17 DIAGNOSIS — R0789 Other chest pain: Principal | ICD-10-CM | POA: Insufficient documentation

## 2013-01-17 DIAGNOSIS — E871 Hypo-osmolality and hyponatremia: Secondary | ICD-10-CM | POA: Diagnosis present

## 2013-01-17 DIAGNOSIS — R5381 Other malaise: Secondary | ICD-10-CM | POA: Insufficient documentation

## 2013-01-17 DIAGNOSIS — R0989 Other specified symptoms and signs involving the circulatory and respiratory systems: Secondary | ICD-10-CM | POA: Insufficient documentation

## 2013-01-17 DIAGNOSIS — D649 Anemia, unspecified: Secondary | ICD-10-CM | POA: Insufficient documentation

## 2013-01-17 DIAGNOSIS — F1210 Cannabis abuse, uncomplicated: Secondary | ICD-10-CM | POA: Insufficient documentation

## 2013-01-17 DIAGNOSIS — I129 Hypertensive chronic kidney disease with stage 1 through stage 4 chronic kidney disease, or unspecified chronic kidney disease: Secondary | ICD-10-CM | POA: Insufficient documentation

## 2013-01-17 DIAGNOSIS — M549 Dorsalgia, unspecified: Secondary | ICD-10-CM | POA: Insufficient documentation

## 2013-01-17 DIAGNOSIS — Z9181 History of falling: Secondary | ICD-10-CM | POA: Insufficient documentation

## 2013-01-17 DIAGNOSIS — Z7901 Long term (current) use of anticoagulants: Secondary | ICD-10-CM | POA: Insufficient documentation

## 2013-01-17 DIAGNOSIS — G4700 Insomnia, unspecified: Secondary | ICD-10-CM

## 2013-01-17 DIAGNOSIS — C61 Malignant neoplasm of prostate: Secondary | ICD-10-CM

## 2013-01-17 DIAGNOSIS — R0602 Shortness of breath: Secondary | ICD-10-CM

## 2013-01-17 DIAGNOSIS — Z79899 Other long term (current) drug therapy: Secondary | ICD-10-CM | POA: Insufficient documentation

## 2013-01-17 DIAGNOSIS — J449 Chronic obstructive pulmonary disease, unspecified: Secondary | ICD-10-CM | POA: Diagnosis present

## 2013-01-17 DIAGNOSIS — R0609 Other forms of dyspnea: Secondary | ICD-10-CM | POA: Insufficient documentation

## 2013-01-17 DIAGNOSIS — M5090 Cervical disc disorder, unspecified, unspecified cervical region: Secondary | ICD-10-CM

## 2013-01-17 DIAGNOSIS — E860 Dehydration: Secondary | ICD-10-CM | POA: Diagnosis present

## 2013-01-17 DIAGNOSIS — N183 Chronic kidney disease, stage 3 unspecified: Secondary | ICD-10-CM | POA: Insufficient documentation

## 2013-01-17 DIAGNOSIS — F17200 Nicotine dependence, unspecified, uncomplicated: Secondary | ICD-10-CM | POA: Insufficient documentation

## 2013-01-17 DIAGNOSIS — I498 Other specified cardiac arrhythmias: Secondary | ICD-10-CM | POA: Insufficient documentation

## 2013-01-17 DIAGNOSIS — D638 Anemia in other chronic diseases classified elsewhere: Secondary | ICD-10-CM | POA: Insufficient documentation

## 2013-01-17 DIAGNOSIS — F1020 Alcohol dependence, uncomplicated: Secondary | ICD-10-CM

## 2013-01-17 DIAGNOSIS — Z720 Tobacco use: Secondary | ICD-10-CM | POA: Diagnosis present

## 2013-01-17 DIAGNOSIS — G629 Polyneuropathy, unspecified: Secondary | ICD-10-CM

## 2013-01-17 DIAGNOSIS — R531 Weakness: Secondary | ICD-10-CM | POA: Diagnosis present

## 2013-01-17 DIAGNOSIS — J4489 Other specified chronic obstructive pulmonary disease: Secondary | ICD-10-CM | POA: Insufficient documentation

## 2013-01-17 DIAGNOSIS — I959 Hypotension, unspecified: Secondary | ICD-10-CM

## 2013-01-17 DIAGNOSIS — R031 Nonspecific low blood-pressure reading: Secondary | ICD-10-CM | POA: Insufficient documentation

## 2013-01-17 DIAGNOSIS — M545 Low back pain, unspecified: Secondary | ICD-10-CM

## 2013-01-17 DIAGNOSIS — F411 Generalized anxiety disorder: Secondary | ICD-10-CM

## 2013-01-17 DIAGNOSIS — G8929 Other chronic pain: Secondary | ICD-10-CM | POA: Diagnosis present

## 2013-01-17 DIAGNOSIS — Z8546 Personal history of malignant neoplasm of prostate: Secondary | ICD-10-CM

## 2013-01-17 DIAGNOSIS — N179 Acute kidney failure, unspecified: Secondary | ICD-10-CM | POA: Diagnosis present

## 2013-01-17 DIAGNOSIS — I2699 Other pulmonary embolism without acute cor pulmonale: Secondary | ICD-10-CM | POA: Diagnosis present

## 2013-01-17 DIAGNOSIS — D631 Anemia in chronic kidney disease: Secondary | ICD-10-CM | POA: Insufficient documentation

## 2013-01-17 DIAGNOSIS — I10 Essential (primary) hypertension: Secondary | ICD-10-CM | POA: Diagnosis present

## 2013-01-17 DIAGNOSIS — Z8673 Personal history of transient ischemic attack (TIA), and cerebral infarction without residual deficits: Secondary | ICD-10-CM | POA: Insufficient documentation

## 2013-01-17 DIAGNOSIS — E441 Mild protein-calorie malnutrition: Secondary | ICD-10-CM

## 2013-01-17 DIAGNOSIS — R079 Chest pain, unspecified: Secondary | ICD-10-CM

## 2013-01-17 DIAGNOSIS — F1220 Cannabis dependence, uncomplicated: Secondary | ICD-10-CM

## 2013-01-17 DIAGNOSIS — R51 Headache: Secondary | ICD-10-CM | POA: Insufficient documentation

## 2013-01-17 DIAGNOSIS — F1110 Opioid abuse, uncomplicated: Secondary | ICD-10-CM

## 2013-01-17 DIAGNOSIS — Z87898 Personal history of other specified conditions: Secondary | ICD-10-CM

## 2013-01-17 LAB — COMPREHENSIVE METABOLIC PANEL
AST: 17 U/L (ref 0–37)
Alkaline Phosphatase: 76 U/L (ref 39–117)
BUN: 34 mg/dL — ABNORMAL HIGH (ref 6–23)
CO2: 26 mEq/L (ref 19–32)
Calcium: 9.5 mg/dL (ref 8.4–10.5)
Chloride: 91 mEq/L — ABNORMAL LOW (ref 96–112)
Creatinine, Ser: 2.06 mg/dL — ABNORMAL HIGH (ref 0.50–1.35)
GFR calc Af Amer: 36 mL/min — ABNORMAL LOW (ref >90)
GFR calc non Af Amer: 31 mL/min — ABNORMAL LOW (ref >90)
Glucose, Bld: 89 mg/dL (ref 70–99)
Potassium: 4.1 mEq/L (ref 3.5–5.1)
Sodium: 127 mEq/L — ABNORMAL LOW (ref 135–145)
Total Bilirubin: 0.4 mg/dL (ref 0.3–1.2)

## 2013-01-17 LAB — URINALYSIS, ROUTINE W REFLEX MICROSCOPIC
Bilirubin Urine: NEGATIVE
Glucose, UA: NEGATIVE mg/dL
Hgb urine dipstick: NEGATIVE
Ketones, ur: NEGATIVE mg/dL
Leukocytes, UA: NEGATIVE
Nitrite: NEGATIVE
Protein, ur: NEGATIVE mg/dL
Specific Gravity, Urine: 1.009 (ref 1.005–1.030)
Urobilinogen, UA: 0.2 mg/dL (ref 0.0–1.0)
pH: 5.5 (ref 5.0–8.0)

## 2013-01-17 LAB — CBC WITH DIFFERENTIAL/PLATELET
Basophils Absolute: 0 10*3/uL (ref 0.0–0.1)
Eosinophils Relative: 2 % (ref 0–5)
HCT: 33.3 % — ABNORMAL LOW (ref 39.0–52.0)
Hemoglobin: 11.4 g/dL — ABNORMAL LOW (ref 13.0–17.0)
Lymphocytes Relative: 25 % (ref 12–46)
Lymphs Abs: 1.9 10*3/uL (ref 0.7–4.0)
MCHC: 34.2 g/dL (ref 30.0–36.0)
MCV: 100 fL (ref 78.0–100.0)
Monocytes Absolute: 1.2 10*3/uL — ABNORMAL HIGH (ref 0.1–1.0)
Monocytes Relative: 15 % — ABNORMAL HIGH (ref 3–12)
Neutro Abs: 4.5 10*3/uL (ref 1.7–7.7)
RBC: 3.33 MIL/uL — ABNORMAL LOW (ref 4.22–5.81)
RDW: 16.6 % — ABNORMAL HIGH (ref 11.5–15.5)
WBC: 7.7 10*3/uL (ref 4.0–10.5)

## 2013-01-17 LAB — RAPID URINE DRUG SCREEN, HOSP PERFORMED
Amphetamines: NOT DETECTED
Barbiturates: NOT DETECTED
Tetrahydrocannabinol: NOT DETECTED

## 2013-01-17 LAB — POCT I-STAT TROPONIN I: Troponin i, poc: 0 ng/mL (ref 0.00–0.08)

## 2013-01-17 LAB — PROTIME-INR: INR: 1.3 (ref 0.00–1.49)

## 2013-01-17 LAB — CG4 I-STAT (LACTIC ACID): Lactic Acid, Venous: 1.06 mmol/L (ref 0.5–2.2)

## 2013-01-17 MED ORDER — SODIUM CHLORIDE 0.9 % IJ SOLN
3.0000 mL | Freq: Two times a day (BID) | INTRAMUSCULAR | Status: DC
  Administered 2013-01-17: 3 mL via INTRAVENOUS

## 2013-01-17 MED ORDER — TIOTROPIUM BROMIDE MONOHYDRATE 18 MCG IN CAPS
18.0000 ug | ORAL_CAPSULE | Freq: Every day | RESPIRATORY_TRACT | Status: DC
  Administered 2013-01-18 – 2013-01-19 (×2): 18 ug via RESPIRATORY_TRACT
  Filled 2013-01-17: qty 5

## 2013-01-17 MED ORDER — ADULT MULTIVITAMIN W/MINERALS CH
1.0000 | ORAL_TABLET | Freq: Every day | ORAL | Status: DC
  Administered 2013-01-18 – 2013-01-19 (×2): 1 via ORAL
  Filled 2013-01-17 (×2): qty 1

## 2013-01-17 MED ORDER — TRAMADOL HCL 50 MG PO TABS
50.0000 mg | ORAL_TABLET | Freq: Three times a day (TID) | ORAL | Status: DC
  Administered 2013-01-17 – 2013-01-19 (×5): 50 mg via ORAL
  Filled 2013-01-17 (×5): qty 1

## 2013-01-17 MED ORDER — GABAPENTIN 400 MG PO CAPS
800.0000 mg | ORAL_CAPSULE | Freq: Three times a day (TID) | ORAL | Status: DC
  Administered 2013-01-17 – 2013-01-19 (×5): 800 mg via ORAL
  Filled 2013-01-17 (×7): qty 2

## 2013-01-17 MED ORDER — ACETAMINOPHEN 650 MG RE SUPP: 650.0000 mg | Freq: Four times a day (QID) | RECTAL | Status: DC | PRN

## 2013-01-17 MED ORDER — ALBUTEROL SULFATE (5 MG/ML) 0.5% IN NEBU
2.5000 mg | INHALATION_SOLUTION | RESPIRATORY_TRACT | Status: DC | PRN
  Filled 2013-01-17: qty 0.5

## 2013-01-17 MED ORDER — ONDANSETRON HCL 4 MG/2ML IJ SOLN: 4.0000 mg | Freq: Four times a day (QID) | INTRAMUSCULAR | Status: DC | PRN

## 2013-01-17 MED ORDER — FLUTICASONE FUROATE-VILANTEROL 100-25 MCG/INH IN AEPB: 1.0000 | INHALATION_SPRAY | Freq: Every day | RESPIRATORY_TRACT | Status: DC

## 2013-01-17 MED ORDER — RIVAROXABAN 20 MG PO TABS
20.0000 mg | ORAL_TABLET | Freq: Every day | ORAL | Status: DC
  Administered 2013-01-18 – 2013-01-19 (×2): 20 mg via ORAL
  Filled 2013-01-17 (×2): qty 1

## 2013-01-17 MED ORDER — ONE-DAILY MULTI VITAMINS PO TABS: 1.0000 | ORAL_TABLET | Freq: Every day | ORAL | Status: DC

## 2013-01-17 MED ORDER — GABAPENTIN 800 MG PO TABS
800.0000 mg | ORAL_TABLET | Freq: Three times a day (TID) | ORAL | Status: DC
  Filled 2013-01-17: qty 1

## 2013-01-17 MED ORDER — ACETAMINOPHEN 325 MG PO TABS
650.0000 mg | ORAL_TABLET | Freq: Four times a day (QID) | ORAL | Status: DC | PRN
  Administered 2013-01-19: 650 mg via ORAL
  Filled 2013-01-17: qty 2

## 2013-01-17 MED ORDER — SODIUM CHLORIDE 0.9 % IV BOLUS (SEPSIS)
500.0000 mL | Freq: Once | INTRAVENOUS | Status: AC
  Administered 2013-01-17: 500 mL via INTRAVENOUS

## 2013-01-17 MED ORDER — ONDANSETRON HCL 4 MG PO TABS: 4.0000 mg | ORAL_TABLET | Freq: Four times a day (QID) | ORAL | Status: DC | PRN

## 2013-01-17 MED ORDER — ASPIRIN 81 MG PO CHEW
324.0000 mg | CHEWABLE_TABLET | Freq: Once | ORAL | Status: AC
  Administered 2013-01-17: 324 mg via ORAL
  Filled 2013-01-17: qty 4

## 2013-01-17 MED ORDER — DULOXETINE HCL 30 MG PO CPEP
30.0000 mg | ORAL_CAPSULE | Freq: Every day | ORAL | Status: DC
  Administered 2013-01-18 – 2013-01-19 (×2): 30 mg via ORAL
  Filled 2013-01-17 (×2): qty 1

## 2013-01-17 MED ORDER — TRAZODONE HCL 150 MG PO TABS
150.0000 mg | ORAL_TABLET | Freq: Every day | ORAL | Status: DC
  Administered 2013-01-17 – 2013-01-18 (×2): 150 mg via ORAL
  Filled 2013-01-17 (×3): qty 1

## 2013-01-17 MED ORDER — SODIUM CHLORIDE 0.9 % IV SOLN
INTRAVENOUS | Status: AC
  Administered 2013-01-17 – 2013-01-18 (×2): via INTRAVENOUS

## 2013-01-17 NOTE — ED Provider Notes (Signed)
CSN: [id number]     Arrival date & time 01/17/13  1624 History   First MD Initiated Contact with Patient 01/17/13 1646     Chief Complaint  Patient presents with  . Hypotension  . Weakness   (Consider location/radiation/quality/duration/timing/severity/associated sxs/prior Treatment) HPI Comments: 72 year old male presents with multiple complaints. Patient is a poor historian overall. He complains of multiple things, for the most pertinent thing seem to be a headache that started this morning is located on his bilateral temples. He's also having worsening shortness of breath and right-sided chest pain. He states the shortness but this more severe than normal when he is walking. He is currently on xarelto so for a PE diagnosed over a month ago. He is also stating that he is having increased falls and feels generalized weakness. No focal weakness.denies any current chest pain. He is having a moderate headache at this time. He's also has been having some hemoptysis, blood in his stools (dark mixed with brown) and some hematuria intermittently. He has told his doctor as well some of this is a severe continued to go on over the past couple weeks. He also noted his blood pressure was low yesterday his PCP advised to stop taking his lisinopril.  Patient is a 72 y.o. male presenting with weakness.  Weakness Associated symptoms include chest pain, headaches and shortness of breath. Pertinent negatives include no abdominal pain.    Past Medical History  Diagnosis Date  . Chronic back pain   . Prostate cancer   . Depression   . COPD (chronic obstructive pulmonary disease)   . Shortness of breath   . Hypertension   . Stroke   . 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 Fracture of thumb, left, closed 01/2012   Past Surgical History  Procedure Laterality Date  . Cataract extraction, bilateral  02/2011    Dr. Lavona Mound.   . 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 Lumbar spine surgery  1980s    L4-L5   Family History  Problem Relation Age of Onset  . Heart attack Mother   . Depression Son   . Diabetes Sister   . Hypertension Mother   . Hypertension Brother    History  Substance Use Topics  . Smoking status: Current Every Day Smoker -- 2.00 packs/day    Types: Cigarettes    Last Attempt to Quit: 08/20/2010  . Smokeless tobacco: Not on file  . Alcohol Use: No     Comment: Stopped    Review of Systems  Constitutional: Negative for fever.  Respiratory: Positive for cough and shortness of breath.   Cardiovascular: Positive for chest pain. Negative for leg swelling.  Gastrointestinal: Positive for blood in stool. Negative for nausea, vomiting, abdominal pain, diarrhea and constipation.  Genitourinary: Positive for hematuria. Negative for dysuria.  Musculoskeletal: Negative for back pain.  Neurological: Positive for weakness and headaches. Negative for speech difficulty and light-headedness.  All other systems reviewed and are negative.    Allergies  Benzodiazepines and Tape  Home Medications   Current Outpatient Rx  Name  Route  Sig  Dispense  Refill  . Aclidinium Bromide (TUDORZA PRESSAIR) 400 MCG/ACT AEPB   Inhalation   Inhale 1 puff into the lungs 2 (two) times daily.   1 each  3   . albuterol (PROAIR HFA) 108 (90 BASE) MCG/ACT inhaler   Inhalation   Inhale 2 puffs into the lungs every 6 (six) hours as needed for wheezing or shortness of breath.   1 Inhaler   2   . AMBULATORY NON FORMULARY MEDICATION      Medication Name: overnight oxymetry for SOB and pulmonary emboli.  Uses [facility].   1 Units   0   . aspirin 81 MG tablet   Oral   Take 81 mg by mouth daily.         . DULoxetine (CYMBALTA) 30 MG capsule   Oral   Take 1 capsule (30 mg total)  by mouth daily.   15 capsule   0   . Fluticasone Furoate-Vilanterol (BREO ELLIPTA) 100-25 MCG/INH AEPB   Inhalation   Inhale 1 Inhaler into the lungs daily.   28 each   5   . gabapentin (NEURONTIN) 800 MG tablet      TAKE 1 TABLET BY MOUTH THREE TIMES DAILY   90 tablet   0   . levofloxacin (LEVAQUIN) 500 MG tablet   Oral   Take 1 tablet (500 mg total) by mouth daily.   10 tablet   0   . lisinopril (PRINIVIL,ZESTRIL) 40 MG tablet   Oral   Take 1 tablet (40 mg total) by mouth daily.   30 tablet   3   . metoprolol succinate (TOPROL-XL) 50 MG 24 hr tablet   Oral   Take 1 tablet (50 mg total) by mouth daily. Take with or immediately following a meal.   30 tablet   4   . Multiple Vitamin (MULTIVITAMIN) tablet   Oral   Take 1 tablet by mouth daily.         . Rivaroxaban (XARELTO) 20 MG TABS tablet   Oral   Take 20 mg by mouth 1 day or 1 dose.         . traMADol (ULTRAM) 50 MG tablet   Oral   Take 1 tablet (50 mg total) by mouth 3 (three) times daily.   90 tablet   0   . traZODone (DESYREL) 150 MG tablet   Oral   Take 1 tablet (150 mg total) by mouth at bedtime.   30 tablet   1     Please discontinue all previous prescriptions for  ...    BP 88/53  Pulse 110  Temp(Src) 97.8 F (36.6 C) (Oral)  Resp 22  SpO2 95% Physical Exam  Nursing note and vitals reviewed. Constitutional: He is oriented to person, place, and time. He appears well-developed and well-nourished.  HENT:  Head: Normocephalic and atraumatic.  Right Ear: External ear normal.  Left Ear: External ear normal.  Nose: Nose normal.  Eyes: EOM are normal. Pupils are equal, round, and reactive to light. Right eye exhibits no discharge. Left eye exhibits no discharge.  Neck: Neck supple.  Cardiovascular: Regular rhythm, normal heart sounds and intact distal pulses.  Tachycardia present.   Pulmonary/Chest: Effort normal. Not tachypneic. No respiratory distress. He has rhonchi in the right lower  field and the left lower field.  Abdominal: Soft. He exhibits no distension and no mass. There is no tenderness.  Musculoskeletal: He exhibits no edema.  Neurological: He is alert and oriented to person, place, and time. He has normal strength and normal reflexes. No cranial nerve deficit or sensory deficit. GCS eye subscore is 4. GCS verbal subscore is 5. GCS motor subscore is  6.  Skin: Skin is warm and dry. Ecchymosis (multiple over extremities) noted.    ED Course  Procedures (including critical care time) Labs Review Labs Reviewed  CBC WITH DIFFERENTIAL - Abnormal; Notable for the following:    RBC 3.33 (*)    Hemoglobin 11.4 (*)    HCT 33.3 (*)    MCH 34.2 (*)    RDW 16.6 (*)    Monocytes Relative 15 (*)    Monocytes Absolute 1.2 (*)    All other components within normal limits  COMPREHENSIVE METABOLIC PANEL - Abnormal; Notable for the following:    Sodium 127 (*)    Chloride 91 (*)    BUN 34 (*)    Creatinine, Ser 2.06 (*)    Albumin 3.1 (*)    GFR calc non Af Amer 31 (*)    GFR calc Af Amer 36 (*)    All other components within normal limits  PROTIME-INR - Abnormal; Notable for the following:    Prothrombin Time 15.9 (*)    All other components within normal limits  URINALYSIS, ROUTINE W REFLEX MICROSCOPIC - Abnormal; Notable for the following:    APPearance CLOUDY (*)    All other components within normal limits  CG4 I-STAT (LACTIC ACID)  OCCULT BLOOD, POC DEVICE  POCT I-STAT TROPONIN I   Imaging Review Dg Chest 2 View  01/17/2013   CLINICAL DATA:  Hypertension, weakness,  EXAM: CHEST  2 VIEW  COMPARISON:  01/15/2013  FINDINGS: The cardiac silhouette is within normal limits. Chronic and postsurgical changes identified within the left hemithorax. When compared to previous study with no new focal regions of consolidation are new focal infiltrates. No acute osseous abnormalities identified.  IMPRESSION: No active cardiopulmonary disease. Stable chronic and postsurgical  changes.   Electronically Signed   By: Salome Holmes M.D.   On: 01/17/2013 17:50   Ct Head Wo Contrast  01/17/2013   CLINICAL DATA:  Headache.  Previous cranial surgery for blood clot  EXAM: CT HEAD WITHOUT CONTRAST  TECHNIQUE: Contiguous axial images were obtained from the base of the skull through the vertex without contrast.  COMPARISON:  01/25/12  FINDINGS: Premature cerebral and cerebellar atrophy. Right temporal encephalomalacia. Previous right frontotemporal craniotomy. Chronic microvascular ischemic change.  No acute stroke or hemorrhage. No mass lesion or hydrocephalus. No extra-axial fluid. Similar appearance to priors.  IMPRESSION: No acute intracranial findings.   Electronically Signed   By: Davonna Belling M.D.   On: 01/17/2013 18:12    EKG Interpretation    Date/Time:  Saturday January 17 2013 18:14:36 EST Ventricular Rate:  117 PR Interval:  84 QRS Duration: 79 QT Interval:  470 QTC Calculation: 656 R Axis:   87 Text Interpretation:  Ectopic atrial tachycardia, unifocal Lateral leads are also involved Prolonged QT interval PR dpression Nonspecific ST and T wave abnormality Confirmed by Ziggy Chanthavong  MD, Toby Ayad ([id number]) on 01/17/2013 7:30:33 PM            MDM   1. Transient hypotension   2. Weakness    6:15 pm D/w Dr. Sanjuana Kava, who will look at EKG on computer and call back. Given ASA. When discussing he states he had CP while doing the first EKG but not currently. Was a sharp, midsternal CP.  6:30 PM Discussed again with cards, who sees significant PR depression and abnormal ST segments, but given patient's difficult history and now he is pain free and otherwise well appearing will consult the cards moonlighter at [facility] who  will eval. Given his atyipcal story (CP during EKG for a few min, no other CP today) and being on xarelto cards recommends holding on heparin and will go to cath lab if pain returns or EKG changes.   No signs of fever, stroke or infectious process. Given  his transient hypotension (resopnsive to fluids) and abnormal EKG, will admit to hospitalist.  Audree Camel, MD 01/17/13 [id number]

## 2013-01-17 NOTE — ED Notes (Signed)
Pt reports generalized weakness that began yesterday. Pt states he went to bed early and slept for 7 hours and awoke. Pt reports having difficulty ambulating to the restroom, however did not fall. Pt reports a headache and back pain. Pt BP on arrival was 88/53 with a pulse of 110. Pt states he was called to stop taking his blood pressure medications due to his Rx for Xarelto.

## 2013-01-17 NOTE — ED Notes (Signed)
CG4 Result announced to [facility]

## 2013-01-17 NOTE — ED Notes (Signed)
Troponin Result announced to [facility]

## 2013-01-17 NOTE — Consult Note (Signed)
Reason for Consult: Chest pain and abnormal EKG  Referring Physician: [name] is an 72 y.o. male.   HPI: The patient is currently seen in consultation with Dr. Criss Alvine for evaluation of chest pain and abnormal EKG.  He is a very poor historian. He has a prior history of recent diagnosis of pulmonary embolus and is currently on Xarelto for anti-coagulation for the last few months.  He reports a 2 month history of upper respiratory tract infection consisting of cough, rhinitis, hemoptysis, falls and exertional shortness of breath. He also complains of 8 weeks history of intermittent non-exertional chest pain which he describes a sharp pain that typically last for a few seconds before spontaneous relief.  The pain sometimes radiates to both arms, but he denies any nausea or vomiting or diaphoresis. He also denies any PND or orthopnea.  His EKG obtained 01/17/2013 at 18:14 showed ectopic atrial rhythm with heart rate 117 bpm. The ectopic atrial rhythm has inverted T-wave creating the appearance of PR depression and inferolateral concave-up ST-elevation.  Currently, he is completely asymptomatic, and he is hemodynamically stable. His first set of cardiac marker showed a troponin-i of 0.    He reports that he has been having hemoptysis, hematuria (history of prostate cancer) and black stool (he states that he has been told that he has blood in his stool).  He has no prior history of CAD, but he does have a history of >40 pack years of smoking cigarettes.    Past Medical History  Diagnosis Date  . Chronic back pain   . Prostate cancer   . Depression   . COPD (chronic obstructive pulmonary disease)   . Shortness of breath   . Hypertension   . Stroke   . 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 Fracture of thumb, left, closed 01/2012    Past Surgical History  Procedure Laterality Date  . Cataract extraction, bilateral  02/2011    Dr. Lavona Mound.   . Craniotomy  05/31/2011   Procedure: CRANIOTOMY HEMATOMA EVACUATION SUBDURAL;  Surgeon: Tia Alert, MD;  Location: [facility];  Service: Neurosurgery;  Laterality: Right;  Right Frontal Craniotomy for Evacuation of Subdural Hematoma  . Lumbar spine surgery  1980s    L4-L5    Family History  Problem Relation Age of Onset  . Heart attack Mother   . Depression Son   . Diabetes Sister   . Hypertension Mother   . Hypertension Brother     Social History:  reports that he has been smoking Cigarettes.  He has been smoking about 2.00 packs per day. He does not have any smokeless tobacco history on file. He reports that he uses illicit drugs (Marijuana) about 6 times per week. He reports that he does not drink alcohol.  Allergies:  Allergies  Allergen Reactions  . Benzodiazepines Other (See Comments)    Repeated history of taking them inappropriately and having occurrences of withdrawal seizures.  . Tape Other (See Comments)    Peels skin    Medications:   ASA 81mg  qd Lisinopril 40 mg qd Metoprolol 50 mg bid  Results for orders placed during the hospital encounter of 01/17/13 (from the past 48 hour(s))  CBC WITH DIFFERENTIAL     Status: Abnormal   Collection Time    01/17/13  5:30 PM      Result Value Range   WBC 7.7  4.0 - 10.5 K/uL   RBC 3.33 (*)  4.22 - 5.81 MIL/uL   Hemoglobin 11.4 (*) 13.0 - 17.0 g/dL   HCT 16.1 (*) 09.6 - 04.5 %   MCV 100.0  78.0 - 100.0 fL   MCH 34.2 (*) 26.0 - 34.0 pg   MCHC 34.2  30.0 - 36.0 g/dL   RDW 40.9 (*) 81.1 - 91.4 %   Platelets 215  150 - 400 K/uL   Neutrophils Relative % 58  43 - 77 %   Neutro Abs 4.5  1.7 - 7.7 K/uL   Lymphocytes Relative 25  12 - 46 %   Lymphs Abs 1.9  0.7 - 4.0 K/uL   Monocytes Relative 15 (*) 3 - 12 %   Monocytes Absolute 1.2 (*) 0.1 - 1.0 K/uL   Eosinophils Relative 2  0 - 5 %   Eosinophils Absolute 0.2  0.0 - 0.7 K/uL   Basophils Relative 0  0 - 1 %   Basophils Absolute 0.0  0.0 - 0.1 K/uL  COMPREHENSIVE METABOLIC PANEL     Status:  Abnormal   Collection Time    01/17/13  5:30 PM      Result Value Range   Sodium 127 (*) 135 - 145 mEq/L   Potassium 4.1  3.5 - 5.1 mEq/L   Chloride 91 (*) 96 - 112 mEq/L   CO2 26  19 - 32 mEq/L   Glucose, Bld 89  70 - 99 mg/dL   BUN 34 (*) 6 - 23 mg/dL   Creatinine, Ser 7.82 (*) 0.50 - 1.35 mg/dL   Calcium 9.5  8.4 - 95.6 mg/dL   Total Protein 6.1  6.0 - 8.3 g/dL   Albumin 3.1 (*) 3.5 - 5.2 g/dL   AST 17  0 - 37 U/L   ALT 7  0 - 53 U/L   Alkaline Phosphatase 76  39 - 117 U/L   Total Bilirubin 0.4  0.3 - 1.2 mg/dL   GFR calc non Af Amer 31 (*) >90 mL/min   GFR calc Af Amer 36 (*) >90 mL/min   Comment: (NOTE)     The eGFR has been calculated using the CKD EPI equation.     This calculation has not been validated in all clinical situations.     eGFR's persistently <90 mL/min signify possible Chronic Kidney     Disease.  PROTIME-INR     Status: Abnormal   Collection Time    01/17/13  5:30 PM      Result Value Range   Prothrombin Time 15.9 (*) 11.6 - 15.2 seconds   INR 1.30  0.00 - 1.49  POCT I-STAT TROPONIN I     Status: None   Collection Time    01/17/13  5:42 PM      Result Value Range   Troponin i, poc 0.00  0.00 - 0.08 ng/mL   Comment 3            Comment: Due to the release kinetics of cTnI,     a negative result within the first hours     of the onset of symptoms does not rule out     myocardial infarction with certainty.     If myocardial infarction is still suspected,     repeat the test at appropriate intervals.  CG4 I-STAT (LACTIC ACID)     Status: None   Collection Time    01/17/13  5:43 PM      Result Value Range   Lactic Acid, Venous 1.06  0.5 - 2.2  mmol/L  OCCULT BLOOD, POC DEVICE     Status: None   Collection Time    01/17/13  5:45 PM      Result Value Range   Fecal Occult Bld NEGATIVE  NEGATIVE  URINALYSIS, ROUTINE W REFLEX MICROSCOPIC     Status: Abnormal   Collection Time    01/17/13  6:47 PM      Result Value Range   Color, Urine YELLOW  YELLOW    APPearance CLOUDY (*) CLEAR   Specific Gravity, Urine 1.009  1.005 - 1.030   pH 5.5  5.0 - 8.0   Glucose, UA NEGATIVE  NEGATIVE mg/dL   Hgb urine dipstick NEGATIVE  NEGATIVE   Bilirubin Urine NEGATIVE  NEGATIVE   Ketones, ur NEGATIVE  NEGATIVE mg/dL   Protein, ur NEGATIVE  NEGATIVE mg/dL   Urobilinogen, UA 0.2  0.0 - 1.0 mg/dL   Nitrite NEGATIVE  NEGATIVE   Leukocytes, UA NEGATIVE  NEGATIVE   Comment: MICROSCOPIC NOT DONE ON URINES WITH NEGATIVE PROTEIN, BLOOD, LEUKOCYTES, NITRITE, OR GLUCOSE <1000 mg/dL.    Dg Chest 2 View  01/17/2013   CLINICAL DATA:  Hypertension, weakness,  EXAM: CHEST  2 VIEW  COMPARISON:  01/15/2013  FINDINGS: The cardiac silhouette is within normal limits. Chronic and postsurgical changes identified within the left hemithorax. When compared to previous study with no new focal regions of consolidation are new focal infiltrates. No acute osseous abnormalities identified.  IMPRESSION: No active cardiopulmonary disease. Stable chronic and postsurgical changes.   Electronically Signed   By: Salome Holmes M.D.   On: 01/17/2013 17:50   Ct Head Wo Contrast  01/17/2013   CLINICAL DATA:  Headache.  Previous cranial surgery for blood clot  EXAM: CT HEAD WITHOUT CONTRAST  TECHNIQUE: Contiguous axial images were obtained from the base of the skull through the vertex without contrast.  COMPARISON:  01/25/12  FINDINGS: Premature cerebral and cerebellar atrophy. Right temporal encephalomalacia. Previous right frontotemporal craniotomy. Chronic microvascular ischemic change.  No acute stroke or hemorrhage. No mass lesion or hydrocephalus. No extra-axial fluid. Similar appearance to priors.  IMPRESSION: No acute intracranial findings.   Electronically Signed   By: Davonna Belling M.D.   On: 01/17/2013 18:12    Review of Systems  Constitutional: Negative for fever, chills, weight loss, malaise/fatigue and diaphoresis.  HENT: Negative for ear discharge, ear pain, hearing loss,  nosebleeds and tinnitus.   Eyes: Negative for double vision and photophobia.  Respiratory: Positive for cough, hemoptysis, shortness of breath and wheezing. Negative for sputum production and stridor.   Cardiovascular: Positive for chest pain and leg swelling. Negative for palpitations, orthopnea, claudication and PND.  Gastrointestinal: Positive for blood in stool. Negative for heartburn, nausea, vomiting, abdominal pain, diarrhea and constipation.  Genitourinary: Positive for hematuria. Negative for dysuria, urgency and flank pain.  Musculoskeletal: Negative for back pain, joint pain, myalgias and neck pain.  Skin: Positive for rash. Negative for itching.  Neurological: Positive for dizziness and headaches. Negative for tingling, tremors, sensory change, speech change, focal weakness and seizures.  Psychiatric/Behavioral: Negative for hallucinations.   Blood pressure 104/65, pulse 110, temperature 98.1 F (36.7 C), temperature source Rectal, resp. rate 15, SpO2 94.00%. Physical Exam  Constitutional: He is oriented to person, place, and time. He appears well-developed. No distress.  HENT:  Head: Normocephalic and atraumatic.  Eyes: EOM are normal. Right eye exhibits no discharge. Left eye exhibits no discharge. No scleral icterus.  Neck: Normal range of motion. Neck supple. No JVD  present. No tracheal deviation present. No thyromegaly present.  Cardiovascular: Regular rhythm, normal heart sounds and intact distal pulses.  Exam reveals no gallop and no friction rub.   No murmur heard. Respiratory: No stridor. No respiratory distress. He has no wheezes. He has no rales. He exhibits no tenderness.  GI: Soft. Bowel sounds are normal. He exhibits no distension. There is no tenderness. There is no rebound and no guarding.  Musculoskeletal: Normal range of motion. He exhibits edema. He exhibits no tenderness.  Neurological: He is alert and oriented to person, place, and time.  Skin: Rash noted. He  is not diaphoretic. No pallor.  Psychiatric: He has a normal mood and affect.    Assessment/Plan:  Chest pain with abnormal EKG.  His chest pain is mainly atypical in nature; however, he does have risk factors for CAD including HTN and long history of tobacco abuse.  His EKG is as described above and does not appear to show definite ischemia at this time. ? Pericarditis? His troponin is currently 0.  Given his complaints of hemoptysis, hematuria and bloody stool, I will recommend conservative treatment at this time with ASA 81 mg qd (when there is no active bleeding), low dose beta-blockers and Statin. Please obtain serial cardiac markers, TTE to evaluate LV and RV function.  If he does not rule in for MI, he may have a pharmacologic nuclear stress test to see if he has a large reversible defect. We will review the results of the above test so that we have more information to determine how to proceed with his care.    Willie Ward E 01/17/2013, 8:08 PM

## 2013-01-17 NOTE — H&P (Addendum)
[facility]  History and Physical  Willie Ward Willie Ward:[id number] DOB: 1940/11/21 DOA: 01/17/2013  Referring physician: EDP PCP: Nani Gasser, MD  Outpatient Specialists:  1. None  Chief Complaint: Chest pain, dyspnea and generalized weakness.  HPI: Willie Ward is a 72 y.o. male with extensive PMH- a recent hospitalization at [facility] for PE & falls, subsequent discharge to SNF and released approximately a month ago, prostate cancer, COPD not on home oxygen, hypertension, CVA, benzodiazepine withdrawal seizures, tobacco abuse, chronic back pain, anxiety, depression, and marijuana dependence and narcotic abuse, presented to the ED secondary to worsening intermittent chest pain, dyspnea and generalized weakness. Patient is a very poor historian and vague about his symptoms and his history keeps changing frequently. We states that he lives alone at home and has been mobile with a wheelchair and has not walked for almost 6 months. He sustained for falls and was admitted to [facility] at which time he was diagnosed with PE and started on Xarelto (12/07/12) and discharged to a skilled nursing facility. Since discharge from the SNF approximately a month ago, patient states that he has sustained one fall soon after but none since. He gives history of chronic dyspnea, mostly with minimal exertion ongoing for one to 2 months. He has intermittent cough with occasional minimal hemoptysis and the last time was 1.5 weeks ago. He also complains of intermittent anterior chest pain for one to 2 months. He describes the chest pain as sharp, fleeting lasting a few moments, going through his chest, no radiation or sweating. He also complains of pains in other places such as bilateral forearms. He states that he had noticed some blood tinged urine approximately a week ago but none since. He also states that he noticed a streak of black stool within normal colored stool approximately a week ago  but none since. His appetite is slightly decreased. He also had headache prior to admission which has resolved. In the ED, evaluation revealed sodium 127, creatinine 2.06, worse than usual, albumin 3.1, hemoglobin 11.4, chest x-ray without acute findings and CT head without acute findings. EKG was abnormal. As per recent followup with PCP, patient noted to be hypotensive and his lisinopril was held. Cardiology was consulted by ED p.m. hospitalist admission requested.   Review of Systems: All systems reviewed and apart from history of presenting illness, are negative.  Past Medical History  Diagnosis Date  . Chronic back pain   . Prostate cancer   . Depression   . COPD (chronic obstructive pulmonary disease)   . Shortness of breath   . Hypertension   . Stroke   . 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 Fracture of thumb, left, closed 01/2012   Past Surgical History  Procedure Laterality Date  . Cataract extraction, bilateral  02/2011    Dr. Lavona Mound.   . 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 Lumbar spine surgery  1980s    L4-L5   Social History:  reports that he has been smoking Cigarettes.  He has been smoking about 2.00 packs per day. He does not have any smokeless tobacco history on file. He reports that he uses illicit drugs (Marijuana) about 6 times per week. He reports that he does not drink alcohol. Single. Lives alone. Moves around with the  help of a wheelchair. Continues to smoke cigars.  Allergies  Allergen Reactions  . Benzodiazepines Other (See Comments)    Repeated history of taking them inappropriately and having occurrences of withdrawal seizures.  . Tape Other (See Comments)    Peels skin    Family History  Problem Relation Age of Onset  . Heart attack Mother   .  Depression Son   . Diabetes Sister   . Hypertension Mother   . Hypertension Brother     Prior to Admission medications   Medication Sig Start Date End Date Taking? Authorizing Provider  Aclidinium Bromide (TUDORZA PRESSAIR) 400 MCG/ACT AEPB Inhale 1 puff into the lungs 2 (two) times daily. 08/07/12  Yes Agapito Games, MD  albuterol (PROAIR HFA) 108 (90 BASE) MCG/ACT inhaler Inhale 2 puffs into the lungs every 6 (six) hours as needed for wheezing or shortness of breath. 12/12/12  Yes Agapito Games, MD  AMBULATORY NON FORMULARY MEDICATION Medication Name: overnight oxymetry for SOB and pulmonary emboli.  Uses Gentiva. 12/23/12  Yes Agapito Games, MD  DULoxetine (CYMBALTA) 30 MG capsule Take 1 capsule (30 mg total) by mouth daily. 12/26/12 12/26/13 Yes Agapito Games, MD  Fluticasone Furoate-Vilanterol (BREO ELLIPTA) 100-25 MCG/INH AEPB Inhale 1 Inhaler into the lungs daily. 09/29/12  Yes Agapito Games, MD  gabapentin (NEURONTIN) 800 MG tablet TAKE 1 TABLET BY MOUTH THREE TIMES DAILY 01/14/13  Yes Agapito Games, MD  levofloxacin (LEVAQUIN) 500 MG tablet Take 1 tablet (500 mg total) by mouth daily. 01/07/13 01/17/13 Yes Agapito Games, MD  lisinopril (PRINIVIL,ZESTRIL) 40 MG tablet Take 1 tablet (40 mg total) by mouth daily. 12/26/12  Yes Agapito Games, MD  metoprolol succinate (TOPROL-XL) 50 MG 24 hr tablet Take 1 tablet (50 mg total) by mouth daily. Take with or immediately following a meal. 12/26/12  Yes Agapito Games, MD  Multiple Vitamin (MULTIVITAMIN) tablet Take 1 tablet by mouth daily.   Yes Historical Provider, MD  Rivaroxaban (XARELTO) 20 MG TABS tablet Take 20 mg by mouth 1 day or 1 dose. 01/07/13  Yes Agapito Games, MD  traMADol (ULTRAM) 50 MG tablet Take 1 tablet (50 mg total) by mouth 3 (three) times daily. 01/07/13  Yes Agapito Games, MD  traZODone (DESYREL) 150 MG tablet Take 1 tablet (150 mg total) by mouth at  bedtime. 12/26/12  Yes Agapito Games, MD   Physical Exam: Filed Vitals:   01/17/13 1641 01/17/13 1735 01/17/13 1815 01/17/13 1816  BP: 88/53  104/65   Pulse: 110     Temp: 97.8 F (36.6 C) 98.1 F (36.7 C)    TempSrc: Oral Rectal    Resp: 22  15   SpO2: 95%   94%     General exam: Moderately built and nourished chronically ill looking male patient, lying comfortably supine on the gurney in no obvious distress.  Head, eyes and ENT: Nontraumatic and normocephalic. Pupils equally reacting to light and accommodation. Oral mucosa slightly dry.  Neck: Supple. No JVD, carotid bruit or thyromegaly.  Lymphatics: No lymphadenopathy.  Respiratory system: Reduced breath sounds bilaterally with bilateral occasional expiratory rhonchi but no crackles. No increased work of breathing. Able to speak in full sentences.  Cardiovascular system: S1 and S2 heard, RRR. No JVD, murmurs, gallops, clicks or pedal edema.  Gastrointestinal system: Abdomen is nondistended, soft and nontender. Normal bowel sounds heard. No organomegaly or masses appreciated.  Central nervous system: Alert and oriented. No focal neurological  deficits.  Extremities: Symmetric 5 x 5 power. Peripheral pulses symmetrically felt.   Skin: No rashes or acute findings.  Musculoskeletal system: Negative exam.  Psychiatry: Pleasant and cooperative.   Labs on Admission:  Basic Metabolic Panel:  Recent Labs Lab 01/17/13 1730  NA 127*  K 4.1  CL 91*  CO2 26  GLUCOSE 89  BUN 34*  CREATININE 2.06*  CALCIUM 9.5   Liver Function Tests:  Recent Labs Lab 01/17/13 1730  AST 17  ALT 7  ALKPHOS 76  BILITOT 0.4  PROT 6.1  ALBUMIN 3.1*   No results found for this basename: LIPASE, AMYLASE,  in the last 168 hours No results found for this basename: AMMONIA,  in the last 168 hours CBC:  Recent Labs Lab 01/17/13 1730  WBC 7.7  NEUTROABS 4.5  HGB 11.4*  HCT 33.3*  MCV 100.0  PLT 215   Cardiac  Enzymes: No results found for this basename: CKTOTAL, CKMB, CKMBINDEX, TROPONINI,  in the last 168 hours  BNP (last 3 results) No results found for this basename: PROBNP,  in the last 8760 hours CBG: No results found for this basename: GLUCAP,  in the last 168 hours  Radiological Exams on Admission: Dg Chest 2 View  01/17/2013   CLINICAL DATA:  Hypertension, weakness,  EXAM: CHEST  2 VIEW  COMPARISON:  01/15/2013  FINDINGS: The cardiac silhouette is within normal limits. Chronic and postsurgical changes identified within the left hemithorax. When compared to previous study with no new focal regions of consolidation are new focal infiltrates. No acute osseous abnormalities identified.  IMPRESSION: No active cardiopulmonary disease. Stable chronic and postsurgical changes.   Electronically Signed   By: Salome Holmes M.D.   On: 01/17/2013 17:50   Ct Head Wo Contrast  01/17/2013   CLINICAL DATA:  Headache.  Previous cranial surgery for blood clot  EXAM: CT HEAD WITHOUT CONTRAST  TECHNIQUE: Contiguous axial images were obtained from the base of the skull through the vertex without contrast.  COMPARISON:  01/25/12  FINDINGS: Premature cerebral and cerebellar atrophy. Right temporal encephalomalacia. Previous right frontotemporal craniotomy. Chronic microvascular ischemic change.  No acute stroke or hemorrhage. No mass lesion or hydrocephalus. No extra-axial fluid. Similar appearance to priors.  IMPRESSION: No acute intracranial findings.   Electronically Signed   By: Davonna Belling M.D.   On: 01/17/2013 18:12    EKG: Independently reviewed. Sinus tachycardia to 117 beats per minute, normal axis, PR depression in inferior leads,? Partial RBBB and nonspecific ST-T abnormality. QTC: 656 ms.  Assessment/Plan Principal Problem:   Chest pain Active Problems:   History of prostate cancer   Essential hypertension, benign   Moderate COPD (chronic obstructive pulmonary disease)   Tobacco abuse   Acute  pulmonary embolism   Weakness   Chronic back pain   Shortness of breath   Atypical chest pain - Unclear etiology. Abnormal EKG as above. Troponin x1 negative. - Admit to telemetry. Cycle cardiac enzymes. Check 2-D echo. - Cardiology has consulted and agree with the above management. However if patient's chest pain returns then may need further evaluation by cardiology. - Chest pain currently resolved. - Discussed with cardiology: Since patient will be continued on anticoagulation, okay to hold aspirin especially given atypical nature of chest pain, negative troponin and increased risk for bleeding.  Dyspnea/recent pulmonary embolism/COPD - DD: COPD and recent PE - Tobacco cessation counseled - Continue oxygen, home respiratory medications and bronchodilator nebulizations. - Continue Xarelto. - Check 2-D echo -  Patient gives history of hematuria, coughing blood and? Melena. History not very consistent. Patient states that last time he had these episodes was greater than one week ago. Currently urine appears clear, UA negative for blood. Monitor for any further symptoms and will have to be readdressed at that time.  Dehydration with hyponatremia - Brief IV normal saline  Acute on stage III chronic kidney disease - Likely precipitated by dehydration and hypotension - Hold blood pressure medications including ACE inhibitors temporarily. Hydrate with IV fluids and follow BMP. If not improving, consider renal ultrasound and further evaluation  Hypertension -  Came in hypotensive. Hold antihypertensives temporarily.  Generalized weakness - Probably multifactorial secondary to dehydration, acute on chronic kidney disease and dyspnea issues. - PT and OT evaluation  Chronic back pain  History of tobacco abuse, polysubstance abuse. - Check UDS.  Anemia - Follow CBCs    Code Status:  full  Family Communication:  none at bedside  Disposition Plan:  to be determined   Time spent:   70 minutes  Vaudine Dutan, MD, FACP, FHM. [facility] Pager [phone]  If 7PM-7AM, please contact night-coverage [email or web address] Password [facility] 01/17/2013, 8:38 PM

## 2013-01-18 ENCOUNTER — Ambulatory Visit (HOSPITAL_COMMUNITY)
Admit: 2013-01-18 | Discharge: 2013-01-18 | Disposition: A | Discharge status: Home / Self Care | Payer: Medicare Other | Attending: Cardiovascular Disease | Admitting: Cardiovascular Disease

## 2013-01-18 ENCOUNTER — Other Ambulatory Visit: Payer: Self-pay

## 2013-01-18 DIAGNOSIS — I2699 Other pulmonary embolism without acute cor pulmonale: Secondary | ICD-10-CM

## 2013-01-18 DIAGNOSIS — J4489 Other specified chronic obstructive pulmonary disease: Secondary | ICD-10-CM

## 2013-01-18 DIAGNOSIS — I369 Nonrheumatic tricuspid valve disorder, unspecified: Secondary | ICD-10-CM

## 2013-01-18 DIAGNOSIS — N189 Chronic kidney disease, unspecified: Secondary | ICD-10-CM

## 2013-01-18 DIAGNOSIS — E871 Hypo-osmolality and hyponatremia: Secondary | ICD-10-CM

## 2013-01-18 DIAGNOSIS — R079 Chest pain, unspecified: Secondary | ICD-10-CM

## 2013-01-18 DIAGNOSIS — J449 Chronic obstructive pulmonary disease, unspecified: Secondary | ICD-10-CM

## 2013-01-18 DIAGNOSIS — N179 Acute kidney failure, unspecified: Secondary | ICD-10-CM

## 2013-01-18 LAB — CBC
MCH: 33.6 pg (ref 26.0–34.0)
MCHC: 33.3 g/dL (ref 30.0–36.0)
MCV: 100.7 fL — ABNORMAL HIGH (ref 78.0–100.0)
Platelets: 198 10*3/uL (ref 150–400)
RBC: 2.8 MIL/uL — ABNORMAL LOW (ref 4.22–5.81)
WBC: 5 10*3/uL (ref 4.0–10.5)

## 2013-01-18 LAB — BASIC METABOLIC PANEL
BUN: 27 mg/dL — ABNORMAL HIGH (ref 6–23)
CO2: 25 mEq/L (ref 19–32)
Calcium: 8.7 mg/dL (ref 8.4–10.5)
Creatinine, Ser: 1.64 mg/dL — ABNORMAL HIGH (ref 0.50–1.35)
GFR calc non Af Amer: 40 mL/min — ABNORMAL LOW (ref >90)
Glucose, Bld: 115 mg/dL — ABNORMAL HIGH (ref 70–99)
Sodium: 132 mEq/L — ABNORMAL LOW (ref 135–145)

## 2013-01-18 MED ORDER — REGADENOSON 0.4 MG/5ML IV SOLN
INTRAVENOUS | Status: AC
  Administered 2013-01-18: 0.4 mg
  Filled 2013-01-18: qty 5

## 2013-01-18 MED ORDER — SODIUM CHLORIDE 0.9 % IV SOLN: INTRAVENOUS | Status: DC

## 2013-01-18 MED ORDER — SALINE SPRAY 0.65 % NA SOLN
1.0000 | NASAL | Status: DC | PRN
  Filled 2013-01-18: qty 44

## 2013-01-18 MED ORDER — TECHNETIUM TC 99M SESTAMIBI - CARDIOLITE
30.0000 | Freq: Once | INTRAVENOUS | Status: AC | PRN
  Administered 2013-01-18: 30 via INTRAVENOUS

## 2013-01-18 MED ORDER — INSULIN ASPART 100 UNIT/ML ~~LOC~~ SOLN: 0.0000 [IU] | Freq: Three times a day (TID) | SUBCUTANEOUS | Status: DC

## 2013-01-18 MED ORDER — REGADENOSON 0.4 MG/5ML IV SOLN: 0.4000 mg | Freq: Once | INTRAVENOUS | Status: DC

## 2013-01-18 MED ORDER — TECHNETIUM TC 99M SESTAMIBI GENERIC - CARDIOLITE
10.0000 | Freq: Once | INTRAVENOUS | Status: AC | PRN
  Administered 2013-01-18: 10 via INTRAVENOUS

## 2013-01-18 NOTE — Progress Notes (Signed)
Orthostatics this am were: Lying HR 102  Sitting HR 152  Standing HR 124         101/49    93/53  88/60  98 F (oral) Respirations 20

## 2013-01-18 NOTE — Progress Notes (Signed)
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) -Secondary to volume depletion -positive orthostatic vitals--HR increase109-->152 -Improving with IV fluids -Continue IV fluids -Discontinue lisinopril -Baseline creatinine 1.0-1.2  Dyspnea -Multifactorial including the patient's continued tobacco use, COPD and pulmonary embolus -Continue rivaroxaban -Echo as above COPD -continue BREO Ellipta -aerosolized albuterol -continue spiriva PE -continue Xarelto--started 12/07/12 -Inconsistent history of hemoptysis -Will monitor closely -Urinalysis did not show any pyuria or hematuria -Guaiac stool anemia of chronic disease, anemia of CKD  -Check serum B12, RBC folate  -Check iron stores    tobacco abuse -Tobacco cessation discussed -UDS negative Hypertension -Blood pressure is soft -Discontinue all antihypertensives -Morning cortisol generalize weakness  -Multifactorial  -TSH--0.90 -PT OT   Family Communication:   Pt at beside Disposition Plan:   Home when medically stable       Procedures/Studies: Dg Chest 2 View  01/17/2013   CLINICAL DATA:  Hypertension, weakness,  EXAM: CHEST  2 VIEW  COMPARISON:  01/15/2013  FINDINGS: The cardiac silhouette is within normal limits. Chronic and postsurgical changes identified within the left hemithorax. When compared to previous study with no new focal regions of consolidation are new focal infiltrates. No acute osseous abnormalities identified.  IMPRESSION: No active cardiopulmonary disease. Stable chronic and postsurgical changes.   Electronically Signed   By: Salome Holmes M.D.   On: 01/17/2013 17:50    Dg Chest 2 View  01/15/2013   CLINICAL DATA:  Shortness of breath.  EXAM: CHEST  2 VIEW  COMPARISON:  06/06/2011  FINDINGS: Cardiac silhouette is normal in size and configuration. Mediastinum is normal in contour. No hilar masses.  The lungs are hyperexpanded with decreased vascular markings in the upper lobes suggesting emphysema. No lung consolidation or edema. There are areas of calcified pleural plaque most evident in the left mid hemi thorax. Postsurgical changes with partial resection of the left 5th rib is also stable.  IMPRESSION: No acute cardiopulmonary disease.  No change from the prior study.   Electronically Signed   By: Amie Portland M.D.   On: 01/15/2013 16:25   Ct Head Wo Contrast  01/17/2013   CLINICAL DATA:  Headache.  Previous cranial surgery for blood clot  EXAM: CT HEAD WITHOUT CONTRAST  TECHNIQUE: Contiguous axial images were obtained from the base of the skull through the vertex without contrast.  COMPARISON:  01/25/12  FINDINGS: Premature cerebral and cerebellar atrophy. Right temporal encephalomalacia. Previous right frontotemporal craniotomy. Chronic microvascular ischemic change.  No acute stroke or hemorrhage. No mass lesion or hydrocephalus. No extra-axial fluid. Similar appearance to priors.  IMPRESSION: No acute intracranial findings.   Electronically Signed   By: Davonna Belling M.D.   On: 01/17/2013 18:12   Nm Myocar Multi W/spect W/wall Motion / Ef  01/18/2013   CLINICAL DATA:  72 year old male smoker with current history of COPD and hypertension, presenting with chest pain.  EXAM: MYOCARDIAL IMAGING WITH SPECT (REST AND PHARMACOLOGIC-STRESS)  GATED LEFT VENTRICULAR WALL MOTION STUDY  LEFT VENTRICULAR EJECTION FRACTION  TECHNIQUE: Standard myocardial SPECT imaging was performed after resting intravenous injection of 10 mCi Tc-58m sestamibi. Subsequently, intravenous infusion of Lexiscan was performed under the supervision of the Cardiology staff. At peak effect of the  drug, 30 mCi Tc-49m sestamibi was injected intravenously and  standard myocardial SPECT imaging was performed. Quantitative gated imaging was also performed to evaluate left ventricular wall motion, and estimate left ventricular ejection fraction.  COMPARISON:  03/02/2009.  FINDINGS: Immediate post regadenoson images demonstrate slight his does take in the inferior wall relative to the remainder of the myocardium. Initial resting images demonstrate similar findings. No evidence of reversibility to suggest ischemia. Findings confirm by the computer generated polar map. No significant change in the myocardial perfusion since the prior examination.  Gated images demonstrate satisfactory thickening throughout the left ventricular myocardium, including the inferior wall, with normal wall motion throughout.  Estimated QGS left ventricular ejection fraction measured 66%, with an end-diastolic volume of 88 ml and an end systolic volume of 30 ml. This is almost identical to the estimated left ventricular ejection fraction of 65% on the prior examination.  IMPRESSION: 1. No evidence of myocardial ischemia or infarction. Inferior wall thinning likely related to diaphragmatic attenuation. 2. Normal left ventricular wall motion. 3. Estimated QGS left ventricular ejection fraction 66%. 4. No significant interval change since the 02/2009 examination.   Electronically Signed   By: Hulan Saas M.D.   On: 01/18/2013 12:32         Subjective:   Objective: Filed Vitals:   01/18/13 0635 01/18/13 0636 01/18/13 0726 01/18/13 1336  BP: 93/53 88/60  135/73  Pulse: 152 124 91 86  Temp:      TempSrc:      Resp:   20 20  Height:      Weight:  68 kg (149 lb 14.6 oz)    SpO2: 90% 90% 92% 100%    Intake/Output Summary (Last 24 hours) at 01/18/13 1407 Last data filed at 01/18/13 0659  Gross per 24 hour  Intake 1478.75 ml  Output   1325 ml  Net 153.75 ml   Weight change:  Exam:   General:  Pt is alert, follows  commands appropriately, not in acute distress  HEENT: No icterus, No thrush, No neck mass, [place]/AT  Cardiovascular: RRR, S1/S2, no rubs, no gallops  Respiratory: CTA bilaterally, no wheezing, no crackles, no rhonchi  Abdomen: Soft/+BS, non tender, non distended, no guarding  Extremities: No edema, No lymphangitis, No petechiae, No rashes, no synovitis  Data Reviewed: Basic Metabolic Panel:  Recent Labs Lab 01/17/13 1730 01/18/13 0335  NA 127* 132*  K 4.1 4.1  CL 91* 100  CO2 26 25  GLUCOSE 89 115*  BUN 34* 27*  CREATININE 2.06* 1.64*  CALCIUM 9.5 8.7   Liver Function Tests:  Recent Labs Lab 01/17/13 1730  AST 17  ALT 7  ALKPHOS 76  BILITOT 0.4  PROT 6.1  ALBUMIN 3.1*   No results found for this basename: LIPASE, AMYLASE,  in the last 168 hours No results found for this basename: AMMONIA,  in the last 168 hours CBC:  Recent Labs Lab 01/17/13 1730 01/18/13 0335  WBC 7.7 5.0  NEUTROABS 4.5  --   HGB 11.4* 9.4*  HCT 33.3* 28.2*  MCV 100.0 100.7*  PLT 215 198   Cardiac Enzymes:  Recent Labs Lab 01/17/13 2112 01/18/13 0335  TROPONINI <0.30 <0.30   BNP: No components found with this basename: POCBNP,  CBG: No results found for this basename: GLUCAP,  in the last 168 hours  No results found for this or any previous visit (from the past 240 hour(s)).   Scheduled Meds: . DULoxetine  30 mg Oral Daily  . Fluticasone Furoate-Vilanterol  1 Inhaler Inhalation Daily  .  gabapentin  800 mg Oral TID  . insulin aspart  0-9 Units Subcutaneous TID WC  . multivitamin with minerals  1 tablet Oral Daily  . Rivaroxaban  20 mg Oral Daily  . sodium chloride  3 mL Intravenous Q12H  . tiotropium  18 mcg Inhalation Daily  . traMADol  50 mg Oral TID  . traZODone  150 mg Oral QHS   Continuous Infusions:    Willie Yankowski, DO  [facility] Pager [phone]  If 7PM-7AM, please contact night-coverage [email or web address] Password [email or web address] 01/18/2013, 2:07 PM   LOS: 1 day

## 2013-01-18 NOTE — Progress Notes (Signed)
Patient stated that he had left his wallet at home - a friend had located the wallet. Patient is less anxious now.

## 2013-01-18 NOTE — Evaluation (Addendum)
Physical Therapy Evaluation Patient Details Name: Willie Ward MRN: [id number] DOB: 1940/08/30 Today's Date: 01/18/2013 Time: 0255-0330 PT Time Calculation (min): 35 min  PT Assessment / Plan / Recommendation History of Present Illness  72 y.o. male with extensive PMH- a recent hospitalization at [facility] for PE & falls, subsequent discharge to SNF and released approximately a month ago, prostate cancer, COPD not on home oxygen, hypertension, CVA, benzodiazepine withdrawal seizures, tobacco abuse, chronic back pain, anxiety, depression, and marijuana dependence and narcotic abuse, presented to the ED secondary to worsening intermittent chest pain, dyspnea and generalized weakness. Patient is a very poor historian and vague about his symptoms and his history keeps changing frequently. We states that he lives alone at home and has been mobile with a wheelchair and has not walked for almost 6 months. He sustained for falls and was admitted to [facility] at which time he was diagnosed with PE and started on Xarelto (12/07/12) and discharged to a skilled nursing facility. Since discharge from the SNF approximately a month ago, patient states that he has sustained one fall soon after but none since. He gives history of chronic dyspnea, mostly with minimal exertion ongoing for one to 2 months. He has intermittent cough with occasional minimal hemoptysis and the last time was 1.5 weeks ago. He also complains of intermittent anterior chest pain for one to 2 months. He describes the chest pain as sharp, fleeting lasting a few moments, going through his chest, no radiation or sweating. He also complains of pains in other places such as bilateral forearms.   Clinical Impression  Pt with decreased balance with ambulation distance of 80 feet without ad and would benefit from resuming HHPT after d/c from acute care.  Pt was able to propel w/c 2 laps around floor for strengthening of B LE and activity  tolerance training.  WIll follow pt acutely to work towards improving gait and safety due to multiple falls at home.    PT Assessment  Patient needs continued PT services    Follow Up Recommendations  Home health PT    Does the patient have the potential to tolerate intense rehabilitation      Barriers to Discharge        Equipment Recommendations       Recommendations for Other Services     Frequency Min 3X/week    Precautions / Restrictions Precautions Precautions: Fall   Pertinent Vitals/Pain C/o back and R shoulder pain      Mobility  Bed Mobility Bed Mobility: Sit to Supine Sit to Supine: 7: Independent Transfers Transfers: Sit to Stand;Stand to Sit Sit to Stand: 5: Supervision Stand to Sit: 5: Supervision Ambulation/Gait Ambulation/Gait Assistance: 4: Min guard;4: Min assist Ambulation Distance (Feet): 80 Feet Assistive device: None Ambulation/Gait Assistance Details: 1 LOB requiring MIN A Gait Pattern: Decreased step length - left;Decreased step length - right Gait velocity: increased with decreased safety Wheelchair Mobility Wheelchair Mobility: Yes Wheelchair Assistance: 6: Modified independent (Device/Increase time) Wheelchair Propulsion: Both upper extremities;Both lower extermities Wheelchair Parts Management: Needs assistance    Exercises Other Exercises Other Exercises: Reviewed home exercise with patient   PT Diagnosis: Difficulty walking  PT Problem List: Decreased balance;Decreased mobility;Decreased activity tolerance PT Treatment Interventions: Gait training;Functional mobility training;Therapeutic activities;Therapeutic exercise;Stair training     PT Goals(Current goals can be found in the care plan section) Acute Rehab PT Goals PT Goal Formulation: With patient Time For Goal Achievement: 01/25/13 Potential to Achieve Goals: Good  Visit Information  Last PT Received On: 01/18/13 Assistance Needed: +1 History of Present Illness: 72  y.o. male with extensive PMH- a recent hospitalization at [facility] for PE & falls, subsequent discharge to SNF and released approximately a month ago, prostate cancer, COPD not on home oxygen, hypertension, CVA, benzodiazepine withdrawal seizures, tobacco abuse, chronic back pain, anxiety, depression, and marijuana dependence and narcotic abuse, presented to the ED secondary to worsening intermittent chest pain, dyspnea and generalized weakness. Patient is a very poor historian and vague about his symptoms and his history keeps changing frequently. We states that he lives alone at home and has been mobile with a wheelchair and has not walked for almost 6 months. He sustained for falls and was admitted to [facility] at which time he was diagnosed with PE and started on Xarelto (12/07/12) and discharged to a skilled nursing facility. Since discharge from the SNF approximately a month ago, patient states that he has sustained one fall soon after but none since. He gives history of chronic dyspnea, mostly with minimal exertion ongoing for one to 2 months. He has intermittent cough with occasional minimal hemoptysis and the last time was 1.5 weeks ago. He also complains of intermittent anterior chest pain for one to 2 months. He describes the chest pain as sharp, fleeting lasting a few moments, going through his chest, no radiation or sweating. He also complains of pains in other places such as bilateral forearms.        Prior Functioning  Home Living Family/patient expects to be discharged to:: Private residence Living Arrangements: Alone Available Help at Discharge: Friend(s);Available PRN/intermittently Type of Home: Mobile home Home Access: Stairs to enter Entrance Stairs-Number of Steps: 3 (friend helps) Home Layout: One level Home Equipment: Wheelchair - [employer] - 2 wheels Additional Comments: needs anti-tippers and new leg rests for w/c per patient Prior Function Level of  Independence: Independent with assistive device(s);Needs assistance Gait / Transfers Assistance Needed: A with stairs.  Amb short distances crusing furniture.  Uses w/c most of the time. Communication Communication: No difficulties    Cognition  Cognition Arousal/Alertness: Awake/alert Behavior During Therapy: WFL for tasks assessed/performed Overall Cognitive Status: Within Functional Limits for tasks assessed    Extremity/Trunk Assessment Lower Extremity Assessment Lower Extremity Assessment: Overall WFL for tasks assessed   Balance    End of Session PT - End of Session Equipment Utilized During Treatment: Gait belt Activity Tolerance: Patient tolerated treatment well Patient left: in chair;with chair alarm set Nurse Communication: Mobility status;Other (comment) (o2 sats)  GP     Teryl Mcconaghy LUBECK 01/18/2013, 3:40 PM

## 2013-01-18 NOTE — Progress Notes (Signed)
Patient ID: Willie Ward, male   DOB: [birth year], 72 y.o.   MRN: [id number]    Subjective:  Denies SSCP, palpitations or Dyspnea   Objective:  Filed Vitals:   01/18/13 0634 01/18/13 0635 01/18/13 0636 01/18/13 0726  BP: 101/49 93/53 88/60    Pulse: 102 152 124 91  Temp: 98 F (36.7 C)     TempSrc: Oral     Resp: 20   20  Height:      Weight:   149 lb 14.6 oz (68 kg)   SpO2: 90% 90% 90% 92%    Intake/Output from previous day:  Intake/Output Summary (Last 24 hours) at 01/18/13 0757 Last data filed at 01/18/13 4098  Gross per 24 hour  Intake 1478.75 ml  Output   1325 ml  Net 153.75 ml    Physical Exam: Affect appropriate Disheveled elderly white male HEENT: normal Neck supple with no adenopathy JVP normal no bruits no thyromegaly Lungs clear with no wheezing and good diaphragmatic motion Heart:  S1/S2 no murmur, no rub, gallop or click PMI normal Abdomen: benighn, BS positve, no tenderness, no AAA no bruit.  No HSM or HJR Distal pulses intact with no bruits No edema Neuro non-focal Skin warm and dry No muscular weakness   Lab Results: Basic Metabolic Panel:  Recent Labs  11/91/47 1730 01/18/13 0335  NA 127* 132*  K 4.1 4.1  CL 91* 100  CO2 26 25  GLUCOSE 89 115*  BUN 34* 27*  CREATININE 2.06* 1.64*  CALCIUM 9.5 8.7   Liver Function Tests:  Recent Labs  01/17/13 1730  AST 17  ALT 7  ALKPHOS 76  BILITOT 0.4  PROT 6.1  ALBUMIN 3.1*   No results found for this basename: LIPASE, AMYLASE,  in the last 72 hours CBC:  Recent Labs  01/17/13 1730 01/18/13 0335  WBC 7.7 5.0  NEUTROABS 4.5  --   HGB 11.4* 9.4*  HCT 33.3* 28.2*  MCV 100.0 100.7*  PLT 215 198   Cardiac Enzymes:  Recent Labs  01/17/13 2112 01/18/13 0335  TROPONINI <0.30 <0.30    Imaging: Dg Chest 2 View  01/17/2013   CLINICAL DATA:  Hypertension, weakness,  EXAM: CHEST  2 VIEW  COMPARISON:  01/15/2013  FINDINGS: The cardiac silhouette is within normal limits.  Chronic and postsurgical changes identified within the left hemithorax. When compared to previous study with no new focal regions of consolidation are new focal infiltrates. No acute osseous abnormalities identified.  IMPRESSION: No active cardiopulmonary disease. Stable chronic and postsurgical changes.   Electronically Signed   By: Salome Holmes M.D.   On: 01/17/2013 17:50   Ct Head Wo Contrast  01/17/2013   CLINICAL DATA:  Headache.  Previous cranial surgery for blood clot  EXAM: CT HEAD WITHOUT CONTRAST  TECHNIQUE: Contiguous axial images were obtained from the base of the skull through the vertex without contrast.  COMPARISON:  01/25/12  FINDINGS: Premature cerebral and cerebellar atrophy. Right temporal encephalomalacia. Previous right frontotemporal craniotomy. Chronic microvascular ischemic change.  No acute stroke or hemorrhage. No mass lesion or hydrocephalus. No extra-axial fluid. Similar appearance to priors.  IMPRESSION: No acute intracranial findings.   Electronically Signed   By: Davonna Belling M.D.   On: 01/17/2013 18:12    Cardiac Studies:  ECG: Sinus tachycardia J point elevation 2,3,F ? Artifact from arm motion   Telemetry:  NSR no arrhythmia  Echo: pending  Medications:   . DULoxetine  30 mg Oral Daily  .  Fluticasone Furoate-Vilanterol  1 Inhaler Inhalation Daily  . gabapentin  800 mg Oral TID  . multivitamin with minerals  1 tablet Oral Daily  . Rivaroxaban  20 mg Oral Daily  . sodium chloride  3 mL Intravenous Q12H  . tiotropium  18 mcg Inhalation Daily  . traMADol  50 mg Oral TID  . traZODone  150 mg Oral QHS     . sodium chloride 75 mL/hr at 01/18/13 5638    Assessment/Plan:  Chest Pain:  Very vague historian He indicates that he has been taking his xarelto.  Troponin negative will try to get myovue and echo for today PE:  Continue xarelto will have to be held for 24 hours before any invasive procedure COPD:  Continue inhaler and oxygen Anemia:  ? Previous w/u   Significant in regard to his weakness and ability to stay on anticoagulation  Charlton Haws 01/18/2013, 7:57 AM

## 2013-01-18 NOTE — Progress Notes (Signed)
Utilization Review completed.  

## 2013-01-18 NOTE — Progress Notes (Signed)
  Echocardiogram 2D Echocardiogram has been performed.  Nestor Ramp M 01/18/2013, 9:57 AM

## 2013-01-18 NOTE — Plan of Care (Signed)
Problem: Problem: Skin/Wound Progression Goal: OTHER SKIN/WOUND GOAL(S) Outcome: Completed/Met Date Met:  01/18/13 Encouraging the patient to be very careful to avoid skin tears on his thin skin

## 2013-01-19 DIAGNOSIS — R031 Nonspecific low blood-pressure reading: Secondary | ICD-10-CM

## 2013-01-19 DIAGNOSIS — F17200 Nicotine dependence, unspecified, uncomplicated: Secondary | ICD-10-CM

## 2013-01-19 LAB — BASIC METABOLIC PANEL
BUN: 20 mg/dL (ref 6–23)
CO2: 26 mEq/L (ref 19–32)
Chloride: 102 mEq/L (ref 96–112)
GFR calc Af Amer: 63 mL/min — ABNORMAL LOW (ref >90)
Glucose, Bld: 115 mg/dL — ABNORMAL HIGH (ref 70–99)
Potassium: 4.3 mEq/L (ref 3.5–5.1)
Sodium: 134 mEq/L — ABNORMAL LOW (ref 135–145)

## 2013-01-19 LAB — CBC
HCT: 27.6 % — ABNORMAL LOW (ref 39.0–52.0)
Hemoglobin: 9.2 g/dL — ABNORMAL LOW (ref 13.0–17.0)
MCH: 33.6 pg (ref 26.0–34.0)
MCV: 100.7 fL — ABNORMAL HIGH (ref 78.0–100.0)
RBC: 2.74 MIL/uL — ABNORMAL LOW (ref 4.22–5.81)
WBC: 4.2 10*3/uL (ref 4.0–10.5)

## 2013-01-19 LAB — VITAMIN B12: Vitamin B-12: 604 pg/mL (ref 211–911)

## 2013-01-19 LAB — IRON AND TIBC
Iron: 20 ug/dL — ABNORMAL LOW (ref 42–135)
Saturation Ratios: 9 % — ABNORMAL LOW (ref 20–55)
UIBC: 211 ug/dL (ref 125–400)

## 2013-01-19 LAB — GLUCOSE, CAPILLARY
Glucose-Capillary: 85 mg/dL (ref 70–99)
Glucose-Capillary: 98 mg/dL (ref 70–99)
Glucose-Capillary: 96 mg/dL (ref 70–99)

## 2013-01-19 LAB — FERRITIN: Ferritin: 10 ng/mL — ABNORMAL LOW (ref 22–322)

## 2013-01-19 LAB — FOLATE RBC: RBC Folate: 1510 ng/mL — ABNORMAL HIGH (ref >280)

## 2013-01-19 MED ORDER — SODIUM CHLORIDE 0.9 % IV SOLN
125.0000 mg | Freq: Once | INTRAVENOUS | Status: AC
  Administered 2013-01-19: 14:00:00 125 mg via INTRAVENOUS
  Filled 2013-01-19: qty 10

## 2013-01-19 MED ORDER — TRAMADOL HCL 50 MG PO TABS: 50.0000 mg | ORAL_TABLET | Freq: Three times a day (TID) | ORAL | Status: DC

## 2013-01-19 MED ORDER — MOMETASONE FURO-FORMOTEROL FUM 200-5 MCG/ACT IN AERO
2.0000 | INHALATION_SPRAY | Freq: Two times a day (BID) | RESPIRATORY_TRACT | Status: DC
  Administered 2013-01-19: 2 via RESPIRATORY_TRACT
  Filled 2013-01-19: qty 8.8

## 2013-01-19 MED ORDER — FERROUS SULFATE 325 (65 FE) MG PO TABS: 325.0000 mg | ORAL_TABLET | Freq: Two times a day (BID) | ORAL | Status: DC

## 2013-01-19 MED ORDER — METOPROLOL SUCCINATE ER 25 MG PO TB24: 25.0000 mg | ORAL_TABLET | Freq: Every day | ORAL | Status: DC

## 2013-01-19 MED ORDER — ALBUTEROL SULFATE HFA 108 (90 BASE) MCG/ACT IN AERS
2.0000 | INHALATION_SPRAY | RESPIRATORY_TRACT | Status: DC | PRN
  Filled 2013-01-19: qty 6.7

## 2013-01-19 MED ORDER — FERROUS SULFATE 325 (65 FE) MG PO TABS
325.0000 mg | ORAL_TABLET | Freq: Two times a day (BID) | ORAL | Status: DC
  Filled 2013-01-19 (×2): qty 1

## 2013-01-19 NOTE — Progress Notes (Signed)
Spoke with pt concerning Home Health. Pt selected [facility], referral given to in house rep.

## 2013-01-19 NOTE — Discharge Summary (Signed)
Physician Discharge Summary  Willie Ward ZOX:[id number] DOB: [birth year] DOA: 01/17/2013  PCP: Nani Gasser, MD  Admit date: 01/17/2013 Discharge date: 01/19/2013  Recommendations for Outpatient Follow-up:  1. Pt will need to follow up with PCP in 2 weeks post discharge 2. Please obtain BMP to evaluate electrolytes and kidney function 3. Please also check CBC to evaluate Hg and Hct levels   Discharge Diagnoses:  Principal Problem:   Chest pain Active Problems:   History of prostate cancer   Essential hypertension, benign   Moderate COPD (chronic obstructive pulmonary disease)   Tobacco abuse   Acute pulmonary embolism   Weakness   Chronic back pain   Shortness of breath   Dehydration with hyponatremia   Acute on chronic renal failure Atypical chest pain  -Troponins negative x2  -Appreciate cardiology evaluation  -Myoview negative for reversible ischemia, EF 66%  -continue xarelto  -Echocardiogram--EF 60-65%, no WMA  Dehydration/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)  -Secondary to volume depletion  -positive orthostatic vitals--HR increase109-->152  -Improving with IV fluids  -Continue IV fluids while in hospital -Discontinue lisinopril  -serum creatinine 2.06 at time of admission -Baseline creatinine 1.0-1.2  -serum creatinine 1.29 on day of d/c Dyspnea -Multifactorial including the patient's continued deconditioning, ongoing tobacco use, COPD and pulmonary embolus  -Continue rivaroxaban  -Echo as above  -pt did not have oxygen desaturation with ambulation COPD  -continue BREO Ellipta  -aerosolized albuterol  -continue spiriva while in hospital -resume Fernande Boyden at home PE  -continue Xarelto--started 12/07/12  -Inconsistent history of hemoptysis  -Will monitor closely  -Urinalysis did not show any pyuria or hematuria  -Guaiac stool--NEG  anemia of chronic disease, anemia of CKD  -Check serum B12--604, RBC folate--pending -Check iron  stores--iron sat 9%, ferritin 10 -Nulecit IV given and started ferrous sulfate 325mg  bid tobacco abuse  -Tobacco cessation discussed  -UDS negative  Hypertension  -Blood pressure is soft-->improved with IVF -Discontinue all antihypertensives in hospital -restart metoprolol succinate lower dose at home-->25mg  daily  -Morning cortisol--pending generalize weakness  -Multifactorial  -TSH--0.90  -PT-->home health PT set up   Discharge Condition: stable  Disposition: home  Diet:heart healthy Wt Readings from Last 3 Encounters:  01/19/13 67.2 kg (148 lb 2.4 oz)  01/15/13 67.132 kg (148 lb)  01/07/13 67.586 kg (149 lb)    History of present illness:  72 y.o. male with extensive PMH- a recent hospitalization at [facility] for PE & falls, subsequent discharge to SNF and released approximately a month ago, prostate cancer, COPD not on home oxygen, hypertension, CVA, benzodiazepine withdrawal seizures, tobacco abuse, chronic back pain, anxiety, depression, and marijuana dependence and narcotic abuse, presented to the ED secondary to worsening intermittent chest pain, dyspnea and generalized weakness. Patient is a very poor historian and vague about his symptoms and his history keeps changing frequently. We states that he lives alone at home and has been mobile with a wheelchair and has not walked for almost 6 months. He sustained for falls and was admitted to [facility] at which time he was diagnosed with PE and started on Xarelto (12/07/12) and discharged to a skilled nursing facility. Since discharge from the SNF approximately a month ago, patient states that he has sustained one fall soon after but none since. He gives history of chronic dyspnea, mostly with minimal exertion ongoing for one to 2 months. He has intermittent cough with occasional minimal hemoptysis and the last time was 1.5 weeks prior to admission. He also complains  of intermittent anterior chest pain for one to 2  months. He describes the chest pain as sharp, fleeting lasting a few moments, going through his chest, no radiation or sweating. He also complains of pains in other places such as bilateral forearms. He states that he had noticed some blood tinged urine approximately a week ago but none since. He also states that he noticed a streak of black stool within normal colored stool approximately a week ago but none since. His appetite is slightly decreased. He also had headache prior to admission which has resolved. In the ED, evaluation revealed sodium 127, creatinine 2.06, worse than usual, albumin 3.1, hemoglobin 11.4, chest x-ray without acute findings and CT head without acute findings. EKG was abnormal. As per recent followup with PCP, patient noted to be hypotensive and his lisinopril was held. Cardiology was consulted by ED p.m. hospitalist admission requested.    Consultants: cardiology  Discharge Exam: Filed Vitals:   01/19/13 1327  BP: 134/99  Pulse: 92  Temp: 97.5 F (36.4 C)  Resp: 18   Filed Vitals:   01/19/13 0907 01/19/13 1327 01/19/13 1347 01/19/13 1449  BP:  134/99    Pulse: 78 92    Temp:  97.5 F (36.4 C)    TempSrc:  Oral    Resp: 18 18    Height:      Weight:      SpO2: 97% 96% 96% 96%   General: A&O x 3, NAD, pleasant, cooperative Cardiovascular: RRR, no rub, no gallop, no S3 Respiratory: CTAB, no wheeze, no rhonchi Abdomen:soft, nontender, nondistended, positive bowel sounds Extremities: No edema, No lymphangitis, no petechiae  Discharge Instructions      Discharge Orders   Future Appointments Provider Department Dept Phone   02/02/2013 10:45 AM Agapito Games, MD [facility]  [phone]   Future Orders Complete By Expires   Diet - low sodium heart healthy  As directed    Increase activity slowly  As directed        Medication List    STOP taking these medications       levofloxacin 500 MG tablet  Commonly  known as:  LEVAQUIN     lisinopril 40 MG tablet  Commonly known as:  PRINIVIL,ZESTRIL     multivitamin tablet      TAKE these medications       Aclidinium Bromide 400 MCG/ACT Aepb  Commonly known as:  TUDORZA PRESSAIR  Inhale 1 puff into the lungs 2 (two) times daily.     albuterol 108 (90 BASE) MCG/ACT inhaler  Commonly known as:  PROAIR HFA  Inhale 2 puffs into the lungs every 6 (six) hours as needed for wheezing or shortness of breath.     AMBULATORY NON FORMULARY MEDICATION  Medication Name: overnight oxymetry for SOB and pulmonary emboli.  Uses Gentiva.     DULoxetine 30 MG capsule  Commonly known as:  CYMBALTA  Take 1 capsule (30 mg total) by mouth daily.     ferrous sulfate 325 (65 FE) MG tablet  Take 1 tablet (325 mg total) by mouth 2 (two) times daily with a meal.     Fluticasone Furoate-Vilanterol 100-25 MCG/INH Aepb  Commonly known as:  BREO ELLIPTA  Inhale 1 Inhaler into the lungs daily.     gabapentin 800 MG tablet  Commonly known as:  NEURONTIN  TAKE 1 TABLET BY MOUTH THREE TIMES DAILY     metoprolol succinate 25 MG 24 hr tablet  Commonly known as:  TOPROL-XL  Take 1 tablet (25 mg total) by mouth daily. Take with or immediately following a meal.     Rivaroxaban 20 MG Tabs tablet  Commonly known as:  XARELTO  Take 20 mg by mouth 1 day or 1 dose.     traMADol 50 MG tablet  Commonly known as:  ULTRAM  Take 1 tablet (50 mg total) by mouth 3 (three) times daily.     traZODone 150 MG tablet  Commonly known as:  DESYREL  Take 1 tablet (150 mg total) by mouth at bedtime.         The results of significant diagnostics from this hospitalization (including imaging, microbiology, ancillary and laboratory) are listed below for reference.    Significant Diagnostic Studies: Dg Chest 2 View  01/17/2013   CLINICAL DATA:  Hypertension, weakness,  EXAM: CHEST  2 VIEW  COMPARISON:  01/15/2013  FINDINGS: The cardiac silhouette is within normal limits. Chronic and  postsurgical changes identified within the left hemithorax. When compared to previous study with no new focal regions of consolidation are new focal infiltrates. No acute osseous abnormalities identified.  IMPRESSION: No active cardiopulmonary disease. Stable chronic and postsurgical changes.   Electronically Signed   By: Salome Holmes M.D.   On: 01/17/2013 17:50   Dg Chest 2 View  01/15/2013   CLINICAL DATA:  Shortness of breath.  EXAM: CHEST  2 VIEW  COMPARISON:  06/06/2011  FINDINGS: Cardiac silhouette is normal in size and configuration. Mediastinum is normal in contour. No hilar masses.  The lungs are hyperexpanded with decreased vascular markings in the upper lobes suggesting emphysema. No lung consolidation or edema. There are areas of calcified pleural plaque most evident in the left mid hemi thorax. Postsurgical changes with partial resection of the left 5th rib is also stable.  IMPRESSION: No acute cardiopulmonary disease.  No change from the prior study.   Electronically Signed   By: Amie Portland M.D.   On: 01/15/2013 16:25   Ct Head Wo Contrast  01/17/2013   CLINICAL DATA:  Headache.  Previous cranial surgery for blood clot  EXAM: CT HEAD WITHOUT CONTRAST  TECHNIQUE: Contiguous axial images were obtained from the base of the skull through the vertex without contrast.  COMPARISON:  01/25/12  FINDINGS: Premature cerebral and cerebellar atrophy. Right temporal encephalomalacia. Previous right frontotemporal craniotomy. Chronic microvascular ischemic change.  No acute stroke or hemorrhage. No mass lesion or hydrocephalus. No extra-axial fluid. Similar appearance to priors.  IMPRESSION: No acute intracranial findings.   Electronically Signed   By: Davonna Belling M.D.   On: 01/17/2013 18:12   Nm Myocar Multi W/spect W/wall Motion / Ef  01/18/2013   CLINICAL DATA:  72 year old male smoker with current history of COPD and hypertension, presenting with chest pain.  EXAM: MYOCARDIAL IMAGING WITH SPECT  (REST AND PHARMACOLOGIC-STRESS)  GATED LEFT VENTRICULAR WALL MOTION STUDY  LEFT VENTRICULAR EJECTION FRACTION  TECHNIQUE: Standard myocardial SPECT imaging was performed after resting intravenous injection of 10 mCi Tc-62m sestamibi. Subsequently, intravenous infusion of Lexiscan was performed under the supervision of the Cardiology staff. At peak effect of the drug, 30 mCi Tc-35m sestamibi was injected intravenously and standard myocardial SPECT imaging was performed. Quantitative gated imaging was also performed to evaluate left ventricular wall motion, and estimate left ventricular ejection fraction.  COMPARISON:  03/02/2009.  FINDINGS: Immediate post regadenoson images demonstrate slight his does take in the inferior wall relative to the remainder of the myocardium. Initial  resting images demonstrate similar findings. No evidence of reversibility to suggest ischemia. Findings confirm by the computer generated polar map. No significant change in the myocardial perfusion since the prior examination.  Gated images demonstrate satisfactory thickening throughout the left ventricular myocardium, including the inferior wall, with normal wall motion throughout.  Estimated QGS left ventricular ejection fraction measured 66%, with an end-diastolic volume of 88 ml and an end systolic volume of 30 ml. This is almost identical to the estimated left ventricular ejection fraction of 65% on the prior examination.  IMPRESSION: 1. No evidence of myocardial ischemia or infarction. Inferior wall thinning likely related to diaphragmatic attenuation. 2. Normal left ventricular wall motion. 3. Estimated QGS left ventricular ejection fraction 66%. 4. No significant interval change since the 02/2009 examination.   Electronically Signed   By: Hulan Saas M.D.   On: 01/18/2013 12:32     Microbiology: No results found for this or any previous visit (from the past 240 hour(s)).   Labs: Basic Metabolic Panel:  Recent Labs Lab  01/17/13 1730 01/18/13 0335 01/19/13 0447  NA 127* 132* 134*  K 4.1 4.1 4.3  CL 91* 100 102  CO2 26 25 26   GLUCOSE 89 115* 115*  BUN 34* 27* 20  CREATININE 2.06* 1.64* 1.29  CALCIUM 9.5 8.7 8.6  MG  --   --  1.7   Liver Function Tests:  Recent Labs Lab 01/17/13 1730  AST 17  ALT 7  ALKPHOS 76  BILITOT 0.4  PROT 6.1  ALBUMIN 3.1*   No results found for this basename: LIPASE, AMYLASE,  in the last 168 hours No results found for this basename: AMMONIA,  in the last 168 hours CBC:  Recent Labs Lab 01/17/13 1730 01/18/13 0335 01/19/13 0447  WBC 7.7 5.0 4.2  NEUTROABS 4.5  --   --   HGB 11.4* 9.4* 9.2*  HCT 33.3* 28.2* 27.6*  MCV 100.0 100.7* 100.7*  PLT 215 198 195   Cardiac Enzymes:  Recent Labs Lab 01/17/13 2112 01/18/13 0335  TROPONINI <0.30 <0.30   BNP: No components found with this basename: POCBNP,  CBG:  Recent Labs Lab 01/18/13 1415 01/18/13 1632 01/18/13 2129 01/19/13 0748 01/19/13 1149  GLUCAP 85 99 109* 98 96    Time coordinating discharge:  Greater than 30 minutes  Signed:  Jaidee Stipe, DO [facility] Pager: [phone] 01/19/2013, 3:15 PM

## 2013-01-19 NOTE — Evaluation (Signed)
Occupational Therapy Evaluation Patient Details Name: Willie Ward MRN: [id number] DOB: Jan 27, 1941 Today's Date: 01/19/2013 Time: 4098-1191 OT Time Calculation (min): 17 min  OT Assessment / Plan / Recommendation History of present illness 72 y.o. male with extensive PMH- a recent hospitalization at [facility] for PE & falls, subsequent discharge to SNF and released approximately a month ago, prostate cancer, COPD not on home oxygen, hypertension, CVA, benzodiazepine withdrawal seizures, tobacco abuse, chronic back pain, anxiety, depression, and marijuana dependence and narcotic abuse, presented to the ED secondary to worsening intermittent chest pain, dyspnea and generalized weakness. Patient is a very poor historian and vague about his symptoms and his history keeps changing frequently. We states that he lives alone at home and has been mobile with a wheelchair and has not walked for almost 6 months. He sustained for falls and was admitted to [facility] at which time he was diagnosed with PE and started on Xarelto (12/07/12) and discharged to a skilled nursing facility. Since discharge from the SNF approximately a month ago, patient states that he has sustained one fall soon after but none since. He gives history of chronic dyspnea, mostly with minimal exertion ongoing for one to 2 months. He has intermittent cough with occasional minimal hemoptysis and the last time was 1.5 weeks ago. He also complains of intermittent anterior chest pain for one to 2 months. He describes the chest pain as sharp, fleeting lasting a few moments, going through his chest, no radiation or sweating. He also complains of pains in other places such as bilateral forearms.    Clinical Impression   Pt presents to OT with decreased I with ADL activity and will benefit from skilled OT to increase I with ADL activity and return to PLOF    OT Assessment  Patient needs continued OT Services    Follow Up  Recommendations  Home health OT       Equipment Recommendations  None recommended by OT       Frequency  Min 2X/week    Precautions / Restrictions Precautions Precautions: Fall       ADL  Grooming: Set up Where Assessed - Grooming: Supported sitting Upper Body Bathing: Set up Where Assessed - Upper Body Bathing: Supported sitting Lower Body Bathing: Minimal assistance Where Assessed - Lower Body Bathing: Supported sit to stand Where Assessed - Upper Body Dressing: Supported sitting Lower Body Dressing: Minimal assistance Where Assessed - Lower Body Dressing: Supported sit to Pharmacist, hospital Method: Sit to Barista: Regular height toilet Toileting - Clothing Manipulation and Hygiene: Minimal assistance Where Assessed - Engineer, mining and Hygiene: Standing    OT Diagnosis: Generalized weakness  OT Problem List: Decreased strength OT Treatment Interventions: Self-care/ADL training;Patient/family education;DME and/or AE instruction   OT Goals(Current goals can be found in the care plan section) Acute Rehab OT Goals Patient Stated Goal: get back home OT Goal Formulation: With patient Time For Goal Achievement: 02/02/13  Visit Information  Assistance Needed: +1 History of Present Illness: 72 y.o. male with extensive PMH- a recent hospitalization at [facility] for PE & falls, subsequent discharge to SNF and released approximately a month ago, prostate cancer, COPD not on home oxygen, hypertension, CVA, benzodiazepine withdrawal seizures, tobacco abuse, chronic back pain, anxiety, depression, and marijuana dependence and narcotic abuse, presented to the ED secondary to worsening intermittent chest pain, dyspnea and generalized weakness. Patient is a very poor historian and vague about his symptoms and his history keeps changing  frequently. We states that he lives alone at home and has been mobile with a wheelchair and has not walked  for almost 6 months. He sustained for falls and was admitted to [facility] at which time he was diagnosed with PE and started on Xarelto (12/07/12) and discharged to a skilled nursing facility. Since discharge from the SNF approximately a month ago, patient states that he has sustained one fall soon after but none since. He gives history of chronic dyspnea, mostly with minimal exertion ongoing for one to 2 months. He has intermittent cough with occasional minimal hemoptysis and the last time was 1.5 weeks ago. He also complains of intermittent anterior chest pain for one to 2 months. He describes the chest pain as sharp, fleeting lasting a few moments, going through his chest, no radiation or sweating. He also complains of pains in other places such as bilateral forearms.        Prior Functioning     Home Living Family/patient expects to be discharged to:: Private residence Living Arrangements: Alone Available Help at Discharge: Friend(s);Available PRN/intermittently Type of Home: Mobile home Home Access: Stairs to enter Entrance Stairs-Number of Steps: 3 (friend helps) Home Layout: One level Home Equipment: Wheelchair - [employer] - 2 wheels Additional Comments: needs anti-tippers and new leg rests for w/c per patient Prior Function Level of Independence: Independent with assistive device(s);Needs assistance Gait / Transfers Assistance Needed: A with stairs.  Amb short distances crusing furniture.  Uses w/c most of the time. Communication Communication: No difficulties Dominant Hand: Right         Vision/Perception Vision - History Baseline Vision: Wears glasses only for reading   Cognition  Cognition Arousal/Alertness: Awake/alert Behavior During Therapy: WFL for tasks assessed/performed Overall Cognitive Status: Within Functional Limits for tasks assessed    Extremity/Trunk Assessment Upper Extremity Assessment Upper Extremity Assessment: Overall WFL for tasks  assessed     Mobility Bed Mobility Bed Mobility: Sit to Supine Sit to Supine: 7: Independent Transfers Transfers: Sit to Stand;Stand to Sit Sit to Stand: 4: Min guard;From bed;With upper extremity assist;With armrests Stand to Sit: 4: Min guard;To chair/3-in-1;With upper extremity assist           End of Session OT - End of Session Equipment Utilized During Treatment: Gait belt Activity Tolerance: Patient tolerated treatment well Patient left: in chair;Other (comment) (in wheelchair) Nurse Communication: Mobility status  GO     Alba Cory 01/19/2013, 1:17 PM

## 2013-01-20 LAB — CORTISOL-AM, BLOOD: Cortisol - AM: 4.9 ug/dL (ref 4.3–22.4)

## 2013-01-23 ENCOUNTER — Telehealth: Payer: Self-pay

## 2013-01-23 MED ORDER — AMBULATORY NON FORMULARY MEDICATION: Status: AC

## 2013-01-23 NOTE — Telephone Encounter (Signed)
Faxed to wheelchair company.

## 2013-01-27 DIAGNOSIS — I10 Essential (primary) hypertension: Secondary | ICD-10-CM

## 2013-01-27 DIAGNOSIS — F329 Major depressive disorder, single episode, unspecified: Secondary | ICD-10-CM

## 2013-01-27 DIAGNOSIS — M4800 Spinal stenosis, site unspecified: Secondary | ICD-10-CM

## 2013-01-27 DIAGNOSIS — G8929 Other chronic pain: Secondary | ICD-10-CM

## 2013-01-27 DIAGNOSIS — N390 Urinary tract infection, site not specified: Secondary | ICD-10-CM

## 2013-01-27 DIAGNOSIS — J441 Chronic obstructive pulmonary disease with (acute) exacerbation: Secondary | ICD-10-CM

## 2013-01-30 ENCOUNTER — Encounter: Payer: Self-pay | Admitting: *Deleted

## 2013-02-02 ENCOUNTER — Ambulatory Visit (INDEPENDENT_AMBULATORY_CARE_PROVIDER_SITE_OTHER): Payer: Medicare Other | Admitting: Family Medicine

## 2013-02-02 ENCOUNTER — Encounter: Payer: Self-pay | Admitting: Family Medicine

## 2013-02-02 VITALS — BP 137/83 | HR 67 | Temp 96.9°F | Wt 145.0 lb

## 2013-02-02 DIAGNOSIS — E860 Dehydration: Secondary | ICD-10-CM

## 2013-02-02 DIAGNOSIS — D509 Iron deficiency anemia, unspecified: Secondary | ICD-10-CM

## 2013-02-02 DIAGNOSIS — R0902 Hypoxemia: Secondary | ICD-10-CM

## 2013-02-02 DIAGNOSIS — I2699 Other pulmonary embolism without acute cor pulmonale: Secondary | ICD-10-CM

## 2013-02-02 DIAGNOSIS — N179 Acute kidney failure, unspecified: Secondary | ICD-10-CM

## 2013-02-02 DIAGNOSIS — J449 Chronic obstructive pulmonary disease, unspecified: Secondary | ICD-10-CM

## 2013-02-02 LAB — CBC
HCT: 36.8 % — ABNORMAL LOW (ref 39.0–52.0)
Hemoglobin: 11.9 g/dL — ABNORMAL LOW (ref 13.0–17.0)
MCHC: 32.3 g/dL (ref 30.0–36.0)
RBC: 3.76 MIL/uL — ABNORMAL LOW (ref 4.22–5.81)
RDW: 15.8 % — ABNORMAL HIGH (ref 11.5–15.5)

## 2013-02-02 MED ORDER — AMBULATORY NON FORMULARY MEDICATION: Status: AC

## 2013-02-02 NOTE — Progress Notes (Addendum)
   Subjective:    Patient ID: Willie Ward, male    DOB: [birth year], 72 y.o.   MRN: [id number]  HPI Hosp f/u - physical therapist actually contact our office early in December to let us know that his blood pressure was low. We had told him to hold his lisinopril and make sure that he was getting hydrated. Couple days later he was actually admitted to [facility] for further evaluation and treatment. He was diagnosed with dehydration and acute kidney injury as well as atypical chest pain. Fortunately his Myoview is negative, troponins were negative. Echocardiogram was fairly normal. He is feeling somewhat better. He still short of breath with activity that has not really changed. He's not having as much right-sided chest discomfort as he previously was. He was diagnosed with iron deficiency anemia while he was there. We have made a referral for him to GI. That appointment is scheduled in about 2 weeks and he wants and if he can push that appointment out. He denies seeing any gross blood in the urine or stool but says occasionally his urine looks more orange.   Review of Systems     Objective:   Physical Exam  Constitutional: He is oriented to person, place, and time. He appears well-developed and well-nourished.  HENT:  Head: Normocephalic and atraumatic.  Eyes: Conjunctivae are normal. Pupils are equal, round, and reactive to light.  Neck: Neck supple. No thyromegaly present.  Cardiovascular: Normal rate, regular rhythm and normal heart sounds.   Pulmonary/Chest: Effort normal and breath sounds normal.  Lymphadenopathy:    He has no cervical adenopathy.  Neurological: He is alert and oriented to person, place, and time.  Skin: Skin is warm and dry.  Psychiatric: He has a normal mood and affect. His behavior is normal.          Assessment & Plan:  Iron def anemia -   Has GI appt in two weeks. Repeat Hgb. I explained the importance of trying to figure out why his iron stores are low.  We need to make sure that he is not losing blood per his: Even though he is not seeing any gross blood in the stools or urine. He is not vegetarian has no other history of recent blood loss.  Acute kidney injury - will recheck creatinine and BUN today. Make sure staying well hydrated at home.  HTN - well controlled today. Blood pressure looks good. Right now he's only on a low dose of metoprolol.  COPD - controlled. No recent exacerbations that he still feels short of breath with activity. We did repeat his walk test today. He was 88% at rest on room air, 87% with activity on room air, and with placing 2 L of oxygen on he improved to 93%. He does qualify for home oxygen. Hopefully this is temporary until he recovers from his pulmonary embolism. We'll go ahead and place order for home health to come out and deliver home oxygen along with the humidifier.

## 2013-02-03 LAB — COMPLETE METABOLIC PANEL WITH GFR
AST: 13 U/L (ref 0–37)
Albumin: 3.3 g/dL — ABNORMAL LOW (ref 3.5–5.2)
Alkaline Phosphatase: 73 U/L (ref 39–117)
CO2: 30 mEq/L (ref 19–32)
GFR, Est Non African American: 68 mL/min
Glucose, Bld: 115 mg/dL — ABNORMAL HIGH (ref 70–99)
Potassium: 4.9 mEq/L (ref 3.5–5.3)
Sodium: 137 mEq/L (ref 135–145)
Total Bilirubin: 0.6 mg/dL (ref 0.3–1.2)
Total Protein: 5.8 g/dL — ABNORMAL LOW (ref 6.0–8.3)

## 2013-02-03 NOTE — Addendum Note (Signed)
Addended by: Deno Etienne on: 02/03/2013 11:26 AM   Modules accepted: Orders

## 2013-02-10 ENCOUNTER — Other Ambulatory Visit: Payer: Self-pay | Admitting: Family Medicine

## 2013-02-13 ENCOUNTER — Encounter: Payer: Self-pay | Admitting: Family Medicine

## 2013-02-16 ENCOUNTER — Other Ambulatory Visit: Payer: Self-pay | Admitting: *Deleted

## 2013-02-16 MED ORDER — RIVAROXABAN 20 MG PO TABS: 20.0000 mg | ORAL_TABLET | ORAL | Status: DC

## 2013-02-23 ENCOUNTER — Ambulatory Visit: Payer: Self-pay | Admitting: Family Medicine

## 2013-02-27 ENCOUNTER — Other Ambulatory Visit: Payer: Self-pay | Admitting: Family Medicine

## 2013-03-02 DIAGNOSIS — M4800 Spinal stenosis, site unspecified: Secondary | ICD-10-CM

## 2013-03-02 DIAGNOSIS — I10 Essential (primary) hypertension: Secondary | ICD-10-CM

## 2013-03-02 DIAGNOSIS — F329 Major depressive disorder, single episode, unspecified: Secondary | ICD-10-CM

## 2013-03-02 DIAGNOSIS — I2699 Other pulmonary embolism without acute cor pulmonale: Secondary | ICD-10-CM

## 2013-03-02 DIAGNOSIS — J441 Chronic obstructive pulmonary disease with (acute) exacerbation: Secondary | ICD-10-CM

## 2013-03-02 DIAGNOSIS — G8929 Other chronic pain: Secondary | ICD-10-CM

## 2013-03-09 ENCOUNTER — Encounter: Payer: Self-pay | Admitting: Family Medicine

## 2013-03-09 ENCOUNTER — Ambulatory Visit (INDEPENDENT_AMBULATORY_CARE_PROVIDER_SITE_OTHER): Payer: Medicare Other | Admitting: Family Medicine

## 2013-03-09 ENCOUNTER — Other Ambulatory Visit: Payer: Self-pay | Admitting: Family Medicine

## 2013-03-09 VITALS — BP 145/90 | HR 80 | Temp 97.9°F | Wt 145.0 lb

## 2013-03-09 DIAGNOSIS — D509 Iron deficiency anemia, unspecified: Secondary | ICD-10-CM

## 2013-03-09 DIAGNOSIS — J449 Chronic obstructive pulmonary disease, unspecified: Secondary | ICD-10-CM

## 2013-03-09 DIAGNOSIS — M549 Dorsalgia, unspecified: Secondary | ICD-10-CM

## 2013-03-09 DIAGNOSIS — F1020 Alcohol dependence, uncomplicated: Secondary | ICD-10-CM

## 2013-03-09 DIAGNOSIS — G8929 Other chronic pain: Secondary | ICD-10-CM

## 2013-03-09 DIAGNOSIS — F17200 Nicotine dependence, unspecified, uncomplicated: Secondary | ICD-10-CM

## 2013-03-09 DIAGNOSIS — R7301 Impaired fasting glucose: Secondary | ICD-10-CM

## 2013-03-09 DIAGNOSIS — Z720 Tobacco use: Secondary | ICD-10-CM

## 2013-03-09 DIAGNOSIS — M5090 Cervical disc disorder, unspecified, unspecified cervical region: Secondary | ICD-10-CM

## 2013-03-09 LAB — POCT GLYCOSYLATED HEMOGLOBIN (HGB A1C): Hemoglobin A1C: 5.1

## 2013-03-09 MED ORDER — DULOXETINE HCL 60 MG PO CPEP: 60.0000 mg | ORAL_CAPSULE | Freq: Every day | ORAL | Status: DC

## 2013-03-09 MED ORDER — TRAMADOL HCL 50 MG PO TABS: 50.0000 mg | ORAL_TABLET | Freq: Two times a day (BID) | ORAL | Status: DC | PRN

## 2013-03-09 NOTE — Progress Notes (Signed)
   Subjective:    Patient ID: Willie Ward, male    DOB: Oct 08, 1940, 73 y.o.   MRN: [id number]  HPI COPD/ recent PE-last time I saw him we sent him home with oxygen through home health. He is on [employer].  cough his better overall.  No fever, chills or sweats.  He is still smoking about 1 pack per day.  Iron deficiency anemia-last month saw him he was supposed to follow up with GI 2 weeks later. Says appt with GI on 2/13.    Anxiety-he feels like his anxiety levels have been elevated. He also has had more pain and wants to discuss that as well. He would like to get back on the Valium and tramadol if possible.  Chronic neck and back pain-he feels like the tramadol is not really controlling his pain. He would like to discuss other options for pain control.  Review of Systems     Objective:   Physical Exam  Constitutional: He is oriented to person, place, and time. He appears well-developed and well-nourished.  HENT:  Head: Normocephalic and atraumatic.  Cardiovascular: Normal rate, regular rhythm and normal heart sounds.   Pulmonary/Chest: Effort normal and breath sounds normal.  Neurological: He is alert and oriented to person, place, and time.  Skin: Skin is warm and dry.  Psychiatric: He has a normal mood and affect. His behavior is normal.          Assessment & Plan:  COPD/recent pulmonary embolism-He has been wearing his oxygen at home.  He is still smoking  HH is no longer coming out. Based on what to say he still needs 24 hour oxygen. We did do a walk test here in the office today and he did drop down to 84% without oxygen with ambulation and came back up to 90-93% with oxygen, but still requiring 24-hour oxygen. I was hoping that by now he would be able to come off. So I would like to evaluate him further for that degree of COPD.   Followup in one month for spirometry.  Iron deficiency anemia-He has been taking iron pills.  Still feels weak.  Seeing Gi in 2 weeks.     Anxiety - we discussed different options. He is not a candidate for benzodiazepines. 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 And I reminded him that we will not prescribe this in this office for him. We can certainly work on increasing the Cymbalta to 60 mg and see if that may help with some of his chronic back pain in addition to helping with his mood. We'll increase to 60 and see him back in one month to see how is doing with it. Has follow up with GI  Abnormal glucose-A1c today well within the normal range. Very reassuring.  Chronic neck and back pain-am not ready to switch his pain regimen to a stronger narcotic at this point in time because of his history of alcohol abuse and he still drinks. I will refill his tramadol for now but continue to use sparingly. We'll also increase Cymbalta to 60 mg and see if this helps as well.

## 2013-03-09 NOTE — Progress Notes (Signed)
Pt O2 ambulating 84% w/o O2 Pt O2 non-ambulating 92% w/2L O2.Willie Ward

## 2013-03-10 ENCOUNTER — Other Ambulatory Visit: Payer: Self-pay | Admitting: Family Medicine

## 2013-03-11 ENCOUNTER — Other Ambulatory Visit (HOSPITAL_COMMUNITY): Payer: Self-pay | Admitting: Psychiatry

## 2013-03-12 ENCOUNTER — Telehealth: Payer: Self-pay | Admitting: *Deleted

## 2013-03-12 DIAGNOSIS — F1020 Alcohol dependence, uncomplicated: Secondary | ICD-10-CM

## 2013-03-12 MED ORDER — DULOXETINE HCL 60 MG PO CPEP: 60.0000 mg | ORAL_CAPSULE | Freq: Every day | ORAL | Status: DC

## 2013-03-12 NOTE — Telephone Encounter (Signed)
Willie Ward, Will you please let patient know that I'm stumped, it looks like Dr. Jerilynn Mages certainly sent in 74 of the 60mg  capsules on the 2nd.  Regardless, I've sent in a new Rx for this to [facility] on [address].

## 2013-03-12 NOTE — Telephone Encounter (Signed)
Pt calls and states that when last seen at [facility] Dr. Madilyn Fireman was increasing his Cymbalta but Cymbalta 60mg  # 30 and Cymbalta 30mg  #15 was sent to pharmacy.  He states he has been on the 60mg  for a long time anad thought she was increasing the dose.  Explained to him that Dr. Madilyn Fireman had a family emergency and that I would send to another doctor and that I would call him back in the morning with an answer. Clemetine Marker, LPN

## 2013-03-13 ENCOUNTER — Telehealth (HOSPITAL_COMMUNITY): Payer: Self-pay | Admitting: Psychiatry

## 2013-03-13 MED ORDER — DULOXETINE HCL 30 MG PO CPEP: 30.0000 mg | ORAL_CAPSULE | Freq: Every day | ORAL | Status: DC

## 2013-03-13 NOTE — Telephone Encounter (Signed)
Erroneous encounter

## 2013-03-13 NOTE — Telephone Encounter (Signed)
The patient calls stating he was already on the Cymbalta 60mg  and she said she was gonna increase the dose.  She stated in her note she was gonna increase the med but sent in the 60mg  that he has been on as well as #15 of Cymbalta 30mg .  He stated that he has been on the 60mg  dose for years

## 2013-03-13 NOTE — Telephone Encounter (Signed)
Go ahead and total 90mg  of cymbalta taking 60 and 30 together. Follow up with metheney in 1 month. Ok to send 15 more tabs to pharmacy.

## 2013-03-13 NOTE — Telephone Encounter (Signed)
LMOM of PA instructions to increase to 90mg  Cymbalta total daily.

## 2013-03-13 NOTE — Telephone Encounter (Signed)
Ok to send cymbalta 60mg  once daily number 30 with one refill.

## 2013-03-13 NOTE — Telephone Encounter (Signed)
Willie Ward can you please look at Dr. Lacie Scotts last [place] note for this patient.  I dont want to call this patient without being able to tell him what to increase the meds to.  Metheney said she was gonna increase the med Cymbalta but didn't say to what. Please advise

## 2013-03-13 NOTE — Telephone Encounter (Signed)
Refill request. Patient has not been seen at this clinic since September and for some reason not rescheduled. He was on Cymbalta 60 mg. Agree with not starting this patient on benzodiazepines. Cymbalta was refilled by PCP.

## 2013-03-20 ENCOUNTER — Other Ambulatory Visit: Payer: Self-pay | Admitting: Family Medicine

## 2013-03-30 ENCOUNTER — Encounter: Payer: Self-pay | Admitting: Family Medicine

## 2013-03-30 ENCOUNTER — Ambulatory Visit (INDEPENDENT_AMBULATORY_CARE_PROVIDER_SITE_OTHER): Payer: Medicare Other | Admitting: Family Medicine

## 2013-03-30 VITALS — BP 134/74 | HR 66 | Temp 97.5°F | Wt 137.0 lb

## 2013-03-30 DIAGNOSIS — G8929 Other chronic pain: Secondary | ICD-10-CM

## 2013-03-30 DIAGNOSIS — J449 Chronic obstructive pulmonary disease, unspecified: Secondary | ICD-10-CM

## 2013-03-30 DIAGNOSIS — M549 Dorsalgia, unspecified: Secondary | ICD-10-CM

## 2013-03-30 DIAGNOSIS — F1020 Alcohol dependence, uncomplicated: Secondary | ICD-10-CM

## 2013-03-30 MED ORDER — DULOXETINE HCL 60 MG PO CPEP: 60.0000 mg | ORAL_CAPSULE | Freq: Every day | ORAL | Status: DC

## 2013-03-30 MED ORDER — DULOXETINE HCL 30 MG PO CPEP: 30.0000 mg | ORAL_CAPSULE | Freq: Every day | ORAL | Status: DC

## 2013-03-30 NOTE — Patient Instructions (Signed)
Can use 2 extra strength Tylenol twice a day for additional pain relief.

## 2013-03-30 NOTE — Progress Notes (Signed)
   Subjective:    Patient ID: Willie Ward, male    DOB: 1940/02/09, 73 y.o.   MRN: [id number]  HPI Followup COPD-he's been doing well since I saw him about a month ago. He has not had any exacerbations. He still has a mild cough occasionally productive sputum. No fevers chills or sweats. He is here for spirometry today. He did not wear his oxygen into the office today but typically has on. He says he gets very short of breath with activity.   Chronic back pain-he has a history of narcotic abuse and has been very careful about giving her medication. He has been taking the tramadol 3 times a day and sometimes 4 times a day. He is completely out of the medication but is not due for refill for one more week. He wants and if we can adjust the dose. Last time I saw him we did increase the Cymbalta from 60 mg to 90 mg.  Review of Systems     Objective:   Physical Exam  Constitutional: He is oriented to person, place, and time. He appears well-developed and well-nourished.  HENT:  Head: Normocephalic and atraumatic.  Cardiovascular: Normal rate, regular rhythm and normal heart sounds.   Pulmonary/Chest: Effort normal. No respiratory distress. He has no wheezes.  Decreased breath sounds bilaterally. Improved with cough.  Neurological: He is alert and oriented to person, place, and time.  Skin: Skin is warm and dry.  Psychiatric: He has a normal mood and affect. His behavior is normal.          Assessment & Plan:  COPD- here for spirometry today.  Spirometry on it or 23rd 2015 showed FVC of 81%, FEV1 of 49% and ratio of 48%. Consistent with severe COPD. A little better than last time, but he was sick last time. Continue with home oxygen therapy. Followup in one to 2 months. Continue home oxygen therapy.  Chronic back pain-I do not want to go up on trazodone dose because of his history of seizures and head trauma. This puts him at significantly increased risk of seizure. Continue the higher  dose of Cymbalta 90 mg. Refills sent to the pharmacy today. Already on gabapentin as well. It certainly refer to pain management if desired.

## 2013-04-01 ENCOUNTER — Telehealth: Payer: Self-pay | Admitting: *Deleted

## 2013-04-01 ENCOUNTER — Encounter: Payer: Self-pay | Admitting: *Deleted

## 2013-04-01 NOTE — Telephone Encounter (Signed)
Pt called asking about his colonoscopy results. I informed him that according to the report he will receive a letter with the results.Willie Ward

## 2013-04-06 ENCOUNTER — Other Ambulatory Visit: Payer: Self-pay | Admitting: Family Medicine

## 2013-04-21 ENCOUNTER — Other Ambulatory Visit: Payer: Self-pay | Admitting: Family Medicine

## 2013-04-28 ENCOUNTER — Ambulatory Visit: Payer: Self-pay | Admitting: Family Medicine

## 2013-04-29 ENCOUNTER — Encounter: Payer: Self-pay | Admitting: Family Medicine

## 2013-04-29 ENCOUNTER — Ambulatory Visit (INDEPENDENT_AMBULATORY_CARE_PROVIDER_SITE_OTHER): Payer: Medicare Other | Admitting: Family Medicine

## 2013-04-29 VITALS — BP 124/72 | HR 69 | Wt 137.0 lb

## 2013-04-29 DIAGNOSIS — J449 Chronic obstructive pulmonary disease, unspecified: Secondary | ICD-10-CM

## 2013-04-29 DIAGNOSIS — G8929 Other chronic pain: Secondary | ICD-10-CM

## 2013-04-29 DIAGNOSIS — M545 Low back pain, unspecified: Secondary | ICD-10-CM

## 2013-04-29 DIAGNOSIS — D509 Iron deficiency anemia, unspecified: Secondary | ICD-10-CM

## 2013-04-29 DIAGNOSIS — F411 Generalized anxiety disorder: Secondary | ICD-10-CM

## 2013-04-29 DIAGNOSIS — F1020 Alcohol dependence, uncomplicated: Secondary | ICD-10-CM

## 2013-04-29 MED ORDER — TRAMADOL HCL 50 MG PO TABS: ORAL_TABLET | ORAL | Status: DC

## 2013-04-29 MED ORDER — DULOXETINE HCL 60 MG PO CPEP: 60.0000 mg | ORAL_CAPSULE | Freq: Every day | ORAL | Status: DC

## 2013-04-29 MED ORDER — DULOXETINE HCL 30 MG PO CPEP: 30.0000 mg | ORAL_CAPSULE | Freq: Every day | ORAL | Status: DC

## 2013-04-29 NOTE — Progress Notes (Signed)
Subjective:    Patient ID: Willie Ward, male    DOB: 10-15-1940, 72 y.o.   MRN: [id number]  HPI COPD - Still using his oxygen most of the tim.e. Has had a little more trouble breathing with colon. He notices it more when he leaves his windows open.  Chronic back pain - Went up the cymbalta to 60 mg about 2 months ago. He does feel it is helpful but would like to try increasing his dose again. He has noticed some improvement in his mood as well. He says he actually feels motivated to get out of his house..    Iron def anemia - Has been taking OTC iron for 3 months now.   No blood in the urine or stool.    Alchol depedence.  Says stopped drinking about 2 weeks ago.   GAD- says he has felt better and he feels like he wants to get out of the house.  Review of Systems  BP 124/72  Pulse 69  Wt 137 lb (62.143 kg)  SpO2 94%    Allergies  Allergen Reactions  . Benzodiazepines Other (See Comments)    Repeated history of taking them inappropriately and having occurrences of withdrawal seizures.  . Liver     Other reaction(s): Other vomiting  . Tape Other (See Comments)    Peels skin    Past Medical History  Diagnosis Date  . Chronic back pain   . Prostate cancer   . Depression   . COPD (chronic obstructive pulmonary disease)   . Shortness of breath   . Hypertension   . Stroke   . 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 Fracture of thumb, left, closed 01/2012    Past Surgical History  Procedure Laterality Date  . Cataract extraction, bilateral  02/2011    Dr. Schuyler Amor.   . 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 Lumbar spine surgery  1980s    L4-L5    History   Social History  . Marital Status: Single    Spouse Name: N/A    Number of Children: N/A  . Years of  Education: GED   Occupational History  . Disabled    Social History Main Topics  . Smoking status: Current Every Day Smoker -- 2.00 packs/day    Types: Cigarettes    Last Attempt to Quit: 08/20/2010  . Smokeless tobacco: Not on file  . Alcohol Use: No     Comment: Stopped  . Drug Use: 6.00 per week    Special: Marijuana     Comment: Almost  . Sexual Activity: No   Other Topics Concern  . Not on file   Social History Narrative   1 caffeine drink per day. No exercise. Former alcoholic    Family History  Problem Relation Age of Onset  . Heart attack Mother   . Depression Son   . Diabetes Sister   . Hypertension Mother   . Hypertension Brother     Outpatient Encounter Prescriptions as of 04/29/2013  Medication Sig  . Aclidinium Bromide (TUDORZA PRESSAIR) 400 MCG/ACT AEPB Inhale 1 puff into the lungs 2 (two) times daily.  Marland Kitchen albuterol (PROAIR HFA) 108 (90 BASE) MCG/ACT inhaler Inhale 2 puffs into the lungs every 6 (six) hours as needed for  wheezing or shortness of breath.  . AMBULATORY NON FORMULARY MEDICATION Medication Name: overnight oxymetry for SOB and pulmonary emboli.  Uses Gentiva.  . AMBULATORY NON FORMULARY MEDICATION Anti tip bars for wheel chair and wheelchair pillow/pad  Diagnosis:Falls E888.9, COPD 491.21 and Chronic back pain 724.5 - 338.29  Fax to [phone]  . AMBULATORY NON FORMULARY MEDICATION Medication Name: Home oxygen set at 2 L. With humidifier. Diagnosis of COPD, pulmonary embolism, hypoxemia.  . DULoxetine (CYMBALTA) 30 MG capsule Take 1 capsule (30 mg total) by mouth daily.  . DULoxetine (CYMBALTA) 60 MG capsule Take 1 capsule (60 mg total) by mouth daily.  . ferrous sulfate 325 (65 FE) MG tablet Take 1 tablet (325 mg total) by mouth 2 (two) times daily with a meal.  . Fluticasone Furoate-Vilanterol (BREO ELLIPTA) 100-25 MCG/INH AEPB Inhale 1 Inhaler into the lungs daily.  Marland Kitchen gabapentin (NEURONTIN) 800 MG tablet TAKE 1 TABLET BY MOUTH THREE TIMES DAILY   . metoprolol succinate (TOPROL-XL) 25 MG 24 hr tablet Take 1 tablet (25 mg total) by mouth daily. Take with or immediately following a meal.  . traMADol (ULTRAM) 50 MG tablet TAKE 1 TABLET BY MOUTH THREE TIMES DAILY. Ok to fill on 05/06/2013  . traZODone (DESYREL) 150 MG tablet TAKE 1 TABLET BY MOUTH AT BEDTIME  . [DISCONTINUED] DULoxetine (CYMBALTA) 30 MG capsule Take 1 capsule (30 mg total) by mouth daily.  . [DISCONTINUED] DULoxetine (CYMBALTA) 60 MG capsule Take 1 capsule (60 mg total) by mouth daily.  . [DISCONTINUED] Rivaroxaban (XARELTO) 20 MG TABS tablet Take 1 tablet (20 mg total) by mouth 1 day or 1 dose.  . [DISCONTINUED] traMADol (ULTRAM) 50 MG tablet TAKE 1 TABLET BY MOUTH THREE TIMES DAILY          Objective:   Physical Exam  Constitutional: He is oriented to person, place, and time. He appears well-developed and well-nourished.  HENT:  Head: Normocephalic and atraumatic.  Cardiovascular: Normal rate, regular rhythm and normal heart sounds.   Pulmonary/Chest: Effort normal and breath sounds normal.  Neurological: He is alert and oriented to person, place, and time.  Skin: Skin is warm and dry.  Psychiatric: He has a normal mood and affect. His behavior is normal.          Assessment & Plan:  Chronic Back Pain - Cymbalta wil inc to 90 mg. F/U in 1 months.  Refilled his tramadol today. Put a note on the prescription saying it could not be filled until April 1.  COPD - Stable.  Continue current regimen. He seems to be stable and lung exam is good today. He did not wear his oxygen and to the office today.  Iron def anemia - Due to recheck levels.  Lab slip provided. He says he does not have time to go today but will come in other day.  Alcohol dependence - has been sober for 2 weeks.  Continue to work on this.

## 2013-05-12 ENCOUNTER — Telehealth: Payer: Self-pay | Admitting: *Deleted

## 2013-05-12 NOTE — Telephone Encounter (Signed)
Pt calls and LM on triage line for someone to return his call.  Returned pt call at 3:49pm and LM on machine to return my call. Clemetine Marker, LPN

## 2013-05-14 NOTE — Telephone Encounter (Signed)
05/14/13 @ 10:58am- pt never returned call so am filing away in chart. Clemetine Marker, LPN

## 2013-06-05 ENCOUNTER — Encounter: Payer: Self-pay | Admitting: Family Medicine

## 2013-06-05 ENCOUNTER — Ambulatory Visit (INDEPENDENT_AMBULATORY_CARE_PROVIDER_SITE_OTHER): Payer: Medicare Other | Admitting: Family Medicine

## 2013-06-05 VITALS — BP 156/94 | HR 123 | Wt 132.0 lb

## 2013-06-05 DIAGNOSIS — R0781 Pleurodynia: Secondary | ICD-10-CM

## 2013-06-05 DIAGNOSIS — Z205 Contact with and (suspected) exposure to viral hepatitis: Secondary | ICD-10-CM

## 2013-06-05 DIAGNOSIS — M545 Low back pain, unspecified: Secondary | ICD-10-CM

## 2013-06-05 DIAGNOSIS — F1020 Alcohol dependence, uncomplicated: Secondary | ICD-10-CM

## 2013-06-05 DIAGNOSIS — G8929 Other chronic pain: Secondary | ICD-10-CM

## 2013-06-05 DIAGNOSIS — I10 Essential (primary) hypertension: Secondary | ICD-10-CM

## 2013-06-05 DIAGNOSIS — R079 Chest pain, unspecified: Secondary | ICD-10-CM

## 2013-06-05 DIAGNOSIS — D509 Iron deficiency anemia, unspecified: Secondary | ICD-10-CM

## 2013-06-05 MED ORDER — TRAMADOL HCL 50 MG PO TABS: ORAL_TABLET | ORAL | Status: DC

## 2013-06-05 MED ORDER — METOPROLOL SUCCINATE ER 25 MG PO TB24: 25.0000 mg | ORAL_TABLET | Freq: Every day | ORAL | Status: DC

## 2013-06-05 MED ORDER — ACLIDINIUM BROMIDE 400 MCG/ACT IN AEPB: 1.0000 | INHALATION_SPRAY | Freq: Two times a day (BID) | RESPIRATORY_TRACT | Status: DC

## 2013-06-05 MED ORDER — TRAZODONE HCL 150 MG PO TABS: ORAL_TABLET | ORAL | Status: DC

## 2013-06-05 MED ORDER — ALBUTEROL SULFATE HFA 108 (90 BASE) MCG/ACT IN AERS: 2.0000 | INHALATION_SPRAY | Freq: Four times a day (QID) | RESPIRATORY_TRACT | Status: DC | PRN

## 2013-06-05 MED ORDER — DULOXETINE HCL 60 MG PO CPEP: 60.0000 mg | ORAL_CAPSULE | Freq: Every day | ORAL | Status: DC

## 2013-06-05 MED ORDER — GABAPENTIN 800 MG PO TABS: ORAL_TABLET | ORAL | Status: DC

## 2013-06-05 MED ORDER — FLUTICASONE FUROATE-VILANTEROL 100-25 MCG/INH IN AEPB: 1.0000 | INHALATION_SPRAY | Freq: Every day | RESPIRATORY_TRACT | Status: DC

## 2013-06-05 MED ORDER — DULOXETINE HCL 30 MG PO CPEP: 30.0000 mg | ORAL_CAPSULE | Freq: Every day | ORAL | Status: DC

## 2013-06-05 NOTE — Progress Notes (Addendum)
   Subjective:    Patient ID: Willie Ward, male    DOB: [birth year], 73 y.o.   MRN: [id number]  HPI Alcohol abuse-Here for followup today. He has not drink any alcohol in one month. And he quit smoking 2 weeks ago he says overall he's been trying to be more healthy. He's actually been trying to walk more and get more exercise.  Chronic low back pain-we increased the Cymbalta to 90 mg 6 weeks ago. He had complained of increasing pain in his low back. He currently takes tramadol 3 times a day.Says the tramadol isn't really helping.    Iron deficiency anemia-he has been taking his iron. His sister to the lab and have this repeated but has not gone yet.  HTN - out of BP meds for a week.  No cp or SOB or palpitations.   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 He feels like for the last year or 2 it is been shifting outward and has been causing a lot of pain. The he says it's been painful ever since the surgery but feels like it's getting worse. Dr. Jearld Fenton did his original surgery.    Review of Systems     Objective:   Physical Exam  Constitutional: He is oriented to person, place, and time. He appears well-developed and well-nourished.  HENT:  Head: Normocephalic and atraumatic.  Cardiovascular: Normal rate, regular rhythm and normal heart sounds.   Pulmonary/Chest: Effort normal and breath sounds normal.  Musculoskeletal:  Has significant protrusion at the left lower rib area. He says it's tender to touch.  Neurological: He is alert and oriented to person, place, and time.  Skin: Skin is warm and dry.  Psychiatric: He has a normal mood and affect. His behavior is normal.         Assessment & Plan:  Chronic low back pain-he says the tramadol is no longer working for him and he would like to have something more powerful. I discussed that because of his history I recommend he see a pain management clinic before starting any  controlled substance pain medications. He declined at this time.  Iron deficiency anemia-repeat lab slip and encouraged him to go today. He says he will try to go next week.  Alcohol abuse-he has been abstinent for one month.  Tobacco abuse-he has been abstinent for one month.  HTN - elevated today but off meds x 1 week. Restart med. RF sent. F/U in 1 months.   Left lower rib pain-encouraged him to follow back up with the surgeons office to have this reevaluated. Dr. Jearld Fenton I believe has retired. I do think he should have the area checked to make sure that something is not shifting and causing a problem. He has had persistent pain in that area since the surgery which I suspect is probably more nerve related.  He would also like to be tested for hepatitis C. He recently found out that different history lipids and for a while was positive for hepatitis C. He says that his friend fell and hit his head. And he helped clean up the blood. He is now worried since he came directly in contact with blood that he may have hepatitis C. He's not had any symptoms. Recommend test for hepatitis, HIV and syphilis.

## 2013-06-12 ENCOUNTER — Other Ambulatory Visit: Payer: Self-pay | Admitting: Family Medicine

## 2013-06-13 LAB — IRON AND TIBC
%SAT: 9 % — ABNORMAL LOW (ref 20–55)
Iron: 30 ug/dL — ABNORMAL LOW (ref 42–165)
TIBC: 318 ug/dL (ref 215–435)
UIBC: 288 ug/dL (ref 125–400)

## 2013-06-13 LAB — HEPATITIS PANEL, ACUTE
HCV AB: NEGATIVE
HEP A IGM: NONREACTIVE
HEP B S AG: NEGATIVE
Hep B C IgM: NONREACTIVE

## 2013-06-13 LAB — HIV ANTIBODY (ROUTINE TESTING W REFLEX): HIV 1&2 Ab, 4th Generation: NONREACTIVE

## 2013-06-13 LAB — RPR

## 2013-06-13 LAB — FERRITIN: Ferritin: 16 ng/mL — ABNORMAL LOW (ref 22–322)

## 2013-06-24 LAB — BASIC METABOLIC PANEL
Creatinine: 1.3 mg/dL (ref 0.6–1.3)
Glucose: 89 mg/dL
Potassium: 4.4 mmol/L (ref 3.4–5.3)
Sodium: 140 mmol/L (ref 137–147)

## 2013-06-24 LAB — PSA: PSA: 0.01

## 2013-06-24 LAB — CBC AND DIFFERENTIAL
HEMOGLOBIN: 14.4 g/dL (ref 13.5–17.5)
PLATELETS: 223 10*3/uL (ref 150–399)
WBC: 5.3 10^3/mL

## 2013-06-24 LAB — HEPATIC FUNCTION PANEL
ALT: 14 U/L (ref 10–40)
AST: 18 U/L (ref 14–40)

## 2013-06-24 LAB — IRON: IRON: 39

## 2013-06-24 LAB — VITAMIN B12: VITAMIN B 12: 975

## 2013-06-24 LAB — HEMOGLOBIN A1C: Hgb A1c MFr Bld: 5.4 % (ref 4.0–6.0)

## 2013-07-04 ENCOUNTER — Encounter: Payer: Self-pay | Admitting: Internal Medicine

## 2013-07-07 ENCOUNTER — Ambulatory Visit (INDEPENDENT_AMBULATORY_CARE_PROVIDER_SITE_OTHER): Payer: Medicare Other | Admitting: Family Medicine

## 2013-07-07 ENCOUNTER — Other Ambulatory Visit: Payer: Self-pay | Admitting: Family Medicine

## 2013-07-07 ENCOUNTER — Encounter: Payer: Self-pay | Admitting: Family Medicine

## 2013-07-07 VITALS — BP 160/93 | HR 94 | Wt 130.0 lb

## 2013-07-07 DIAGNOSIS — E559 Vitamin D deficiency, unspecified: Secondary | ICD-10-CM

## 2013-07-07 DIAGNOSIS — M545 Low back pain, unspecified: Secondary | ICD-10-CM

## 2013-07-07 DIAGNOSIS — R1310 Dysphagia, unspecified: Secondary | ICD-10-CM

## 2013-07-07 DIAGNOSIS — D509 Iron deficiency anemia, unspecified: Secondary | ICD-10-CM

## 2013-07-07 DIAGNOSIS — I7090 Unspecified atherosclerosis: Secondary | ICD-10-CM

## 2013-07-07 DIAGNOSIS — G8929 Other chronic pain: Secondary | ICD-10-CM

## 2013-07-07 MED ORDER — TRAZODONE HCL 150 MG PO TABS: 150.0000 mg | ORAL_TABLET | Freq: Every evening | ORAL | Status: DC | PRN

## 2013-07-07 MED ORDER — TRAMADOL HCL 50 MG PO TABS: ORAL_TABLET | ORAL | Status: DC

## 2013-07-07 MED ORDER — GABAPENTIN 800 MG PO TABS: ORAL_TABLET | ORAL | Status: DC

## 2013-07-07 MED ORDER — FERROUS SULFATE 325 (65 FE) MG PO TABS: 325.0000 mg | ORAL_TABLET | Freq: Two times a day (BID) | ORAL | Status: DC

## 2013-07-07 MED ORDER — SIMVASTATIN 40 MG PO TABS: 40.0000 mg | ORAL_TABLET | Freq: Every day | ORAL | Status: DC

## 2013-07-07 NOTE — Progress Notes (Signed)
   Subjective:    Patient ID: Willie Ward, male    DOB: June 01, 1940, 73 y.o.   MRN: [id number]  HPI Here for montly follow-up for chronic back pain. He did have some recent x-rays done May 21 at the [place]. It showed degenerative changes and demineralization as well as degenerative disc disease at C5-7. 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  Iron deficiency - He is off iron but plans on getting a new Rx for this.   Vit D - def. Has been off the supplement since last in hospitalization.   Schedule for MRI of spine later this week. He is on Cymbalta for mood and back pain.    He also notes difficulty swallowing. He had an esophageal dilatation done about 4 or 5 years ago. More recently he says that his pills are getting stuck in his throat when he tries to swallow. The symptoms are similar to what he had previously. Review of Systems     Objective:   Physical Exam  Constitutional: He is oriented to person, place, and time. He appears well-developed and well-nourished.  HENT:  Head: Normocephalic and atraumatic.  Cardiovascular: Normal rate, regular rhythm and normal heart sounds.   Pulmonary/Chest: Effort normal and breath sounds normal.  Neurological: He is alert and oriented to person, place, and time.  Skin: Skin is warm and dry.  Psychiatric: He has a normal mood and affect. His behavior is normal.          Assessment & Plan:  Chronic back pain-  he is evidently getting scheduled for possible MRI later this week to further evaluate his spine. He is currently using tramadol for pain control. He did test positive for cannabinoids recently. We'll continue with Cymbalta which I do think this helping to control his pain daily. Refilled tramadol today.    Iron deficiency-recheck iron in 3-4 months. Has to pick up description for the iron.  Vit D def - Has been off his vitamin D since was last in hospital.  He run a copy of recent lab  work from the [place] in his vitamin D level was 36. Continue just with multivitamin that has extra vitamin D.  Blood vessel calcification seen on his x-ray of the lumbar spine. Recommend a statin. Discussed the pros and cons and potential side effects. Will start with simvastatin 40 mg at bedtime. Call if any problems. Can repeat liver and lipids at followup visit. Lab Results  Component Value Date   CHOL 197 12/19/2012   HDL 68 12/19/2012   LDLCALC 109 12/19/2012   TRIG 98 12/19/2012   CHOLHDL 3.3 09/18/2012   Dysphagia-with known history of prior esophageal stricture. Last dilatation was about 4-5 years ago. Recommend referral to GI for repeat endoscopy and possible dilatation again. He says he wants to hold off on referral until he sees you back next month.

## 2013-07-25 LAB — VITAMIN D 25 HYDROXY (VIT D DEFICIENCY, FRACTURES): Vit D, 25-Hydroxy: 14.15

## 2013-07-29 ENCOUNTER — Encounter: Payer: Self-pay | Admitting: Family Medicine

## 2013-07-29 ENCOUNTER — Telehealth: Payer: Self-pay | Admitting: Family Medicine

## 2013-07-29 NOTE — Telephone Encounter (Signed)
Pt will be out of his medication by time of his appt on 7/7. Thank you

## 2013-07-30 MED ORDER — TRAMADOL HCL 50 MG PO TABS: ORAL_TABLET | ORAL | Status: DC

## 2013-07-30 NOTE — Telephone Encounter (Signed)
rx printed ok to fill on 7/1.Willie Ward

## 2013-08-04 ENCOUNTER — Ambulatory Visit: Payer: Self-pay | Admitting: Family Medicine

## 2013-08-11 ENCOUNTER — Ambulatory Visit: Payer: Self-pay | Admitting: Family Medicine

## 2013-08-21 ENCOUNTER — Encounter: Payer: Self-pay | Admitting: Family Medicine

## 2013-08-21 ENCOUNTER — Ambulatory Visit (INDEPENDENT_AMBULATORY_CARE_PROVIDER_SITE_OTHER): Payer: Medicare Other | Admitting: Family Medicine

## 2013-08-21 VITALS — BP 139/76 | HR 120 | Wt 130.0 lb

## 2013-08-21 DIAGNOSIS — R1314 Dysphagia, pharyngoesophageal phase: Secondary | ICD-10-CM

## 2013-08-21 DIAGNOSIS — F411 Generalized anxiety disorder: Secondary | ICD-10-CM

## 2013-08-21 MED ORDER — ACLIDINIUM BROMIDE 400 MCG/ACT IN AEPB: 1.0000 | INHALATION_SPRAY | Freq: Two times a day (BID) | RESPIRATORY_TRACT | Status: AC

## 2013-08-21 MED ORDER — ALBUTEROL SULFATE HFA 108 (90 BASE) MCG/ACT IN AERS: 2.0000 | INHALATION_SPRAY | Freq: Four times a day (QID) | RESPIRATORY_TRACT | Status: AC | PRN

## 2013-08-21 MED ORDER — DULOXETINE HCL 60 MG PO CPEP: 60.0000 mg | ORAL_CAPSULE | Freq: Every day | ORAL | Status: DC

## 2013-08-21 MED ORDER — TRAZODONE HCL 150 MG PO TABS: 150.0000 mg | ORAL_TABLET | Freq: Every evening | ORAL | Status: AC | PRN

## 2013-08-21 MED ORDER — METOPROLOL SUCCINATE ER 25 MG PO TB24
25.0000 mg | ORAL_TABLET | Freq: Every day | ORAL | Status: AC
Start: 2013-08-21 — End: ?

## 2013-08-21 MED ORDER — DULOXETINE HCL 30 MG PO CPEP: 30.0000 mg | ORAL_CAPSULE | Freq: Every day | ORAL | Status: DC

## 2013-08-21 MED ORDER — SIMVASTATIN 40 MG PO TABS: 40.0000 mg | ORAL_TABLET | Freq: Every day | ORAL | Status: AC

## 2013-08-21 MED ORDER — FLUTICASONE FUROATE-VILANTEROL 100-25 MCG/INH IN AEPB: 1.0000 | INHALATION_SPRAY | Freq: Every day | RESPIRATORY_TRACT | Status: AC

## 2013-08-21 MED ORDER — HYDROXYZINE HCL 25 MG PO TABS: 25.0000 mg | ORAL_TABLET | Freq: Three times a day (TID) | ORAL | Status: DC | PRN

## 2013-08-21 NOTE — Progress Notes (Signed)
   Subjective:    Patient ID: Willie Ward, male    DOB: October 08, 1940, 73 y.o.   MRN: [id number]  HPI He feels like his dysphagia is getting worse. Now happening even with fluids such as soda. He thinks he is ready for referral to GI.  Last dilatation was about 4-5 years ago.   He did bring in a copy of her recent bone scan that was done through the [place]. No evidence of infection trauma or metastasis. He was noted to have some scoliosis and arthritis especially in the right great toe and the knee joint. He also had cold spot near the thoracolumbar spine as well as on the left fifth and sixth ribs were he has had a rib resection. This was performed on 07/09/2013.  She's also asking for something for his anxiety today. He just feels like his friends are getting on his nerves and he feels really anxious. He had been doing very well for quite a while. He says he's been taking his Cymbalta. He also says he is getting a medication from the [place] for anxiety but I'm not sure what it is. Review of Systems     Objective:   Physical Exam  Constitutional: He is oriented to person, place, and time. He appears well-developed and well-nourished.  HENT:  Head: Normocephalic and atraumatic.  Cardiovascular: Normal rate, regular rhythm and normal heart sounds.   Pulmonary/Chest: Effort normal and breath sounds normal.  Neurological: He is alert and oriented to person, place, and time.  Skin: Skin is warm and dry.  Psychiatric: He has a normal mood and affect. His behavior is normal.          Assessment & Plan:  Dysphagia-with known history of prior esophageal stricture. Last dilatation was about 4-5 years ago. Recommend referral to GI for repeat endoscopy and possible dilatation again.  Anxiety-will give him a prescription for Vistaril to use as needed. That can be somewhat sedating but I really want to stay away from benzodiazepines because of his history of abuse iand seizure from benzodiazepine  withdrawal.  Also recommend counseling. He declined at this time.   Normal bone scan. Wil scan to chart.  Not clear to me exactly why he had this done. He's not sure if there. When I last saw him he actually said he was getting an MRI done on his back which is to have not seen the report on. He does have a prior history of prostate cancer but his last PSA was extremely low.

## 2013-08-24 ENCOUNTER — Telehealth: Payer: Self-pay | Admitting: *Deleted

## 2013-08-24 ENCOUNTER — Telehealth: Payer: Self-pay

## 2013-08-24 NOTE — Telephone Encounter (Signed)
Pt called and informed of med type.Willie Ward

## 2013-08-24 NOTE — Telephone Encounter (Signed)
It is not an antidepressant but is an anthistamine. He  Is already getting something from the [place].

## 2013-08-24 NOTE — Telephone Encounter (Signed)
Hydroxyzine HCL has been approved for 12 months. Until 08/22/2014. [facility]

## 2013-08-24 NOTE — Telephone Encounter (Signed)
Pt stated that he cannot take the Vistaril because of it being an antidepressant and makes him feel really bad. Would like to know if she can send something else in for him to take.Audelia Hives [place]

## 2013-08-31 ENCOUNTER — Other Ambulatory Visit: Payer: Self-pay | Admitting: Family Medicine

## 2013-09-22 ENCOUNTER — Ambulatory Visit (INDEPENDENT_AMBULATORY_CARE_PROVIDER_SITE_OTHER): Payer: Medicare Other | Admitting: Family Medicine

## 2013-09-22 ENCOUNTER — Encounter: Payer: Self-pay | Admitting: Family Medicine

## 2013-09-22 VITALS — BP 125/79 | HR 76 | Wt 130.0 lb

## 2013-09-22 DIAGNOSIS — J449 Chronic obstructive pulmonary disease, unspecified: Secondary | ICD-10-CM

## 2013-09-22 DIAGNOSIS — F411 Generalized anxiety disorder: Secondary | ICD-10-CM

## 2013-09-22 DIAGNOSIS — H269 Unspecified cataract: Secondary | ICD-10-CM

## 2013-09-22 DIAGNOSIS — Z23 Encounter for immunization: Secondary | ICD-10-CM

## 2013-09-22 DIAGNOSIS — D509 Iron deficiency anemia, unspecified: Secondary | ICD-10-CM

## 2013-09-22 DIAGNOSIS — R1310 Dysphagia, unspecified: Secondary | ICD-10-CM

## 2013-09-22 MED ORDER — TRAMADOL HCL 50 MG PO TABS: ORAL_TABLET | ORAL | Status: DC

## 2013-09-22 NOTE — Patient Instructions (Signed)
Recommend Zaditor for allergic eye symptoms.  It is over the counter Go for your lab in about 1 week. You don't have to fast.

## 2013-09-22 NOTE — Progress Notes (Signed)
   Subjective:    Patient ID: Willie Ward, male    DOB: 1940/05/25, 73 y.o.   MRN: [id number]  HPI Dysphagia - says did get an appt.  He feels like it is getting worse and says feels like his voice is changing.  Occ some CP at night.  He is prilosec.    COPD- He is on [place] to the [place] and saw a psychiatrist there.  He really like him. They inc his cymbalta to 60mg  BID.  Still uses his oxygean at home and sleep with it.    Iron deficienecy - He has been on his iron for about 5 weeks.   Review of Systems     Objective:   Physical Exam  Constitutional: He is oriented to person, place, and time. He appears well-developed and well-nourished.  HENT:  Head: Normocephalic and atraumatic.  Right Ear: External ear normal.  Left Ear: External ear normal.  Nose: Nose normal.  Mouth/Throat: Oropharynx is clear and moist.  Eyes: Conjunctivae and EOM are normal. Pupils are equal, round, and reactive to light.  Neck: Neck supple. No thyromegaly present.  Cardiovascular: Normal rate and normal heart sounds.   Pulmonary/Chest: Effort normal and breath sounds normal.  Lymphadenopathy:    He has no cervical adenopathy.  Neurological: He is alert and oriented to person, place, and time.  Skin: Skin is warm and dry.  Psychiatric: He has a normal mood and affect.          Assessment & Plan:  Dysphagia-with known history of prior esophageal stricture. Last dilatation was about 4-5 years ago. He has an appt in September  COPD - Stable.  No change to regimen. No recent flares. Given flu vaccine today. .    Iron def anemia - in one week will recheck levels.    Allergic Eye- Recommend OTC Zaditor.  Also recommend full eye exam since last was over a a year ago.   GAD - now seeing psych at the [place].  Doing well on inc dose of Cymbalta 60mg  BID.

## 2013-10-15 ENCOUNTER — Ambulatory Visit (INDEPENDENT_AMBULATORY_CARE_PROVIDER_SITE_OTHER): Payer: Medicare Other | Admitting: Family Medicine

## 2013-10-15 ENCOUNTER — Encounter: Payer: Self-pay | Admitting: Family Medicine

## 2013-10-15 VITALS — BP 109/69 | HR 122 | Wt 131.0 lb

## 2013-10-15 DIAGNOSIS — Z23 Encounter for immunization: Secondary | ICD-10-CM

## 2013-10-15 DIAGNOSIS — Z0000 Encounter for general adult medical examination without abnormal findings: Secondary | ICD-10-CM

## 2013-10-15 DIAGNOSIS — R1314 Dysphagia, pharyngoesophageal phase: Secondary | ICD-10-CM

## 2013-10-15 DIAGNOSIS — R079 Chest pain, unspecified: Secondary | ICD-10-CM

## 2013-10-15 NOTE — Addendum Note (Signed)
Addended by: Teddy Spike on: 10/15/2013 02:17 PM   Modules accepted: Orders

## 2013-10-15 NOTE — Progress Notes (Signed)
Subjective:    Willie Ward is a 73 y.o. male who presents for Medicare Annual/Subsequent preventive examination.   Preventive Screening-Counseling & Management  Tobacco History  Smoking status  . Former Smoker -- 2.00 packs/day  . Types: Cigarettes  . Quit date: 05/06/2013  Smokeless tobacco  . Not on file    Problems Prior to Visit 1.   Current Problems (verified) Patient Active Problem List   Diagnosis Date Noted  . Atherosclerosis 07/07/2013  . Weakness 01/17/2013  . Chest pain 01/17/2013  . Dehydration with hyponatremia 01/17/2013  . Acute on chronic renal failure 01/17/2013  . Chronic back pain   . Shortness of breath   . Acute pulmonary embolism 12/12/2012  . Narcotic abuse 12/01/2012  . Marijuana dependence 09/10/2012  . Fracture of thumb, left, closed 01/07/2012  . History of seizures 12/27/2011  . Cervical disc disease 08/10/2011  . Neuropathy 06/26/2011  . Alcohol dependence 06/26/2011  . Protein-calorie malnutrition, mild 04/11/2011  . COPD, severe 03/12/2011  . Tobacco abuse 03/12/2011  . Adenocarcinoma of prostate 11/29/2010  . Cataract 11/07/2010  . Insomnia 09/20/2010  . Essential hypertension, benign 09/20/2010  . Chronic low back pain 08/24/2010  . History of prostate cancer 08/24/2010  . Generalized anxiety disorder 08/24/2010    Medications Prior to Visit Current Outpatient Prescriptions on File Prior to Visit  Medication Sig Dispense Refill  . Aclidinium Bromide (TUDORZA PRESSAIR) 400 MCG/ACT AEPB Inhale 1 puff into the lungs 2 (two) times daily.  1 each  3  . albuterol (PROAIR HFA) 108 (90 BASE) MCG/ACT inhaler Inhale 2 puffs into the lungs every 6 (six) hours as needed for wheezing or shortness of breath.  1 Inhaler  2  . AMBULATORY NON FORMULARY MEDICATION Medication Name: overnight oxymetry for SOB and pulmonary emboli.  Uses Gentiva.  1 Units  0  . AMBULATORY NON FORMULARY MEDICATION Anti tip bars for wheel chair and wheelchair  pillow/pad  Diagnosis:Falls E888.9, COPD 491.21 and Chronic back pain 724.5 - 338.29  Fax to [phone]  1 each  0  . AMBULATORY NON FORMULARY MEDICATION Medication Name: Home oxygen set at 2 L. With humidifier. Diagnosis of COPD, pulmonary embolism, hypoxemia.  1 vial  0  . DULoxetine (CYMBALTA) 60 MG capsule Take 60 mg by mouth 2 (two) times daily.      . ferrous sulfate 325 (65 FE) MG tablet Take 1 tablet (325 mg total) by mouth 2 (two) times daily with a meal.  60 tablet  3  . Fluticasone Furoate-Vilanterol (BREO ELLIPTA) 100-25 MCG/INH AEPB Inhale 1 Inhaler into the lungs daily.  28 each  5  . folic acid (FOLVITE) 1 MG tablet Take 1 mg by mouth daily.      Marland Kitchen gabapentin (NEURONTIN) 800 MG tablet TAKE 1 TABLET BY MOUTH THREE TIMES DAILY  90 tablet  6  . hydrOXYzine (ATARAX/VISTARIL) 25 MG tablet Take 1-2 tablets (25-50 mg total) by mouth 3 (three) times daily as needed.  45 tablet  0  . metoprolol succinate (TOPROL-XL) 25 MG 24 hr tablet Take 1 tablet (25 mg total) by mouth daily. Take with or immediately following a meal.  30 tablet  6  . nitroGLYCERIN (NITROSTAT) 0.4 MG SL tablet Place 0.4 mg under the tongue every 5 (five) minutes as needed for chest pain.      Marland Kitchen omeprazole (PRILOSEC) 20 MG capsule Take 20 mg by mouth daily.      . simvastatin (ZOCOR) 40 MG tablet Take 1 tablet (  40 mg total) by mouth at bedtime.  30 tablet  3  . traMADol (ULTRAM) 50 MG tablet TAKE 1 TABLET BY MOUTH THREE TIMES DAILY  90 tablet  0  . traZODone (DESYREL) 150 MG tablet Take 1-2 tablets (150-300 mg total) by mouth at bedtime as needed for sleep. TAKE 1 TABLET BY MOUTH AT BEDTIME  45 tablet  6   No current facility-administered medications on file prior to visit.    Current Medications (verified) Current Outpatient Prescriptions  Medication Sig Dispense Refill  . Aclidinium Bromide (TUDORZA PRESSAIR) 400 MCG/ACT AEPB Inhale 1 puff into the lungs 2 (two) times daily.  1 each  3  . albuterol (PROAIR HFA) 108  (90 BASE) MCG/ACT inhaler Inhale 2 puffs into the lungs every 6 (six) hours as needed for wheezing or shortness of breath.  1 Inhaler  2  . AMBULATORY NON FORMULARY MEDICATION Medication Name: overnight oxymetry for SOB and pulmonary emboli.  Uses Gentiva.  1 Units  0  . AMBULATORY NON FORMULARY MEDICATION Anti tip bars for wheel chair and wheelchair pillow/pad  Diagnosis:Falls E888.9, COPD 491.21 and Chronic back pain 724.5 - 338.29  Fax to [phone]  1 each  0  . AMBULATORY NON FORMULARY MEDICATION Medication Name: Home oxygen set at 2 L. With humidifier. Diagnosis of COPD, pulmonary embolism, hypoxemia.  1 vial  0  . DULoxetine (CYMBALTA) 60 MG capsule Take 60 mg by mouth 2 (two) times daily.      . ferrous sulfate 325 (65 FE) MG tablet Take 1 tablet (325 mg total) by mouth 2 (two) times daily with a meal.  60 tablet  3  . Fluticasone Furoate-Vilanterol (BREO ELLIPTA) 100-25 MCG/INH AEPB Inhale 1 Inhaler into the lungs daily.  28 each  5  . folic acid (FOLVITE) 1 MG tablet Take 1 mg by mouth daily.      Marland Kitchen gabapentin (NEURONTIN) 800 MG tablet TAKE 1 TABLET BY MOUTH THREE TIMES DAILY  90 tablet  6  . hydrOXYzine (ATARAX/VISTARIL) 25 MG tablet Take 1-2 tablets (25-50 mg total) by mouth 3 (three) times daily as needed.  45 tablet  0  . metoprolol succinate (TOPROL-XL) 25 MG 24 hr tablet Take 1 tablet (25 mg total) by mouth daily. Take with or immediately following a meal.  30 tablet  6  . nitroGLYCERIN (NITROSTAT) 0.4 MG SL tablet Place 0.4 mg under the tongue every 5 (five) minutes as needed for chest pain.      Marland Kitchen omeprazole (PRILOSEC) 20 MG capsule Take 20 mg by mouth daily.      . simvastatin (ZOCOR) 40 MG tablet Take 1 tablet (40 mg total) by mouth at bedtime.  30 tablet  3  . traMADol (ULTRAM) 50 MG tablet TAKE 1 TABLET BY MOUTH THREE TIMES DAILY  90 tablet  0  . traZODone (DESYREL) 150 MG tablet Take 1-2 tablets (150-300 mg total) by mouth at bedtime as needed for sleep. TAKE 1 TABLET BY MOUTH  AT BEDTIME  45 tablet  6   No current facility-administered medications for this visit.     Allergies (verified) Benzodiazepines; Liver; and Tape   PAST HISTORY  Family History Family History  Problem Relation Age of Onset  . Heart attack Mother   . Depression Son   . Diabetes Sister   . Hypertension Mother   . Hypertension Brother     Social History History  Substance Use Topics  . Smoking status: Former Smoker -- 2.00 packs/day  Types: Cigarettes    Quit date: 05/06/2013  . Smokeless tobacco: Not on file  . Alcohol Use: No     Comment: Stopped    Are there smokers in your home (other than you)?  No  Risk Factors Current exercise habits: The patient does not participate in regular exercise at present.  Dietary issues discussed: None   Cardiac risk factors: advanced age (older than 59 for men, 38 for women), hypertension and male gender.  Depression Screen (Note: if answer to either of the following is "Yes", a more complete depression screening is indicated)   Q1: Over the past two weeks, have you felt down, depressed or hopeless? No  Q2: Over the past two weeks, have you felt little interest or pleasure in doing things? Yes  Have you lost interest or pleasure in daily life? No  Do you often feel hopeless? No  Do you cry easily over simple problems? No  Activities of Daily Living In your present state of health, do you have any difficulty performing the following activities?:  Driving? No Managing money?  No Feeding yourself? No Getting from bed to chair? No Climbing a flight of stairs? No Preparing food and eating?: No Bathing or showering? No Getting dressed: No Getting to the toilet? No Using the toilet:No Moving around from place to place: No In the past year have you fallen or had a near fall?:Yes   Are you sexually active?  No  Do you have more than one partner?  No  Hearing Difficulties: Yes Do you often ask people to speak up or repeat  themselves? Yes Do you experience ringing or noises in your ears? No Do you have difficulty understanding soft or whispered voices? No   Do you feel that you have a problem with memory? Yes  Do you often misplace items? Yes  Do you feel safe at home?  Yes  Cognitive Testing  Alert? Yes  Normal Appearance?Yes  Oriented to person? Yes  Place? Yes   Time? Yes  Recall of three objects?  Yes  Can perform simple calculations? Yes  Displays appropriate judgment?Yes  Can read the correct time from a watch face?Yes   6CIT score of 8/28 (abnormal)     Advanced Directives have been discussed with the patient? Yes   List the Names of Other Physician/Practitioners you currently use: 1.    Indicate any recent Medical Services you may have received from other than [facility] providers in the past year (date may be approximate).  Immunization History  Administered Date(s) Administered  . Influenza Split 10/30/2010, 11/02/2011  . Influenza,inj,Quad PF,36+ Mos 12/12/2012, 09/22/2013  . Pneumococcal Polysaccharide-23 12/01/2010  . Tdap 10/30/2010    Screening Tests Health Maintenance  Topic Date Due  . Influenza Vaccine  09/06/2014  . Colonoscopy  03/20/2016  . Tetanus/tdap  10/29/2020  . Pneumococcal Polysaccharide Vaccine Age 26 And Over  Completed  . Zostavax  Addressed    All answers were reviewed with the patient and necessary referrals were made:  Ryle Buscemi, MD   10/15/2013   History reviewed: allergies, current medications, past family history, past medical history, past social history, past surgical history and problem list  Review of Systems A comprehensive review of systems was negative.    Objective:     Vision by Snellen chart: see eye charge There were no vitals taken for this visit. There is no weight on file to calculate BMI.  There were no vitals taken for this visit. General appearance:  alert, cooperative and appears stated age Head: Normocephalic,  without obvious abnormality, atraumatic Eyes: conj clear, EOMI, PERLA Ears: normal TM's and external ear canals both ears Nose: Nares normal. Septum midline. Mucosa normal. No drainage or sinus tenderness. Throat: lips, mucosa, and tongue normal; teeth and gums normal Neck: no adenopathy, no carotid bruit, no JVD, supple, symmetrical, trachea midline and thyroid not enlarged, symmetric, no tenderness/mass/nodules Back: symmetric, no curvature. ROM normal. No CVA tenderness. Lungs: clear to auscultation bilaterally Chest wall: no tenderness Heart: regular rate and rhythm, S1, S2 normal, no murmur, click, rub or gallop Abdomen: soft, non-tender; bowel sounds normal; no masses,  no organomegaly Extremities: extremities normal, atraumatic, no cyanosis or edema Pulses: 2+ and symmetric Skin: Skin color, texture, turgor normal. No rashes or lesions Lymph nodes: Cervical, supraclavicular, and axillary nodes normal. Neurologic: Alert and oriented X 3, normal strength and tone. Normal symmetric reflexes. Normal coordination and gait     Assessment:     Annual Wellness Exam - Medicare     Plan:     During the course of the visit the patient was educated and counseled about appropriate screening and preventive services including:    Influenza vaccine given today.  Prevnar 13.   Will schedule OV for memory testing.   Dysphagia - called to get him sooner appt.  He feels he is getting worse and voice is getting hoarse. He say he is not drinking or smoking.  They will see him this afternoon.    I did take his nitroglycerin twice last week. He said both times he had an episode of midsternal chest pain. He felt like it was similar to heartburn. He belched but did not get any relief and then took his nitroglycerin and within 5 minutes got relief. These 2 episodes were not on the same day. He's not had any symptoms since. No shortness of breath or chest pain with activity. EKG today shows rate of  118 beats per minute, normal sinus rhythm with normal axis, no acute ST-T wave changes.  Unchanged from 2014.    Diet review for nutrition referral? Yes ____  Not Indicated _X_   Patient Instructions (the written plan) was given to the patient.  Medicare Attestation I have personally reviewed: The patient's medical and social history Their use of alcohol, tobacco or illicit drugs Their current medications and supplements The patient's functional ability including ADLs,fall risks, home safety risks, cognitive, and hearing and visual impairment Diet and physical activities Evidence for depression or mood disorders  The patient's weight, height, BMI, and visual acuity have been recorded in the chart.  I have made referrals, counseling, and provided education to the patient based on review of the above and I have provided the patient with a written personalized care plan for preventive services.     Mateja Dier, MD   10/15/2013

## 2013-10-15 NOTE — Patient Instructions (Signed)
Keep up a regular exercise program and make sure you are eating a healthy diet Try to eat 4 servings of dairy a day, or if you are lactose intolerant take a calcium with vitamin D daily.  Your vaccines are up to date.   

## 2013-10-20 ENCOUNTER — Ambulatory Visit: Payer: Self-pay | Admitting: Family Medicine

## 2013-10-27 ENCOUNTER — Telehealth: Payer: Self-pay | Admitting: Family Medicine

## 2013-10-27 NOTE — Telephone Encounter (Signed)
Patient called to cancel his memory testing that was scheduled for 11/17/13 because he said he doesn't need his memory tested. I am not sure if this test was important or not but just giving a heads up for the reason he cancelled the appointment. Thanks

## 2013-10-28 ENCOUNTER — Other Ambulatory Visit: Payer: Self-pay | Admitting: Family Medicine

## 2013-11-06 ENCOUNTER — Telehealth: Payer: Self-pay | Admitting: *Deleted

## 2013-11-06 MED ORDER — BUSPIRONE HCL 7.5 MG PO TABS: 7.5000 mg | ORAL_TABLET | Freq: Two times a day (BID) | ORAL | Status: DC | PRN

## 2013-11-06 NOTE — Telephone Encounter (Signed)
The [facility] office called with concerns about Willie Ward. She said that he has called their office 3 times within the last hour demanding xanax for his nerves. He was just diagnosed with laryngeal cancer and is having a feeding tube inserted on 11/14/13 and is a nervous wreck. He made implications to the girl that his head was going to "blow" off because he cannot take it anymore. He is not suicidal just a basket of nerves. I am going to call patient but I needed to know if a script could be wrote out for him that I can make one phone call to ease his mind? He is scheduled to see you on 11/11/13. Please advise. Margette Fast, CMA

## 2013-11-06 NOTE — Telephone Encounter (Signed)
Willie Ward is aware to pick up buspar at pharmacy and was given direction on using medication. He was also reminded of his f/u appt in office on 11/11/13. He was grateful for medicine. Margette Fast, CMA

## 2013-11-06 NOTE — Telephone Encounter (Signed)
Can use buspar prn. Rx sent. It will help calm his nerves.

## 2013-11-11 ENCOUNTER — Ambulatory Visit (INDEPENDENT_AMBULATORY_CARE_PROVIDER_SITE_OTHER): Payer: Medicare Other | Admitting: Family Medicine

## 2013-11-11 ENCOUNTER — Encounter: Payer: Self-pay | Admitting: Family Medicine

## 2013-11-11 VITALS — BP 132/80 | HR 128 | Ht 71.0 in | Wt 132.0 lb

## 2013-11-11 DIAGNOSIS — R05 Cough: Secondary | ICD-10-CM

## 2013-11-11 DIAGNOSIS — C159 Malignant neoplasm of esophagus, unspecified: Secondary | ICD-10-CM

## 2013-11-11 DIAGNOSIS — R059 Cough, unspecified: Secondary | ICD-10-CM

## 2013-11-11 DIAGNOSIS — Z23 Encounter for immunization: Secondary | ICD-10-CM

## 2013-11-11 DIAGNOSIS — F411 Generalized anxiety disorder: Secondary | ICD-10-CM

## 2013-11-11 MED ORDER — HYDROCODONE-HOMATROPINE 5-1.5 MG/5ML PO SYRP: 5.0000 mL | ORAL_SOLUTION | Freq: Two times a day (BID) | ORAL | Status: AC | PRN

## 2013-11-11 NOTE — Progress Notes (Signed)
   Subjective:    Patient ID: Willie Ward, male    DOB: 02-20-1940, 73 y.o.   MRN: [id number]  HPI F/U new dx of probably esophageal carcinoma. Sheduled for bx and PEG tube placement on Friday. This week. Treatment will likely be palliative.  Overall he says is doing okay with his anxiety levels are high. In fact he called here earlier and we called in a prescription for BuSpar. He says they're far it actually has been working fantastic for him. He denies any negative side effects from the medication. Is this concern at this time is a chronic cough. He says he coughs so much that it causes his throat to her. It also keeping him awake at night.  He is having a hard time swallowing his meds so has had to start crushing them.    Review of Systems     Objective:   Physical Exam  Constitutional: He appears well-developed and well-nourished.  Skin: Skin is warm and dry.  Psychiatric: He has a normal mood and affect. His behavior is normal.          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 Prescription cough medicine given to help suppress cough. Warned only to use BID.  Encouraged him to get a pill crusher. I will call the pharmacy and see if we can change anything to liquid to help him.    Anxiety/panic disorder-doing well on BuSpar.. Try to avoid benzodiazepines if possible.  f/U in 1 month Can adjust dose if needed at that time.

## 2013-11-17 ENCOUNTER — Other Ambulatory Visit: Payer: Self-pay

## 2013-11-17 ENCOUNTER — Ambulatory Visit: Payer: Self-pay | Admitting: Family Medicine

## 2013-11-17 NOTE — Telephone Encounter (Signed)
I spoke with the pharmacist at [facility] to find out what medications can be crushed.  Buspar - Can be crushed Hydroxyzine -  Can be crushed Omeprazole - Can be crushed Simvastatin - Can be crushed Tramadol -  Can be crushed Trazodone - Can be crushed Metoprolol has to be switched to Tartrate instead of Succinate then - Can be crushed Gabapentin has to be switched to capsules or solution  Ferrous Sulfate can be switched to solution Cymbalta can not be crushed this medication will have to be switched if he is unable to swallow

## 2013-11-18 MED ORDER — FERROUS SULFATE 300 (60 FE) MG/5ML PO SYRP: 300.0000 mg | ORAL_SOLUTION | Freq: Two times a day (BID) | ORAL | Status: AC

## 2013-11-18 MED ORDER — METOPROLOL TARTRATE 25 MG PO TABS: 25.0000 mg | ORAL_TABLET | Freq: Two times a day (BID) | ORAL | Status: AC

## 2013-11-18 MED ORDER — GABAPENTIN 400 MG PO CAPS: 800.0000 mg | ORAL_CAPSULE | Freq: Three times a day (TID) | ORAL | Status: AC

## 2013-12-04 ENCOUNTER — Telehealth: Payer: Self-pay | Admitting: Family Medicine

## 2013-12-04 ENCOUNTER — Other Ambulatory Visit: Payer: Self-pay

## 2013-12-04 MED ORDER — BUSPIRONE HCL 7.5 MG PO TABS: 7.5000 mg | ORAL_TABLET | Freq: Two times a day (BID) | ORAL | Status: AC | PRN

## 2013-12-04 NOTE — Telephone Encounter (Signed)
Refill sent to pharmacy.   

## 2013-12-04 NOTE — Telephone Encounter (Signed)
Pt walked in because he does not have a phone and needs his Buspar called in to [facility] on [address] in [place]

## 2013-12-09 ENCOUNTER — Ambulatory Visit: Payer: Self-pay | Admitting: Family Medicine

## 2013-12-21 ENCOUNTER — Telehealth: Payer: Self-pay | Admitting: *Deleted

## 2013-12-21 NOTE — Telephone Encounter (Addendum)
Called and stated that pt was in Hospital for sm. Bowel obstruction on 12/12/13. She visited him on Saturday and pt informed her that he is no longer using the G tube for feeding he only flushes it out w/water.Willie Ward

## 2014-01-07 ENCOUNTER — Telehealth: Payer: Self-pay

## 2014-01-07 NOTE — Telephone Encounter (Addendum)
Willie Ward's niece, Willie Ward, called and states he is very anxious now that he has decided against treatment. Dr Vevelyn Royals has ordered hospice for patient. It will take a couple of weeks before they are set up in the home. She was wanting a prescription of xanax 1 mg for 1 month. Please advise.   Deb [phone]

## 2014-01-11 NOTE — Telephone Encounter (Signed)
[facility] for 1mg  QD prn. #30 tabs. No refills.  He has a hx of seizures on benzos so want to keep quantity to a minimum

## 2014-01-12 MED ORDER — ALPRAZOLAM 1 MG PO TABS: 1.0000 mg | ORAL_TABLET | Freq: Every day | ORAL | Status: AC | PRN

## 2014-01-12 NOTE — Telephone Encounter (Signed)
Prescription sent. Left message stating this on his niece's voicemail.

## 2014-03-08 DEATH — deceased
